# Patient Record
Sex: Male | Born: 1945 | Race: White | Hispanic: No | Marital: Married | Smoking: Former smoker
Health system: Southern US, Community
[De-identification: ages and names within clinical notes are randomized; demographics above are authoritative.]

## PROBLEM LIST (undated history)

## (undated) DIAGNOSIS — E781 Pure hyperglyceridemia: Secondary | ICD-10-CM

## (undated) DIAGNOSIS — K219 Gastro-esophageal reflux disease without esophagitis: Secondary | ICD-10-CM

## (undated) DIAGNOSIS — L03114 Cellulitis of left upper limb: Secondary | ICD-10-CM

## (undated) DIAGNOSIS — E669 Obesity, unspecified: Secondary | ICD-10-CM

## (undated) DIAGNOSIS — R011 Cardiac murmur, unspecified: Secondary | ICD-10-CM

## (undated) DIAGNOSIS — R0989 Other specified symptoms and signs involving the circulatory and respiratory systems: Secondary | ICD-10-CM

## (undated) DIAGNOSIS — Z952 Presence of prosthetic heart valve: Secondary | ICD-10-CM

## (undated) DIAGNOSIS — J8410 Pulmonary fibrosis, unspecified: Secondary | ICD-10-CM

## (undated) DIAGNOSIS — K648 Other hemorrhoids: Secondary | ICD-10-CM

## (undated) DIAGNOSIS — IMO0002 Reserved for concepts with insufficient information to code with codable children: Secondary | ICD-10-CM

## (undated) DIAGNOSIS — I447 Left bundle-branch block, unspecified: Secondary | ICD-10-CM

## (undated) DIAGNOSIS — M1710 Unilateral primary osteoarthritis, unspecified knee: Secondary | ICD-10-CM

## (undated) DIAGNOSIS — M1990 Unspecified osteoarthritis, unspecified site: Secondary | ICD-10-CM

## (undated) DIAGNOSIS — K649 Unspecified hemorrhoids: Secondary | ICD-10-CM

## (undated) DIAGNOSIS — R0609 Other forms of dyspnea: Secondary | ICD-10-CM

## (undated) HISTORY — DX: Other forms of dyspnea: R06.09

## (undated) HISTORY — DX: Left bundle-branch block, unspecified: I44.7

## (undated) HISTORY — DX: Other specified symptoms and signs involving the circulatory and respiratory systems: R09.89

## (undated) HISTORY — DX: Unilateral primary osteoarthritis, unspecified knee: M17.10

## (undated) HISTORY — DX: Pure hyperglyceridemia: E78.1

## (undated) HISTORY — DX: Reserved for concepts with insufficient information to code with codable children: IMO0002

## (undated) HISTORY — DX: Obesity, unspecified: E66.9

## (undated) HISTORY — DX: Presence of prosthetic heart valve: Z95.2

## (undated) HISTORY — PX: JOINT REPLACEMENT: SHX530

## (undated) HISTORY — PX: AORTIC VALVE REPLACEMENT: SHX41

## (undated) HISTORY — PX: OTHER SURGICAL HISTORY: SHX169

## (undated) HISTORY — DX: Other hemorrhoids: K64.8

---

## 2000-09-03 ENCOUNTER — Encounter: Payer: Self-pay | Admitting: Cardiothoracic Surgery

## 2000-09-03 ENCOUNTER — Inpatient Hospital Stay (HOSPITAL_COMMUNITY): Admission: RE | Admit: 2000-09-03 | Discharge: 2000-09-07 | Payer: Self-pay | Admitting: Cardiothoracic Surgery

## 2000-09-03 HISTORY — PX: PACEMAKER REMOVAL: SHX5066

## 2002-02-24 DIAGNOSIS — K648 Other hemorrhoids: Secondary | ICD-10-CM

## 2002-02-24 HISTORY — DX: Other hemorrhoids: K64.8

## 2004-01-13 ENCOUNTER — Emergency Department (HOSPITAL_COMMUNITY): Admission: EM | Admit: 2004-01-13 | Discharge: 2004-01-13 | Payer: Self-pay | Admitting: Emergency Medicine

## 2004-04-08 ENCOUNTER — Ambulatory Visit: Payer: Self-pay | Admitting: Internal Medicine

## 2004-04-29 ENCOUNTER — Ambulatory Visit: Payer: Self-pay | Admitting: Cardiology

## 2004-05-21 ENCOUNTER — Ambulatory Visit: Payer: Self-pay | Admitting: Cardiology

## 2004-06-10 ENCOUNTER — Ambulatory Visit: Payer: Self-pay | Admitting: Internal Medicine

## 2004-07-08 ENCOUNTER — Ambulatory Visit: Payer: Self-pay | Admitting: Internal Medicine

## 2004-08-05 ENCOUNTER — Ambulatory Visit: Payer: Self-pay | Admitting: Internal Medicine

## 2004-09-03 ENCOUNTER — Ambulatory Visit: Payer: Self-pay | Admitting: Cardiology

## 2004-10-01 ENCOUNTER — Ambulatory Visit: Payer: Self-pay | Admitting: Cardiovascular Disease

## 2004-10-01 ENCOUNTER — Ambulatory Visit: Payer: Self-pay | Admitting: Cardiology

## 2004-10-28 ENCOUNTER — Ambulatory Visit: Payer: Self-pay | Admitting: Cardiology

## 2004-11-13 ENCOUNTER — Ambulatory Visit: Payer: Self-pay | Admitting: Internal Medicine

## 2004-11-14 ENCOUNTER — Ambulatory Visit (HOSPITAL_COMMUNITY): Admission: RE | Admit: 2004-11-14 | Discharge: 2004-11-15 | Payer: Self-pay | Admitting: Orthopedic Surgery

## 2004-11-14 HISTORY — PX: SHOULDER ARTHROSCOPY: SHX128

## 2004-11-20 ENCOUNTER — Ambulatory Visit: Payer: Self-pay | Admitting: Cardiology

## 2004-11-26 ENCOUNTER — Ambulatory Visit: Payer: Self-pay | Admitting: Cardiology

## 2004-12-17 ENCOUNTER — Ambulatory Visit: Payer: Self-pay | Admitting: Cardiology

## 2004-12-31 ENCOUNTER — Ambulatory Visit: Payer: Self-pay | Admitting: Cardiology

## 2005-01-14 ENCOUNTER — Ambulatory Visit: Payer: Self-pay | Admitting: Cardiology

## 2005-02-10 ENCOUNTER — Ambulatory Visit: Payer: Self-pay | Admitting: Cardiology

## 2005-02-17 ENCOUNTER — Ambulatory Visit: Payer: Self-pay | Admitting: Internal Medicine

## 2005-03-03 ENCOUNTER — Ambulatory Visit: Payer: Self-pay | Admitting: Internal Medicine

## 2005-04-07 ENCOUNTER — Ambulatory Visit: Payer: Self-pay | Admitting: Cardiology

## 2005-04-21 ENCOUNTER — Ambulatory Visit: Payer: Self-pay | Admitting: Cardiology

## 2005-05-06 ENCOUNTER — Ambulatory Visit: Payer: Self-pay | Admitting: Internal Medicine

## 2005-06-03 ENCOUNTER — Ambulatory Visit: Payer: Self-pay | Admitting: *Deleted

## 2005-07-01 ENCOUNTER — Ambulatory Visit: Payer: Self-pay | Admitting: Cardiology

## 2005-07-22 ENCOUNTER — Ambulatory Visit: Payer: Self-pay

## 2005-08-28 ENCOUNTER — Ambulatory Visit: Payer: Self-pay | Admitting: Cardiology

## 2005-10-09 ENCOUNTER — Ambulatory Visit: Payer: Self-pay | Admitting: Cardiology

## 2005-10-20 ENCOUNTER — Ambulatory Visit: Payer: Self-pay | Admitting: Cardiology

## 2005-11-10 ENCOUNTER — Ambulatory Visit: Payer: Self-pay | Admitting: Cardiology

## 2005-11-21 ENCOUNTER — Ambulatory Visit: Payer: Self-pay | Admitting: Internal Medicine

## 2005-12-11 ENCOUNTER — Ambulatory Visit: Payer: Self-pay | Admitting: Cardiology

## 2005-12-22 ENCOUNTER — Ambulatory Visit: Payer: Self-pay | Admitting: Cardiovascular Disease

## 2005-12-24 ENCOUNTER — Ambulatory Visit (HOSPITAL_COMMUNITY): Admission: RE | Admit: 2005-12-24 | Discharge: 2005-12-24 | Payer: Self-pay | Admitting: Family Medicine

## 2006-01-19 ENCOUNTER — Ambulatory Visit: Payer: Self-pay | Admitting: Cardiology

## 2006-02-02 ENCOUNTER — Ambulatory Visit: Payer: Self-pay | Admitting: Cardiology

## 2006-02-23 ENCOUNTER — Ambulatory Visit: Payer: Self-pay | Admitting: Cardiology

## 2006-03-17 ENCOUNTER — Ambulatory Visit: Payer: Self-pay | Admitting: Cardiology

## 2006-04-06 ENCOUNTER — Ambulatory Visit: Payer: Self-pay | Admitting: Cardiovascular Disease

## 2006-04-15 ENCOUNTER — Ambulatory Visit: Payer: Self-pay

## 2006-05-01 ENCOUNTER — Ambulatory Visit (HOSPITAL_COMMUNITY): Admission: RE | Admit: 2006-05-01 | Discharge: 2006-05-01 | Payer: Self-pay | Admitting: Urology

## 2006-05-01 HISTORY — PX: CIRCUMCISION: SUR203

## 2007-01-27 ENCOUNTER — Ambulatory Visit: Payer: Self-pay | Admitting: Cardiovascular Disease

## 2007-02-01 ENCOUNTER — Ambulatory Visit: Payer: Self-pay | Admitting: Cardiology

## 2007-02-01 ENCOUNTER — Ambulatory Visit (HOSPITAL_COMMUNITY): Admission: RE | Admit: 2007-02-01 | Discharge: 2007-02-01 | Payer: Self-pay | Admitting: Cardiovascular Disease

## 2007-08-25 ENCOUNTER — Ambulatory Visit: Payer: Self-pay | Admitting: Cardiovascular Disease

## 2007-09-01 ENCOUNTER — Ambulatory Visit: Payer: Self-pay | Admitting: Cardiovascular Disease

## 2007-09-01 ENCOUNTER — Encounter (HOSPITAL_COMMUNITY): Admission: RE | Admit: 2007-09-01 | Discharge: 2007-10-01 | Payer: Self-pay | Admitting: Cardiovascular Disease

## 2007-09-30 ENCOUNTER — Ambulatory Visit: Payer: Self-pay | Admitting: Cardiovascular Disease

## 2007-09-30 ENCOUNTER — Ambulatory Visit (HOSPITAL_COMMUNITY): Admission: RE | Admit: 2007-09-30 | Discharge: 2007-09-30 | Payer: Self-pay | Admitting: Cardiovascular Disease

## 2007-10-14 ENCOUNTER — Ambulatory Visit (HOSPITAL_COMMUNITY): Admission: RE | Admit: 2007-10-14 | Discharge: 2007-10-14 | Payer: Self-pay | Admitting: Family Medicine

## 2007-11-04 ENCOUNTER — Inpatient Hospital Stay (HOSPITAL_BASED_OUTPATIENT_CLINIC_OR_DEPARTMENT_OTHER): Admission: RE | Admit: 2007-11-04 | Discharge: 2007-11-04 | Payer: Self-pay | Admitting: Cardiovascular Disease

## 2007-11-04 ENCOUNTER — Ambulatory Visit: Payer: Self-pay | Admitting: Cardiovascular Disease

## 2008-02-07 ENCOUNTER — Encounter: Payer: Self-pay | Admitting: Cardiovascular Disease

## 2008-02-07 ENCOUNTER — Ambulatory Visit: Payer: Self-pay | Admitting: Cardiovascular Disease

## 2008-02-07 ENCOUNTER — Ambulatory Visit: Payer: Self-pay

## 2008-02-18 ENCOUNTER — Ambulatory Visit: Payer: Self-pay | Admitting: Internal Medicine

## 2008-02-18 ENCOUNTER — Ambulatory Visit (HOSPITAL_COMMUNITY): Admission: RE | Admit: 2008-02-18 | Discharge: 2008-02-18 | Payer: Self-pay | Admitting: Cardiovascular Disease

## 2008-11-24 DIAGNOSIS — Z952 Presence of prosthetic heart valve: Secondary | ICD-10-CM | POA: Insufficient documentation

## 2008-11-24 DIAGNOSIS — M1990 Unspecified osteoarthritis, unspecified site: Secondary | ICD-10-CM | POA: Insufficient documentation

## 2008-11-24 DIAGNOSIS — I447 Left bundle-branch block, unspecified: Secondary | ICD-10-CM | POA: Insufficient documentation

## 2008-11-24 DIAGNOSIS — J849 Interstitial pulmonary disease, unspecified: Secondary | ICD-10-CM | POA: Insufficient documentation

## 2008-11-27 ENCOUNTER — Ambulatory Visit: Payer: Self-pay | Admitting: Cardiovascular Disease

## 2009-01-24 ENCOUNTER — Encounter (INDEPENDENT_AMBULATORY_CARE_PROVIDER_SITE_OTHER): Payer: Self-pay | Admitting: *Deleted

## 2009-03-07 ENCOUNTER — Telehealth (INDEPENDENT_AMBULATORY_CARE_PROVIDER_SITE_OTHER): Payer: Self-pay | Admitting: *Deleted

## 2009-03-23 ENCOUNTER — Encounter (INDEPENDENT_AMBULATORY_CARE_PROVIDER_SITE_OTHER): Payer: Self-pay | Admitting: *Deleted

## 2009-10-18 ENCOUNTER — Ambulatory Visit: Payer: Self-pay | Admitting: Cardiovascular Disease

## 2009-10-18 DIAGNOSIS — I10 Essential (primary) hypertension: Secondary | ICD-10-CM | POA: Insufficient documentation

## 2009-12-20 ENCOUNTER — Encounter: Payer: Self-pay | Admitting: Cardiovascular Disease

## 2010-02-12 ENCOUNTER — Telehealth (INDEPENDENT_AMBULATORY_CARE_PROVIDER_SITE_OTHER): Payer: Self-pay | Admitting: *Deleted

## 2010-02-18 ENCOUNTER — Telehealth (INDEPENDENT_AMBULATORY_CARE_PROVIDER_SITE_OTHER): Payer: Self-pay | Admitting: *Deleted

## 2010-04-12 ENCOUNTER — Ambulatory Visit: Payer: Self-pay | Admitting: Cardiovascular Disease

## 2010-06-11 NOTE — Progress Notes (Signed)
  Cath,Echo,Lov faxed to [facility] to [facility] @ [phone] [facility]  February 18, 2010 9:28 AM

## 2010-06-11 NOTE — Progress Notes (Signed)
  Phone Note Other Incoming   Caller: pt Summary of Call: pt having total knee at [facility] and need to hold his coumadin prior to the procedure and pt wants to make sure nothing else needs to be done off the coumadin. will foward for dr Eden Emms review Deliah Goody, RN  February 12, 2010 9:15 AM   Follow-up for Phone Call        Should be ok off coumadin with no lovenox for 4-5 days without lovenox as INR drifts down. Start coumadin back up immediatly post op with lovenox coverage post surgery Follow-up by: Colon Branch, MD, [facility],  February 12, 2010 9:04 PM     Appended Document:  pt aware

## 2010-06-11 NOTE — Assessment & Plan Note (Signed)
Summary: f1y/per spouse call/lg   Primary Provider:  Dr. Blanchard Kelch  CC:  sinus. Pt stopped taken Toprol.  History of Present Illness: Bandy is seen today for F.U of AVR in 1996 he had a cath in 2009 with normal cors.  His weight is up again 12 lbs and he contnues to have poor diet with lots of bread and carbs.  He denies SSCP, dyspnea, edema or syncope.  His INR has been Rx.  We talked about Pradaxa but I did not encourage it due to inability to reverse it.  He stopped taking his Toprol two months ago.  He thinks his palpitations and skipped beats were from Darvocet which was taken off the market.  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 Take 1 Capsule By Mouth Once A Day 2)  Lipitor 80 Mg Tabs (Atorvastatin Calcium) .... Take 1 Tablet By Mouth Once A Day 3)  Niaspan 500 Mg Cr-Tabs (Niacin (Antihyperlipidemic)) .Marland Kitchen.. 1 Tablet By Mouth At Bedtime 4)  Quinapril-Hydrochlorothiazide 20-12.5 Mg Tabs (Quinapril-Hydrochlorothiazide) .... Take 1 Tablet By Mouth Twice A Day 5)  Tricor 145 Mg Tabs (Fenofibrate) .... Take 1 Tablet By Mouth Once A Day 6)  Aspirin 81 Mg Tbec (Aspirin) .... Take One Tablet By Mouth Daily 7)  Multivitamins   Tabs (Multiple Vitamin) .Marland Kitchen.. 1 Tab By Mouth Once Daily 8)  Warfarin Sodium 10 Mg Tabs (Warfarin Sodium) .... As Directed 9)  Mirapex 0.25 Mg Tabs (Pramipexole Dihydrochloride) .... Once Daily  Allergies (verified): No Known Drug Allergies  Past History:  Past Medical History: Last updated: 11-29-08 HYPERTRIGLYCERIDEMIA/SECONDARY TO POOR DIET  (ICD-272.1) AORTIC VALVE REPLACEMENT, HX OF (ICD-V43.3) LEFT BUNDLE BRANCH BLOCK (ICD-426.3) DYSPNEA ON EXERTION (ICD-786.09) OBESITY/CENTRAL (ICD-278.00) ARTHRITIS, KNEE (ZOX-096.04)    Past Surgical History: Last updated: 11-29-08 Circumcision. SURGEON:  Danae Chen, M.D...05/01/2006 Arthroscopy.Left shoulder.. SURGEON:  Loreta Ave, M.D...11/14/2004 Explantation of a pacemaker generator, followed by removal of both atrial and ventricular pacing leads in a patient who had evidence of pacemaker lead crush as well as pocket infection. 09/03/00 Removal of permanent transvenous pacemaker system.Marland KitchenMarland KitchenSURGEON:  Doylene Canning. Ladona Ridgel, M.D.. 09/03/00  Family History: Last updated: 11/29/2008 Father: deceased at 57 of unknown causes Mother: deceased at 90 w/uterine ca  Social History: Last updated: 11-29-08 Tobacco Use - No.  Alcohol Use - yes.Burnard Bunting wine nightly Drug Use - no  Review of Systems       Denies fever, malais, weight loss, blurry vision, decreased visual acuity, cough, sputum, SOB, hemoptysis, pleuritic pain, palpitaitons, heartburn, abdominal pain, melena, lower extremity edema, claudication, or rash.   Vital Signs:  Patient profile:   65 year old male Height:      67 inches Weight:      217 pounds BMI:     34.11 Pulse rate:   69 / minute Resp:     12 per minute BP sitting:   130 / 80  (left arm)  Vitals Entered By: Kem Parkinson (October 18, 2009 9:40 AM)  Physical Exam  General:  Affect appropriate Healthy:  appears stated age HEENT: normal Neck supple  with no adenopathy JVP normal no bruits no thyromegaly Lungs clear with no wheezing and good diaphragmatic motion Heart:  S1/S2 click  no murmur,rub, gallop or click PMI normal Abdomen: benighn, BS positve, no tenderness, no AAA no bruit.  No HSM or HJR Distal pulses intact with no bruits No edema Neuro non-focal Skin warm and dry    Impression & Recommendations:  Problem # 1:  AORTIC VALVE  REPLACEMENT, HX OF (ICD-V43.3) Normal sounding valve and no symptoms.  SBE prophylaxis  Problem # 2:  HYPERTRIGLYCERIDEMIA/SECONDARY TO POOR DIET (ICD-272.1) Low carb diet and weidht loss plan discussed.  He eats way to much "junk" food after 8:00 pm The following medications were removed from the medication list:    Lipitor 80 Mg Tabs (Atorvastatin calcium) .Marland Kitchen... Take 1 tablet by mouth once a day    Niaspan 500 Mg Cr-tabs (Niacin (antihyperlipidemic)) .Marland Kitchen... 1 tablet by mouth at bedtime    Tricor 145 Mg Tabs (Fenofibrate) .Marland Kitchen... Take 1 tablet by mouth once a day His updated medication list for this problem includes:    Lipitor 80 Mg Tabs (Atorvastatin calcium) .Marland Kitchen... Take 1 tablet by mouth once a day    Niaspan 500 Mg Cr-tabs (Niacin (antihyperlipidemic)) .Marland Kitchen... 1 tablet by mouth at bedtime    Tricor 145 Mg Tabs (Fenofibrate) .Marland Kitchen... Take 1 tablet by mouth once a day  Problem # 3:  ESSENTIAL HYPERTENSION, BENIGN (ICD-401.1) Toprol stopped 2 months ago due to dizzyness.  HR and BP ok off BB.  Monitor on ACE  The following medications were removed from the medication list:    Toprol Xl 25 Mg Xr24h-tab (Metoprolol succinate) .Marland Kitchen... 1/2 tablet by mouth twice a day    Lopressor 50 Mg Tabs (Metoprolol tartrate) .Marland Kitchen... Take 1/2 tablet by mouth twice a day    Quinapril-hydrochlorothiazide 20-12.5 Mg Tabs (Quinapril-hydrochlorothiazide) .Marland Kitchen... Take 1 tablet by mouth twice a day His updated medication list for this problem includes:    Quinapril-hydrochlorothiazide 20-12.5 Mg Tabs (Quinapril-hydrochlorothiazide) .Marland Kitchen... Take 1 tablet by mouth twice a day    Aspirin 81 Mg Tbec (Aspirin) .Marland Kitchen... Take one tablet by mouth daily  Problem # 4:  COUMADIN THERAPY (ICD-V58.61) INR's have been Rx with no bleeding problems.  Discussed Pradaxa and we all agree coumadin is better at this time  Other Orders: EKG w/ Interpretation (93000)  Patient Instructions: 1)  Your physician recommends that you schedule a follow-up  appointment in: 6 MONTHS WITH DR Eden Emms 2)  Your physician recommends that you continue on your current medications as directed. Please refer to the Current Medication list given to you today.

## 2010-06-11 NOTE — Assessment & Plan Note (Signed)
Summary: 6  mo f/u ./cy   Primary Provider:  Dr. Blanchard Kelch   History of Present Illness: Dale [name] is seen today for F.U of AVR in 1996 he had a cath in 2009 with normal cors.  His weight is up again 12 lbs and he contnues to have poor diet with lots of bread and carbs.  He denies SSCP, dyspnea, edema or syncope.  His INR has been Rx.  We talked about Pradaxa but I did not encourage it due to inability to reverse it.  He stopped taking his Toprol two months ago.  He thinks his palpitations and skipped beats were from Darvocet which was taken off the market.  He continues to have problems with his knees and will likely need a replacement in the next 3 years  Current Medications (verified): 1)  Celebrex 200 Mg Caps (Celecoxib) .... Take 1 Capsule By Mouth Once A Day 2)  Lipitor 80 Mg Tabs (Atorvastatin Calcium) .... Take 1 Tablet By Mouth Once A Day 3)  Niaspan 500 Mg Cr-Tabs (Niacin (Antihyperlipidemic)) .Marland Kitchen.. 1 Tablet By Mouth At Bedtime 4)  Quinapril-Hydrochlorothiazide 20-12.5 Mg Tabs (Quinapril-Hydrochlorothiazide) .... Take 1 Tablet By Mouth Twice A Day 5)  Tricor 145 Mg Tabs (Fenofibrate) .... Take 1 Tablet By Mouth Once A Day 6)  Aspirin 81 Mg Tbec (Aspirin) .... Take One Tablet By Mouth Daily 7)  Multivitamins   Tabs (Multiple Vitamin) .Marland Kitchen.. 1 Tab By Mouth Once Daily 8)  Warfarin Sodium 10 Mg Tabs (Warfarin Sodium) .... As Directed 9)  Mirapex 0.25 Mg Tabs (Pramipexole Dihydrochloride) .... Once Daily  Allergies: No Known Drug Allergies  Vital Signs:  Patient profile:   65 year old male Height:      67 inches Weight:      205 pounds BMI:     32.22 Pulse rate:   80 / minute Resp:     14 per minute BP sitting:   124 / 76  (left arm)  Vitals Entered By: Kem Parkinson (April 12, 2010 3:24 PM)  Physical Exam  General:  Affect appropriate Healthy:  appears stated age HEENT: normal Neck supple with no adenopathy JVP normal no bruits no thyromegaly Lungs clear with no wheezing  and good diaphragmatic motion Heart:  S1/S2 click with sytolic   murmur,rub, gallop or click PMI normal Abdomen: benighn, BS positve, no tenderness, no AAA no bruit.  No HSM or HJR Distal pulses intact with no bruits No edema Neuro non-focal Skin warm and dry    Impression & Recommendations:  Problem # 1:  ESSENTIAL HYPERTENSION, BENIGN (ICD-401.1) Well contorlled His updated medication list for this problem includes:    Quinapril-hydrochlorothiazide 20-12.5 Mg Tabs (Quinapril-hydrochlorothiazide) .Marland Kitchen... Take 1 tablet by mouth twice a day    Aspirin 81 Mg Tbec (Aspirin) .Marland Kitchen... Take one tablet by mouth daily  Problem # 2:  COUMADIN THERAPY (ICD-V58.61) INR's Rx with no bleeding problems  Followed by primary not our clinic  Problem # 3:  HYPERTRIGLYCERIDEMIA/SECONDARY TO POOR DIET (ICD-272.1) Labs per primary.  NO side effects  Diet much improved with weight loss noted His updated medication list for this problem includes:    Lipitor 80 Mg Tabs (Atorvastatin calcium) .Marland Kitchen... Take 1 tablet by mouth once a day    Niaspan 500 Mg Cr-tabs (Niacin (antihyperlipidemic)) .Marland Kitchen... 1 tablet by mouth at bedtime    Tricor 145 Mg Tabs (Fenofibrate) .Marland Kitchen... Take 1 tablet by mouth once a day  Problem # 4:  AORTIC VALVE REPLACEMENT, HX OF (  ICD-V43.3) Normal sounding valve click  SBE prohyplaxis  Patient Instructions: 1)  Your physician recommends that you schedule a follow-up appointment in: 6 MONTHS WITH DR Eden Emms 2)  Your physician recommends that you continue on your current medications as directed. Please refer to the Current Medication list given to you today. Prescriptions: CELEBREX 200 MG CAPS (CELECOXIB) Take 1 capsule by mouth once a day  #90 x 4   Entered by:   Scherrie Bateman, LPN   Authorized by:   Colon Branch, MD, [facility]   Signed by:   Scherrie Bateman, LPN on 04/54/0981   Method used:   Faxed to ...       [facility] (mail-order)       [address] [place], [place]   [address]       Ph: [phone]       Fax: [phone]   RxID:   [id number] TRICOR 145 MG TABS (FENOFIBRATE) Take 1 tablet by mouth once a day  #90 x 3   Entered by:   Scherrie Bateman, LPN   Authorized by:   Colon Branch, MD, [facility]   Signed by:   Scherrie Bateman, LPN on 36/64/4034   Method used:   Faxed to ...       [facility] (mail-order)       [address] [place], [place]  [address]       Ph: [phone]       Fax: [phone]   RxID:   [id number] QUINAPRIL-HYDROCHLOROTHIAZIDE 20-12.5 MG TABS (QUINAPRIL-HYDROCHLOROTHIAZIDE) Take 1 tablet by mouth twice a day  #180 x 4   Entered by:   Scherrie Bateman, LPN   Authorized by:   Colon Branch, MD, [facility]   Signed by:   Scherrie Bateman, LPN on 32/20/2542   Method used:   Faxed to ...       [facility] (mail-order)       [address] [place], [place]  [address]       Ph: [phone]       Fax: [phone]   RxID:   [id number] NIASPAN 500 MG CR-TABS (NIACIN (ANTIHYPERLIPIDEMIC)) 1 tablet by mouth at bedtime  #90 x 4   Entered by:   Scherrie Bateman, LPN   Authorized by:   Colon Branch, MD, [facility]   Signed by:   Scherrie Bateman, LPN on 81/82/9937   Method used:   Faxed to ...       [facility] (mail-order)       [address] [place], [place]  [address]       Ph: [phone]       Fax: [phone]   RxID:   [id number] LIPITOR 80 MG TABS (ATORVASTATIN CALCIUM) Take 1 tablet by mouth once a day  #90 Tablet x 3   Entered by:   Scherrie Bateman, LPN   Authorized by:   Colon Branch, MD, [facility]   Signed by:   Scherrie Bateman, LPN on 67/61/9509   Method used:   Faxed to ...       [facility] (mail-order)       [address] [place], [place]  [address]       Ph: [phone]       Fax: [phone]   RxID:   [id number]

## 2010-07-31 ENCOUNTER — Other Ambulatory Visit: Payer: Self-pay | Admitting: *Deleted

## 2010-07-31 DIAGNOSIS — E781 Pure hyperglyceridemia: Secondary | ICD-10-CM

## 2010-07-31 MED ORDER — NIACIN ER (ANTIHYPERLIPIDEMIC) 500 MG PO TBCR: 500.0000 mg | EXTENDED_RELEASE_TABLET | Freq: Every day | ORAL | Status: DC

## 2010-09-24 NOTE — Assessment & Plan Note (Signed)
[facility]                       [place] [facility] OFFICE NOTE   NAME:Dale [name], Dale [name]                          MRN:          [id number]  DATE:08/25/2007                            DOB:          July 13, 1945    Dale [name] returns today for followup.  He is status post 13-year St. Jude  aortic valve replacement.   He had heart block and a pacemaker placed afterwards but this was  removed due to infection.  He has not had it replaced.  In talking to  Dale [name], he has a cold, he has some exertional dyspnea and a cough.  He  feels stuffed up.  There has been no significant chest pain.  He has  some exertional dyspnea.  No PND or orthopnea.  No lower extremity  edema.   I talked to him at length about his weight.  He continues to have way  too many calories a day.  He is active at work and fishing, and around  the house, but clearly his intake is too great.  Dale [name] has been  overweight for some time and does not seem to take it seriously.  However, on review of systems he has significant right shoulder pain.  He has had previous arthroscopic surgery there.  He also has bilateral  knee pain.  He has been taking Celebrex on as-needed basis.   It has been quite some time since we have checked him for coronary  disease.  I believe at the time of his valve, he had no coronary  disease, but he needs a followup stress test.   REVIEW OF SYSTEMS:  Otherwise negative.  He is on Celebrex 200 a day,  quinapril 20/12.5, Lipitor 80 a day,  Tricor 145 a day, Niaspan 500 a  day, an aspirin a day.   EXAM:  Is remarkable for an overweight and flushed middle-aged white  male in no distress.  Weight is 228, blood pressure is 118/80, pulse 70 and regular, afebrile.  Affect appropriate.  HEENT:  Unremarkable.  Carotids are without bruit.  No lymphadenopathy, thyromegaly, JVP  elevation.  LUNGS:  Clear with good diaphragmatic motion.  No wheezing.  S1-S2 click with a systolic  murmur.  PMI normal.  Sternotomy well-  healed.  Previous pocket site without device.  ABDOMEN:  Protuberant.  Bowel sounds positive.  No bruit.  No AAA.  No  hepatosplenomegaly.  Distal pulse intact.  No edema.  NEURO:  Nonfocal.  SKIN:  Warm and dry.  No muscular weakness.   IMPRESSION:  1. Previous St. Jude aortic valve replacement.  Followup echo      September 2009.  Last echo shows good valve function, mild LVH,      normal LV function.  2. Anticoagulation.  Followup [facility].  INRs have been      therapeutic.  No bleeding diathesis.  3. Exertional dyspnea likely due to weight.  Check stress Myoview rule      out coronary disease.  Also check for chronotropic incompetence      since he has had a previous pacemaker.  4. Hyperlipidemia.  Continue Lipitor 80 a day.  Lipid liver profile in      6 months.  5. Hypertriglyceridemia, again related to extremely poor diet.      Continue Niaspan and Tricor.  May need a nutrition consult but Dale [name]      knows what he is doing wrong with his diet.  6. Arthritis.  Follow up Dale [name].  May need x-rays of his right      shoulder even though his pacemaker was taken out, he still has a      wire in place and cannot have an MRI.   I will see Dale [name] when he comes back for a stress test and he will have a  followup echo in September.     Noralyn Pick. Eden Emms, MD, [facility]  Electronically Signed    PCN/MedQ  DD: 08/25/2007  DT: 08/25/2007  Job #: [id number]

## 2010-09-24 NOTE — Assessment & Plan Note (Signed)
[facility]                       [place] [facility] OFFICE NOTE   NAME:Dale [name], Dale [name]                        MRN:          [id number]  DATE:01/27/2007                            DOB:          01/18/1944    Dale [name] returns today for followup.  He has a 63 year old St. Jude aortic  valve in place.  He had a previous pacemaker postoperatively that was  removed in 2002, I believe due to infection.  It was never replaced.  It  was somewhat of a difficult procedure.   He has hypercholesterolemia, hypertriglyceridemia, and central obesity.   He has been doing well.  He has had decreased LV function with EF's in  the 43-49% range.  He has had no previously documented coronary disease  and a nonischemic Myoview in 2007.  He continues to have some mild  exertional dyspnea which is related to his weight.  His weight is  related to poor exercise activity and significant increase in appetite.   I have talked to Koah multiple times about this, but it is really  difficult for him not to eat too much.   He had lipids drawn; however, as far as I could tell they were not  fasting.  His LDL cholesterol was excellent at 58.  His triglycerides  were over 300 in a nonfasting specimen.  His LFTs were normal.  He has  not had any significant chest pain or syncope.  There have been no  problems with his Coumadin therapy.   His review of systems is remarkable for some mild pain behind both  knees.  It sounds arthritic, but I told him that he could take a Lipitor  holiday for 4-6 weeks to see if this helps.   His dentition is in good shape.  He sees a Education officer, community twice a year.  He  continues to do SBE prophylaxis.   CURRENT MEDICATIONS:  1. Coumadin as directed.  2. Niaspan.  3. Aspirin daily.  4. Accuretic.  5. Celebrex.  6. Tricor.  7. Lipitor 80 a day.   PHYSICAL EXAMINATION:  Exam is remarkable for an overweight middle-aged  white male in no distress.  He has a  red complexion from being outside  all of the time.  Weight is 225.  Affect is appropriate.  BP is 114/72, pulse 78 and  regular.  Respiratory rate is 16.  He is afebrile.  HEENT:  Normal.  Carotids are normal without bruits.  There is no lymphadenopathy, no  thyromegaly, no JVP elevation.  LUNGS:  Clear with good diaphragmatic motion, no wheezing.  There is an S1 and S2 click with a soft systolic murmur.  There is no  AI.  Sternum is well healed.  PMI is normal.  ABDOMEN:  Benign.  Bowel sounds are positive.  There is no  hepatosplenomegaly.  No hepatojugular reflux.  No tenderness.  No  bruits.  Femorals are +3 bilaterally, and DPTs are +3.  There is no lower extremity edema.  NEURO:  Nonfocal.  SKIN:  Warm and dry.  There is no muscular weakness.   His EKG  shows sinus rhythm with first-degree heart block.   IMPRESSION:  1. Aortic valve replacement, currently stable.  Follow up echo to      assess since it is 65 years old.  Continue SBE prophylaxis.  2. History of heart block postoperatively with previous pacemaker that      was removed, currently sinus rhythm with no high-grade heart block.      No indication for resumption of pacemaker.  3. Hypertriglyceridemia and hyperlipidemia.  Continue Niaspan, Tricor,      and Lipitor.  LFTs are normal.  He will hold his Lipitor for 4-6      weeks if he thinks it will help with his knee pain.  However, this      sounds more arthritic.  4. Central obesity.  Continue to try to cut down on caloric intake and      increase activity level.  He understands that this is a significant      problem for him.  He has been referred to a nutritionist in the      past.  5. Arthritis:  The patient has been put on Celebrex by his primary      care MD.  He does not have documented coronary disease.  I do not      think he is particularly at high risk.  If this helps his      arthritis, I am fine with him taking it.   He will have his 2D  echocardiogram in the next few weeks.  Otherwise, I  will see him back in six months.  I think we can wait a year to do a  stress test since he had one on 2007.     Noralyn Pick. Eden Emms, MD, [facility]  Electronically Signed    PCN/MedQ  DD: 01/27/2007  DT: 01/28/2007  Job #: [id number]

## 2010-09-24 NOTE — Procedures (Signed)
NAMEJESSICA, SEIDMAN                 ACCOUNT NO.:  [id number]   MEDICAL RECORD NO.:  [id number]          PATIENT TYPE:  OUT   LOCATION:  RAD                           FACILITY:  [facility]   PHYSICIAN:  Gerrit Friends. Dietrich Pates, MD, FACCDATE OF BIRTH:  01/18/1944   DATE OF PROCEDURE:  02/01/2007  DATE OF DISCHARGE:                                ECHOCARDIOGRAM   REFERRING:  Noralyn Pick. Eden Emms, MD.   CLINICAL DATA:  A 65 year old gentleman with a history of aortic  stenosis and subsequent aortic valve replacement surgery with a St. Jude  device.   M-mode:  Aorta 2.7, left atrium 4.8, septum 1.5, posterior wall 1.3, LV  diastole 5.1, LV systole 4.0.   1.  2. Mild left atrial enlargement; right atrial size at the upper limit      of normal.  3. Mild right ventricular dilatation with borderline hypertrophy and      normal systolic function.  4. Normal aortic diameter at the aortic annulus; the supravalvular      aorta may be mildly dilated.  5. A prosthetic valve is present in the aortic position but is not      ideally imaged; Doppler suggests no significant valve dysfunction.  6. Normal pulmonic valve and proximal pulmonary artery.  7. Normal tricuspid valve with physiologic regurgitation.  8. Normal mitral valve; moderate annular calcification; mild      regurgitation.  9. Normal aortic arch.  10.Normal left ventricular size; mild hypertrophy; some hypokinesis of      the inferoseptal segment with low normal to normal overall LV      systolic function.      Gerrit Friends. Dietrich Pates, MD, [facility]  Electronically Signed     RMR/MEDQ  D:  02/01/2007  T:  02/02/2007  Job:  [id number]

## 2010-09-24 NOTE — Cardiovascular Report (Signed)
NAMELONNEL, GJERDE                 ACCOUNT NO.:  [id number]   MEDICAL RECORD NO.:  [id number]          PATIENT TYPE:  OIB   LOCATION:  1963                         FACILITY:  [facility]   PHYSICIAN:  Peter C. Eden Emms, MD, FACCDATE OF BIRTH:  10/26/1945   DATE OF PROCEDURE:  DATE OF DISCHARGE:  11/04/2007                            CARDIAC CATHETERIZATION   A 65 year old patient with previous aortic valve replacement.   The patient is having increasing shortness of breath.  Myoview suggested  possible inferior wall ischemia.  Ejection fraction is decreased from  45% to 35%.   Today, catheterization was done with a 5-French catheter from right  femoral artery and a 7-French catheter in the right femoral vein.   The JL5 catheter was used to engage the left main.  Williams catheter  was used for the right coronary artery.   The left main coronary artery was normal.  The proximal and distal LAD  were normal.   First diagonal branch was normal.   The patient had a large septal perforator which was normal.   Circumflex coronary artery was a large artery.  There were 2 obtuse  marginal branches.  It was normal.   The right coronary artery was dominant.  Proximal mid and distal RCA  were normal.  There was a small eccentric 30% lesion proximally.   Fluoroscopy of the aortic valve showed it to be a bileaflet [employer] Jude,  both leaflets move normally.   LAO aortography was done due to the patient's large aortic root.  He had  moderate aortic root dilatation with mild periprosthetic leak with a  grade 1/4 aortic insufficiency.   Right heart catheterization was done due to the patient's dyspnea.   Mean pulmonary catheter wedge pressure was 17, PA pressure was 36/14, RV  pressure was 40/13, mean right atrial pressure was 13, aortic pressure  was 130/64.  Aortic valve was not crossed due to the mechanical nature  of it.   IMPRESSION:  1. The patient does not have significant coronary  disease.  I suspect      his Myoview represented diaphragmatic attenuation.  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  He      desperately needs to lose weight.  He was encouraged to follow the      [employer].  2. Moderate aortic root dilatation.  This may be more of an issue in      the future than anything else.  I will have to go back to my      records and see when the last time we incised it.  He will need a      followup CT or MRI in the next 6 months to further assess aortic      root size.   The patient was told to restart his Coumadin tonight so long as his  groin looks good.  He will follow up in our Coumadin Clinic in  [place] on Monday.  I will see him back in 3 months.      Noralyn Pick. Eden Emms, MD, [facility]  Electronically Signed     PCN/MEDQ  D:  11/04/2007  T:  11/05/2007  Job:  [id number]

## 2010-09-24 NOTE — Assessment & Plan Note (Signed)
[facility]                        CARDIOLOGY OFFICE NOTE   NAME:Racine, MANDELL                          MRN:          [id number]  DATE:09/30/2007                            DOB:          [birth year]    Dale [name] returns today for follow-up.   His Myoview study just showed a decrease in LV function to the 38%  range.   He had some thinning of the inferior wall that I thought was consistent  with diaphragmatic attenuation, but an inferior wall infarct cannot be  totally ruled out.   Unfortunately Dale [name] continues to eat poorly.  His weight is up to 230  pounds.  He has not been under 200 pounds for six years.  He is fairly  sedentary and eats quite a bit at night including high carbohydrate  snacks.   I told Dale [name] that I was a little bit concerned about his ejection  fraction changing by about 10%.   Prior to his aortic valve replacement he had a bit of cardiomyopathy,  and his EF had been in the 40-45% range.   He has significant fatigue and exertional dyspnea.   I would like to get him on an exercise program.  I think his Myoview is  abnormal enough with his symptoms of fatigue and shortness of breath  that we should do a heart cath.  Will have to stop his Coumadin five  days before.  We will do a right and left heart cath.  I would like to  clear him for an exercise program and to lose at least 30 pounds.   He is not having significant chest pain.   REVIEW OF SYSTEMS:  Otherwise, remarkable for some mild lower extremity  edema.  He has been compliant with his meds.   ALLERGIES:  He has no known allergies.   He previously had a pacemaker placed during the time of his valve  surgery.  However, it was infected and removed in April 2002, and it has  not had to be replaced.   His baseline EKG is essentially normal with sinus rhythm and first  degree AV block with PR interval at 242.   MEDICATIONS:  1. Celebrex 200 a day.  2.  Quinapril 20/12.5 two tablets a day.  3. Lipitor 80 a day.  4. Tricor 145 a day.  5. Niaspan 500 a day.  6. Aspirin a day.  7. Warfarin as directed.  8. Toprol 1/2 tablet b.i.d.   PHYSICAL EXAMINATION:  GENERAL:  Remarkable for an overweight white male  in no distress.  VITAL SIGNS:  Weight is 230, blood pressure 122/77, pulse 64 and  regular, afebrile, respiratory rate 14.  GENERAL:  Affect appropriate.  HEENT:  Unremarkable.  Carotids normal without bruit.  No  lymphadenopathy, thyromegaly or JVP elevation.  LUNGS:  Clear with good diaphragmatic motion.  No wheezing.  HEART:  S1, S2 click, with systolic ejection murmur.  No AI.  PMI  normal.  ABDOMEN:  Benign, bowel sounds positive.  No AAA, no tenderness.  No  hepatosplenomegaly, hepatic jugular reflexes.  EXTREMITIES:  Pulses are intact with no edema.  NEUROLOGICAL:  Nonfocal.  SKIN:  Warm and dry.  MUSCULOSKELETAL:  No muscular weakness.   IMPRESSION:  1. Aortic valve replacement on Coumadin somewhat of a high gradient by      echocardiogram, but functioning normally with no periprosthetic      leak.  2. Dyspnea and fatigue.  Question anginal equivalent.  Left heart      catheterization to be performed in the next week or two.  3. Dyspnea in the setting of decreased left ventricle function.  We      will rule out coronary disease particularly in light of the fact      that he may need the device.  I would like to make sure his filling      pressures are in a normal range, and that he is on adequate      medication particularly his quinapril HCTZ.  4. Previous need for pacemaker first degree AV block.  No high grade      heart block, chronotropic competence intact.  Would like to avoid      placement of another pacemaker or AICD.  5. Hypertriglyceridemia secondary to poor diet.  Continue Tricor and      Niaspan.  6. 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  Continue      Celebrex.  Will rule out coronary  disease with heart      catheterization.     Noralyn Pick. Eden Emms, MD, [facility]  Electronically Signed    PCN/MedQ  DD: 09/30/2007  DT: 09/30/2007  Job #: [id number]

## 2010-09-24 NOTE — Assessment & Plan Note (Signed)
[facility]                            CARDIOLOGY OFFICE NOTE   NAME:Dale [name], Dale [name]                        MRN:          [id number]  DATE:02/07/2008                            DOB:          April 30, 1946    Gregory returns today for followup.  Unfortunately, he has not lost any  weight.  He continues to be over 220 pounds.  He has a history of aortic  valve replacement.  He has been having exertional dyspnea, some  dizziness.  He has arthritis in his knees.  He and his wife were asking  about disability.  I reviewed his echo today.  His LV function is  actually near normal now.  It has previously been as low as 35%.  He  does have a bit of a gradient across his aortic valve, but I think it is  functioning normally.  I told him that given his current LV function and  the fact that his valve is working appropriately, I was not sure I could  support disability based on his heart.  I think a lot of Nickholas's  functional limitations stem from his weight.  It is likely that he also  has significant sleep apnea and needs a followup sleep study.   Brendt has a poor diet.  He does not appear to be willing to change this.  I explained to him that I do not think that he would feel better until  he lost significant amounts of weight.   He is not having any significant chest pain.  There has been no fevers.  No palpitations.  He is previously had a pacemaker placed.  It was taken  out for question of infection, and he has not required reinsertion.   He has been compliant with his medications.  He is on Celebrex,  quinapril 20/12.5, Lipitor 80 a day, TriCor 145, Niaspan, aspirin a day,  warfarin, and Toprol 12.5 b.i.d.   PHYSICAL EXAMINATION:  VITAL SIGNS:  His exam is remarkable for a weight  of 226, blood pressure 111/77, pulse 68 and regular, respiratory rate  14, afebrile.  HEENT:  Unremarkable.  Carotids have a transmitted murmur.  NECK:  No lymphadenopathy,  thyromegaly, or JVP elevation.  LUNGS:  Clear.  Good diaphragmatic motion.  No wheezing.  HEART:  S1 and S2 with a systolic murmur through the aortic valve.  ABDOMEN:  Benign.  Bowel sounds positive.  No AAA.  No tenderness.  No  bruit.  No hepatosplenomegaly or hepatojugular reflux.  No tenderness.  EXTREMITIES:  Distal pulses are intact with no edema.  NEUROLOGIC:  Nonfocal.  SKIN:  Warm and dry.  No muscular weakness.   He has had a recent heart catheterization given his symptoms and his  pulmonary pressures have not been elevated.  He does not have recurrent  coronary disease and has mild aortic root dilatation.   IMPRESSION:  1. Dyspnea related to weight.  No evidence of congestive heart      failure, previous decreased left ventricular function seems to be      improved by  echo, may need followup MRI, continue current dose of      medication.  2. Aortic valve replacement, functioning well without evidence of      perivalvular leak.  Follow up with echo in a year.  3. History of pacemaker with left bundle-branch block.  No evidence of      chronotropic incompetence or recurrent high-grade heart block.  4. Probable sleep apnea, followup primary care MD, consider sleep      study with continuous positive airway pressure.  5. Central obesity.  This is Alexsander's biggest problem.  Counseling      regarding caloric intake, decreased carbohydrate diet, and [facility] Diet type planned, reinforced with the patient.      Noralyn Pick. Eden Emms, MD, [facility]  Electronically Signed    PCN/MedQ  DD: 02/07/2008  DT: 02/08/2008  Job #: [id number]

## 2010-09-27 NOTE — Op Note (Signed)
NAMEKEIONTE, [name]                 ACCOUNT NO.:  [id number]   MEDICAL RECORD NO.:  [id number]          PATIENT TYPE:  AMB   LOCATION:  DAY                          FACILITY:  [facility]   PHYSICIAN:  Lindaann Slough, M.D.  DATE OF BIRTH:  01/18/1944   DATE OF PROCEDURE:  05/01/2006  DATE OF DISCHARGE:                               OPERATIVE REPORT   PREOPERATIVE DIAGNOSIS:  Phimosis.   POSTOPERATIVE DIAGNOSIS:  Phimosis.   PROCEDURE DONE:  Circumcision.   SURGEON:  Danae Chen, M.D.   ANESTHESIA:  General.   INDICATIONS:  Patient is a 65 year old male who has been complaining of  difficulty retracting his foreskin, and hygiene has been a problem.  He  was found on physical examination to have phimosis.  He is scheduled  today for circumcision.   Under general anesthesia, the patient was prepped and draped and placed  in the supine position.  A penile block was done with 0.25% Marcaine,  then two circumferential incisions were made on the foreskin, and the  foreskin in between those two incisions was excised.  Hemostasis was  secured with electrocautery.  Skin approximation was then done with #4 0  chromic.  The patient tolerated the procedure well and left the OR in  satisfactory condition to post anesthesia care unit.      Lindaann Slough, M.D.  Electronically Signed     MN/MEDQ  D:  05/01/2006  T:  05/01/2006  Job:  [id number]   cc:   Noralyn Pick. Eden Emms, MD, [facility]  [address]  [address]  [place]  [place] [address]

## 2010-09-27 NOTE — Op Note (Signed)
[place]  Patient:    Dale [name], Dale [name]                        MRN: [id number] Proc. Date: 09/03/00 Adm. Date:  [id number] Disc. Date: [id number] Attending:  Lewayne Bunting CC:         Noralyn Pick. Eden Emms, M.D. [facility]  Nathen May, M.D., [facility]  Kathrine Cords, [facility]   Operative Report  PROCEDURE PERFORMED:  Explantation of a pacemaker generator, followed by removal of both atrial and ventricular pacing leads in a patient who had evidence of pacemaker lead crush as well as pocket infection.  I:  INTRODUCTION:  The patient is a 65 year old man who has aortic valve disease and is status post aortic valve replacement.  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 The patient developed swelling and tenderness over his pacemaker pocket approximately one month ago and interrogation of his pacemaker demonstrated ventricular lead dysfunction which appeared to be isolated to the outer coil. Because of suspected infection and his pacemaker lead dysfunction, he is referred for pacemaker and generator lead removal.  II:  PROCEDURE:  After informed consent was obtained, the patient was taken to the operative suite in a fasting state.  After the usual preparation and draping, the anesthesia service was utilized to provide general endotracheal anesthesia.  Lidocaine, 20 cc was infiltrated into the left infraclavicular region over the pacemaker pocket and a 10 cm incision was carried out over the old pacemaker incision and electrocautery utilized to dissect down to the pocket.  With entry into the pocket, there was an approximately 30 to 40 cc of cloudy fluid.  This was not obvious  purulent but did appear to be due to a fairly indolent infection.  The fluid was serous but cloudy in color. Cultures were subsequently sent.  Additional dissection was carried out down to open up the pocket and the old pacemaker generator was removed.  The leads were removed from the generator in the usual fashion and electrocautery used to dissect out the leads.  At this point, it was noted that the patient was not in fact pacemaker dependent.  The initial stylet was placed down both atrial and ventricular leads to the apex.  Following this they were removed and the leads were cut.  The [facility] liberator locking stylet was placed in both atrial and ventricular leads.  The locking mechanism was subsequently activated.  The atrial lead was pulled on with gentle traction and this resulted in it being dislodged from the right atrial appendage and advanced to the subclavian vein/innominate vein junction.  At this location the leads could not be withdrawn further.  The [facility] bipolar powered sheath was then placed over the ventricular lead.  This was the 43 French sheath and attempts were made to access the intravascular space.  The patient had a very large first rib and clavicle and his pacemaker leads were actually wedged into the junction of the first rib and clavicle.  Attempts to gain access to the intravascular space using the active electrocautery sheath were unsuccessful.  Unfortunately the lead began to unravel.  At this point, stainless steel sheaths were inserted over the lead and  despite multiple attempts at multiple angulations and orientations of the sheath.  The sheaths continued to hang up against the patients clavicle first rib junction.  On fluoroscopy it could be seen very easily that rather than passage of the stainless steel sheath over the lead, the passage was limited by the patients first rib impairing progress.  At this point the ventricular lead again had begun to unravel  with break in the insulation.  The leads were at this point secured to the skin and attempt was made to remove the lead with the BYR work station from the right femoral vein.  After the right femoral vein was punctured, the 11 French introducer sheath was placed over the guide wire and advanced into the inferior vena cava.  The introducer sheath was removed and the BYR work station was then inserted and advanced into the right atrium.  This was done under fluoroscopic guidance. In addition, an extra J wire was placed into the inferior vena cava and secured with mosquito clamp.  At this point, the Dotter snare was utilized and the RV lead was secured with the snare and entwined and attempts to remove the lead resulted in the proximal end of the lead being advanced into the right atrium, broken off from the pocket site but the distal portion of the RV lead remained fix to the right ventricle.  Attempts were then made utilizing ablation catheter to grab the lead.  These were unsuccessful.  Finally after multiple attempts the [facility] eye snare was advanced over a 9 French sheath into the right atrium. It should be noted that at this time, the pacemaker lead had actually advanced into the RV outflow track.  There was frequent ventricular ectopy.  The Owls eye snare after multiple attempts was able to secure the patients pacemaker lead.  Careful advancement of the sheath was then carried out and the fibrous binding sites in the right ventricle were lysed and utilizing a combination of traction and counter traction, pressure and pressure the RV lead was removed in total.  At this point, fluoroscopy was utilized to carefully examine the patients cardiac silhouette and other mediastinal structures.  There was no evidence of any retained ventricular lead. In addition there was no evidence of any hemothorax, pericardial effusion or pneumothorax.  At this point, attention was turned to the  residual  right atrial lead which was lodged in the innominate vein, left subclavian vein junction.  Dr. Donata Clay helped me at this point, dissect out the residual atrial lead down to the location below the first rib where the lead dove into the left subclavian vein.  Gentle traction was placed at this site and the residual portion of the atrial lead remained adhered to the vascular space while the remainder of the lead was removed.  At this point, additional attempts were carried out with an Owls eye snare.  This demonstrated that the left subclavian vein was still intact.  It appeared that in fact the residual lead was probably not in the vascular space but bounded by dense fibrous tissue as it could not be snared despite multiple attempts.  At this point, the BYR work station and sheath were all removed and pressure was held over the right femoral region for approximately 20 minutes.  During the same period, Dr. Donata Clay closed up the old pacemaker pocket removing any fibrous capsule.  A small Jackson-Pratt drain was placed in the residual pocket and the skin was closed with interrupted 3-0  nylon suture.  The patient was subsequently extubated and returned to his room in satisfactory condition.  III:  COMPLICATIONS:  There were no immediate complications.  IV:  RESULTS:  This demonstrates successful explantation of the old [employer] generator as well as a successful explantation of the entire ventricular pacing lead and removal of approximately 90% of the old atrial lead leaving the distal portion of the lead lodged in the innominate vein subclavian vein fibrous tissue.  The procedure was made more difficulty by the patients very large first rib and clavicle and the very small potential space for the telescope and sheath to travel through the first rib clavicle junction.  The patient will be treated with intravenous heparin, restarted on his Coumadin and be monitored on telemetry.   If there is no evidence of any heart block and if the patients QRS remains narrow six years after his aortic valve surgery, then it will be deemed that he may well in fact not need pacemaker.  He will be accessed for chronotropic imcompetence in several days after he is more completely healed from his surgery. DD:  09/07/00 TD:  09/07/00 Job: [id number]

## 2010-09-27 NOTE — Assessment & Plan Note (Signed)
[facility]                            CARDIOLOGY OFFICE NOTE   NAME:[name], Dale WILLIAMSEN                        MRN:          [id number]  DATE:04/06/2006                            DOB:          01/18/1944    Dale [name] returns today in followup.  He is status post aortic valve  replacement about 10 years ago.  He has had his Coumadin monitored at  our clinic and is doing well.  He is to see Shelby Dubin today.  He had  some questions about some home monitoring, which I referred to her.   He has not had any bleeding diaphysis.   He, fortunately, has lost about 12 pounds and looks better.   [name] has an LDL cholesterol of 88 on medications.  He is due for a  stress test.  He has not had 1 in about 3 years.  I told him that, even  though he did not have known coronary artery disease I would like to  follow him every 3 years or so with a stress test.  The patient had a  pacemaker placed around the time of his aortic valve replacement.  It  had to be removed for an infection.  He has not required reimplantation  and has no evidence of chronotropic incompetence or high grade heart  block.   REVIEW OF SYSTEMS:  Remarkable for no syncope, no palpitations, no  bleeding diaphysis, and no TIAs.   MEDICATIONS:  Listed in the chart.   EXAM:  HEENT:  Normal.  Blood pressure is 130/80, pulse is 70 and regular.  There is no carotid bruit.  There is no lymphadenopathy.  LUNGS:  Clear.  There is an S1, S2 click with a systolic ejection murmur.  There is no  AI.  ABDOMEN:  Benign.  LOWER EXTREMITIES:  Intact pulses.  No edema.  NEURO:  Nonfocal.   EKG today shows sinus rhythm with first degree block.  He is otherwise  normal.   IMPRESSION:  Stable status post aortic valve replacement, functioning  normally by recent echo.  No previous coronary artery disease.  Followup  stress Myoview.  The patient has a history of pacemaker implantation,  which was removed.  There  are no indications for reimplantation.  He  will continue to followup with [facility] in the lipid and Coumadin clinic.   I will see him back up in [name] in 6 months.     Noralyn Pick. Eden Emms, MD, [facility]  Electronically Signed    PCN/MedQ  DD: 04/06/2006  DT: 04/06/2006  Job #: [id number]

## 2010-09-27 NOTE — Op Note (Signed)
Dale [name], Dale [name]                 ACCOUNT NO.:  [id number]   MEDICAL RECORD NO.:  [id number]          PATIENT TYPE:  OIB   LOCATION:  5006                         FACILITY:  [facility]   PHYSICIAN:  Loreta Ave, M.D. DATE OF BIRTH:  01/18/1944   DATE OF PROCEDURE:  11/14/2004  DATE OF DISCHARGE:  11/15/2004                                 OPERATIVE REPORT   PREOPERATIVE DIAGNOSIS:  Left shoulder subacromial impingement, distal  clavicle osteolysis, and rotator cuff tear.   POSTOPERATIVE DIAGNOSIS:  Left shoulder subacromial impingement, marked  labral tearing.  Chronic long head biceps tendon tear, partial noncomplete  rotator cuff tear, extensive distal clavicle osteolysis.   PROCEDURE:  1.  Left shoulder exam under anesthesia.  2.  Arthroscopy.  3.  Debridement of labrum and rotator cuff.  4.  Acromioplasty, bursectomy, CA ligament release.  5.  Excision of distal clavicle.   SURGEON:  Loreta Ave, M.D.   ASSISTANT:  Garnette Scheuermann, PA.   ANESTHESIA:  General.   BLOOD LOSS:  Minimal.   SPECIMENS:  None.   CULTURES:  None.   COMPLICATIONS:  None.   DRESSINGS:  Soft, compressive sling.   PROCEDURE:  The patient brought to the operating room and placed on the  operating table in supine position.  After adequate anesthesia obtained,  left shoulder examined.  Full motion.  Good stability.  Placed in beach-  chair position.  The shoulder positioned and prepped and draped in the usual  sterile fashion.  Three standard portals anterior, posterior, and lateral.  Shoulder entered with blunt obturator and then the arthroscope.  Distended  and inspected.  Extensive tearing.  Anterior and superior labrum well  debrided.  Completely absent long head biceps tendon which appeared to be  chronic as there was no stump of the tendon at the top of the glenoid and  further I drove down the biceps tendon sheath which was completely empty.  Extensive partial tearing undersurface of  the supraspinatus tendon crescent  region.  Debrided to stable surface.  No full-thickness tears.  Articular  cartilage intact.  Labrum was debrided with a shaver.  Other structures in  the shoulder are normal.  Cannula redirected subacromial.  Traumatic  impingement type 2 acromion abrasive tearing on the top of the cuff, but no  full-thickness tears.  Bursa resected.  Cuff debrided.  Acromioplasty to the  top of the acromion releasing CA ligament.  Cuff a little thin over the  injury area, but not enough to warrant open intervention.  Distal clavicle  grade 4 changes, osteolysis, and some spurring.  I think the spurs in the  lateral centimeter of clavicle resected.  Adequacy of decompression,  debridement of the cuff, and acromioplasty assessed from all portals.  As  his biceps tendon rupture is chronic, open intervention for that was not  indicated now.  Instruments and fluid removed.  Portals and shoulder  injected Marcaine.  Portals closed with 4-0 nylon.  Sterile compressive  dressing applied.  Sling applied.  Anesthesia reversed.  Brought to recovery  room.  Tolerated surgery  well, no complications.       DFM/MEDQ  D:  11/15/2004  T:  11/15/2004  Job:  [id number]

## 2010-09-27 NOTE — Op Note (Signed)
[place]  Patient:    Dale [name], Dale [name]                        MRN: [id number] Proc. Date: 09/03/00 Adm. Date:  [id number] Attending:  Lewayne Bunting CC:         CVTS Office   Operative Report  OPERATION:  Removal of permanent transvenous pacemaker system.  PREOPERATIVE DIAGNOSIS:  Infected transvenous permanent pacemaker system.  POSTOPERATIVE DIAGNOSIS:  Infected transvenous permanent pacemaker system.  SURGEON:  Doylene Canning. Ladona Ridgel, M.D.  ASSISTANT:  Mikey Bussing, M.D.  ANESTHESIA:  General.  INDICATIONS:  The patient is a 64 year old male, who had undergone aortic valve replacement with a St. Jude valve for aortic stenosis in 1996.  At that time, a transvenous pacemaker system was placed for intermittent episodes of postoperative heart-block.  He subsequently developed normal sinus rhythm and was not pacemaker dependent.  He presented to the [facility] pacemaker clinic with tenderness and swelling over the pacemaker pocket and it was suspected that he had a low-grade infection.  Dr. Ladona Ridgel admitted the patient for extraction of the system and I was asked to provide operating room and surgical backup.  PROCEDURE:  Dr. Ladona Ridgel will dictate the majority of the procedure.  My role was providing surgical backup for unexpected bleeding and I also closed the pacemaker pocket at the end of the procedure.  The pocket was irrigated with copious amounts of antibiotic irrigation.  The fibrous capsule was excised.  A Jackson-Pratt drain was placed in the pacemaker pocket and brought up through a separate incision.  The pacemaker pocket was then closed in layers using interrupted 2-0 Vicryl for the pectoralis fascia, interrupted 3-0 Vicryl for the subcutaneous layer and interrupted 3-0 nylon for the skin.  A sterile dressing was applied and he was returned to the recovery room in stable condition. DD:  09/03/00 TD:  09/04/00 Job: [id number]

## 2010-09-27 NOTE — Discharge Summary (Signed)
[place]  Patient:    Dale [name], Dale [name]                        MRN: [id number] Adm. Date:  [id number] Disc. Date: [id number] Attending:  Lewayne Bunting Dictator:   Chinita Pester, C.R.N.P. CC:         Mikey Bussing, M.D.  Nathen May, M.D. [facility]   Discharge Summary  PRIMARY DIAGNOSIS:  Infected pacemaker.  SECONDARY DIAGNOSES: 1. Status post complete heart block. 2. Status post aortic valve replacement.  HISTORY OF PRESENT ILLNESS:  This is a 65 year old gentleman with a history of bicuspid aortic valve, status post aortic valve replacement in 1996.  His procedure was complicated by complete heart block requiring a permanent pacemaker.  This was done by Dr. Jimmy Footman.  He did fairly well and eventually returned to work.  Over the last several years he has done very well by his report his heart condition has improved, so he is no longer pacemaker dependent.  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  Over this time, he noticed increasing pain and swelling of his pacemaker generator, although he denies any actual trauma to the region.  PAST MEDICAL HISTORY:  Positive for hypercholesterolemia.  HOSPITAL COURSE:  The patient was admitted and went the operating room for a extraction of permanent pacemaker system, generator and lead extraction.  The patient had a large amount of serous fluid, cloudy and milky.  Cultures were sent.  The patient tolerated the procedure well.  He was placed on IV antibiotics.  A drain was placed into the site which was removed on April 26. He continued on heparin and Coumadin therapy for his aortic valve.  The patient was eventually discharged to home with an INR of 1.1.  He was discharged on Lovenox therapy.  DISCHARGE MEDICATIONS: 1. He was discharged on clindamycin 300  q.8h. for 10 days. 2. Coumadin 10 mg daily except 5 mg on Monday, Wednesday, Friday. 3. Percocet 1-2 tabs q.4-8h. as needed. 4. Vioxx 25 mg daily. 5. Lopid 600 twice day. 6. Pravachol 40 daily. 7. Neurontin as needed. 8. Accuretic 10/6.25 daily. 9. Lovenox as before q.12h., his next dose would be at 7 p.m.  DIET:  Low fat, low cholesterol, low salt diet.  WOUND CARE:  He was to keep his incision clean and dry.  He was to shower as per Dr. Donata Clay.  ACTIVITY:  No heavy lifting for six weeks.  SPECIAL INSTRUCTIONS:  He was scheduled to have an PT INR checked on May 1 at 8:45 a.m. at [facility].  He was to have an exercise test done with Dian Queen on May 20 at 11 a.m.  He was instructed to bring all of his medications, to wear comfortable clothing.  He was to be there at 10:45 a.m. He was to bring his insurance card and was instructed not to eat or drink anything after midnight on April 19.  FOLLOWUP:  The patient was to follow up with Dr. Donata Clay on May 7 at 2:15 p.m. and Dr. Ladona Ridgel in six weeks.  The office will call and schedule that appointment. DD:  09/07/00 TD:  09/08/00 Job: [id number]

## 2010-10-29 ENCOUNTER — Other Ambulatory Visit: Payer: Self-pay | Admitting: *Deleted

## 2010-10-29 MED ORDER — ATORVASTATIN CALCIUM 80 MG PO TABS: 80.0000 mg | ORAL_TABLET | Freq: Every day | ORAL | Status: DC

## 2011-01-08 ENCOUNTER — Encounter: Payer: Self-pay | Admitting: Cardiovascular Disease

## 2011-01-08 ENCOUNTER — Other Ambulatory Visit: Payer: Self-pay | Admitting: *Deleted

## 2011-01-08 MED ORDER — FENOFIBRATE 145 MG PO TABS: 145.0000 mg | ORAL_TABLET | Freq: Every day | ORAL | Status: DC

## 2011-01-08 NOTE — Telephone Encounter (Signed)
rx sent in today for tricor. Danielle Rankin

## 2011-01-09 ENCOUNTER — Ambulatory Visit (INDEPENDENT_AMBULATORY_CARE_PROVIDER_SITE_OTHER): Payer: 59 | Admitting: Cardiovascular Disease

## 2011-01-09 ENCOUNTER — Encounter: Payer: Self-pay | Admitting: Cardiovascular Disease

## 2011-01-09 DIAGNOSIS — I440 Atrioventricular block, first degree: Secondary | ICD-10-CM

## 2011-01-09 DIAGNOSIS — E781 Pure hyperglyceridemia: Secondary | ICD-10-CM

## 2011-01-09 DIAGNOSIS — I359 Nonrheumatic aortic valve disorder, unspecified: Secondary | ICD-10-CM

## 2011-01-09 DIAGNOSIS — I10 Essential (primary) hypertension: Secondary | ICD-10-CM

## 2011-01-09 DIAGNOSIS — Z954 Presence of other heart-valve replacement: Secondary | ICD-10-CM

## 2011-01-09 DIAGNOSIS — E669 Obesity, unspecified: Secondary | ICD-10-CM

## 2011-01-09 DIAGNOSIS — M1710 Unilateral primary osteoarthritis, unspecified knee: Secondary | ICD-10-CM

## 2011-01-09 MED ORDER — NIACIN ER (ANTIHYPERLIPIDEMIC) 500 MG PO TBCR: 500.0000 mg | EXTENDED_RELEASE_TABLET | Freq: Every day | ORAL | Status: DC

## 2011-01-09 MED ORDER — ATORVASTATIN CALCIUM 80 MG PO TABS: 80.0000 mg | ORAL_TABLET | Freq: Every day | ORAL | Status: DC

## 2011-01-09 MED ORDER — FENOFIBRATE 145 MG PO TABS: 145.0000 mg | ORAL_TABLET | Freq: Every day | ORAL | Status: DC

## 2011-01-09 MED ORDER — QUINAPRIL-HYDROCHLOROTHIAZIDE 20-12.5 MG PO TABS: 1.0000 | ORAL_TABLET | Freq: Two times a day (BID) | ORAL | Status: DC

## 2011-01-09 MED ORDER — WARFARIN SODIUM 10 MG PO TABS: 10.0000 mg | ORAL_TABLET | Freq: Every day | ORAL | Status: DC

## 2011-01-09 NOTE — Assessment & Plan Note (Signed)
Nomal exam.  Continue anticoagulation with coumadin.  SBE prophylaxis

## 2011-01-09 NOTE — Progress Notes (Signed)
Dale [name] is seen today for F.U of AVR in 1996 he had a cath in 2009 with normal cors. His weight is up again 12 lbs and he contnues to have poor diet with lots of bread and carbs. He denies SSCP, dyspnea, edema or syncope. His INR has been Rx. We talked about Pradaxa but I did not encourage it due to inability to reverse it. He stopped taking his Toprol two months ago. He thinks his palpitations and skipped beats were from Darvocet which was taken off the market.  He had medial meniscus surgery to his right knee with less than perfect results.  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  All other systems reviewed and negative  General: Affect appropriate  Weight 1/12 205 today 216 lbs Healthy:  appears stated age HEENT: normal Neck supple with no adenopathy JVP normal no bruits no thyromegaly Lungs clear with no wheezing and good diaphragmatic motion Heart:  S1/S2 click SEM no AR   Murmur, no rub, gallop or click PMI normal Abdomen: benighn, BS positve, no tenderness, no AAA no bruit.  No HSM or HJR Distal pulses intact with no bruits No edema Neuro non-focal Skin warm and dry No muscular weakness   Current Outpatient Prescriptions  Medication Sig Dispense Refill  . aspirin 81 MG tablet Take 81 mg by mouth daily.        Marland Kitchen atorvastatin (LIPITOR) 80 MG tablet Take 1 tablet (80 mg total) by mouth daily.  30 tablet  6  . celecoxib (CELEBREX) 200 MG capsule Take 200 mg by mouth daily.        . fenofibrate (TRICOR) 145 MG tablet Take 1 tablet (145 mg total) by mouth daily.  30 tablet  4  . HYDROcodone-acetaminophen (NORCO) 5-325 MG per tablet Take 1 tablet by mouth every 6 (six) hours  as needed.        . multivitamin (THERAGRAN) per tablet Take 1 tablet by mouth daily.        . naproxen (NAPROSYN) 500 MG tablet Take 500 mg by mouth 2 (two) times daily with a meal.        . niacin (NIASPAN) 500 MG CR tablet Take 1 tablet (500 mg total) by mouth at bedtime.  30 tablet  3  . pramipexole (MIRAPEX) 0.25 MG tablet Take 0.25 mg by mouth daily.        . quinapril-hydrochlorothiazide (ACCURETIC) 20-12.5 MG per tablet Take 1 tablet by mouth 2 (two) times daily.        Marland Kitchen warfarin (COUMADIN) 10 MG tablet Take 10 mg by mouth daily.          Allergies  Review of patient's allergies indicates no known allergies.  Electrocardiogram:  NSR 74  PR 270  Otherwise normal  Assessment and Plan

## 2011-01-09 NOTE — Patient Instructions (Signed)
Your physician wants you to follow-up in: 6 MONTHS You will receive a reminder letter in the mail two months in advance. If you don't receive a letter, please call our office to schedule the follow-up appointment. 

## 2011-01-09 NOTE — Assessment & Plan Note (Signed)
Well controlled.  Continue current medications and low sodium Dash type diet.    

## 2011-01-09 NOTE — Assessment & Plan Note (Signed)
S/P medial meniscus surgery with residual lateral tendon issues.  Continue antiinflamatory and electrostimulation.  F/U Dr Enis Gash

## 2011-01-09 NOTE — Assessment & Plan Note (Signed)
No high grade block. Likely related to AVR.  Avoid excess AV nodal blocking drugs.  Yearly ECG

## 2011-01-09 NOTE — Assessment & Plan Note (Signed)
Recent limitation to activity from knee.  Discussed rehab and low carb diet.  Target weight for 6 months is 200 lbs.  Variable motivation in past

## 2011-02-06 LAB — POCT I-STAT 3, VENOUS BLOOD GAS (G3P V)
O2 Saturation: 65
Operator id: 221371
TCO2: 26
pCO2, Ven: 47.4
pH, Ven: 7.321 — ABNORMAL HIGH

## 2011-02-06 LAB — PROTIME-INR
INR: 0.9
Prothrombin Time: 12.6

## 2011-09-11 ENCOUNTER — Ambulatory Visit: Payer: 59 | Admitting: Cardiovascular Disease

## 2011-10-08 ENCOUNTER — Ambulatory Visit: Payer: 59 | Admitting: Cardiovascular Disease

## 2011-11-10 ENCOUNTER — Ambulatory Visit: Payer: 59 | Admitting: Cardiovascular Disease

## 2011-12-11 ENCOUNTER — Encounter: Payer: Self-pay | Admitting: Cardiovascular Disease

## 2011-12-11 ENCOUNTER — Ambulatory Visit (INDEPENDENT_AMBULATORY_CARE_PROVIDER_SITE_OTHER): Payer: 59 | Admitting: Cardiovascular Disease

## 2011-12-11 VITALS — BP 116/77 | HR 83 | Wt 211.0 lb

## 2011-12-11 DIAGNOSIS — I10 Essential (primary) hypertension: Secondary | ICD-10-CM

## 2011-12-11 DIAGNOSIS — E781 Pure hyperglyceridemia: Secondary | ICD-10-CM

## 2011-12-11 MED ORDER — WARFARIN SODIUM 10 MG PO TABS: 10.0000 mg | ORAL_TABLET | Freq: Every day | ORAL | Status: DC

## 2011-12-11 MED ORDER — NIACIN ER (ANTIHYPERLIPIDEMIC) 500 MG PO TBCR: 500.0000 mg | EXTENDED_RELEASE_TABLET | Freq: Every day | ORAL | Status: DC

## 2011-12-11 MED ORDER — QUINAPRIL-HYDROCHLOROTHIAZIDE 20-12.5 MG PO TABS: 1.0000 | ORAL_TABLET | Freq: Two times a day (BID) | ORAL | Status: DC

## 2011-12-11 MED ORDER — FENOFIBRATE 145 MG PO TABS: 145.0000 mg | ORAL_TABLET | Freq: Every day | ORAL | Status: DC

## 2011-12-11 NOTE — Assessment & Plan Note (Signed)
Stable no high grade AV block yearly ECG   

## 2011-12-11 NOTE — Progress Notes (Signed)
Patient ID: Dale [name], male   DOB: 11-Apr-1946, 66 y.o.   MRN: [id number] Dale [name] is seen today for F.U of AVR in 1996 he had a cath in 2009 with normal cors. His weight is up again 12 lbs and he contnues to have poor diet with lots of bread and carbs. He denies SSCP, dyspnea, edema or syncope. His INR has been Rx. We talked about Pradaxa but I did not encourage it due to inability to reverse it. He stopped taking his Toprol two months ago. 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  Stopped and restless legs better    ROS: Denies fever, malais, weight loss, blurry vision, decreased visual acuity, cough, sputum, SOB, hemoptysis, pleuritic pain, palpitaitons, heartburn, abdominal pain, melena, lower extremity edema, claudication, or rash.  All other systems reviewed and negative  General: Affect appropriate Healthy:  appears stated age HEENT: normal Neck supple with no adenopathy JVP normal no bruits no thyromegaly Lungs clear with no wheezing and good diaphragmatic motion Heart:  S1/S2click SEM , no rub, gallop or click PMI normal Abdomen: benighn, BS positve, no tenderness, no AAA no bruit.  No HSM or HJR Distal pulses intact with no bruits No edema Neuro non-focal Skin warm and dry No muscular weakness   Current Outpatient Prescriptions  Medication Sig Dispense Refill  . aspirin 81 MG tablet Take 81 mg by mouth daily.        Marland Kitchen atorvastatin (LIPITOR) 80 MG tablet Take 1 tablet (80 mg total) by mouth daily.  90 tablet  4  . celecoxib (CELEBREX) 200 MG capsule Take 200 mg by mouth daily.        . fenofibrate (TRICOR) 145 MG tablet Take 1 tablet (145 mg total) by mouth daily.  90 tablet  4  . HYDROcodone-acetaminophen (NORCO) 5-325 MG per tablet Take 1 tablet by mouth every 6 (six) hours as needed.        . multivitamin  (THERAGRAN) per tablet Take 1 tablet by mouth daily.        . niacin (NIASPAN) 500 MG CR tablet Take 1 tablet (500 mg total) by mouth at bedtime.  90 tablet  4  . quinapril-hydrochlorothiazide (ACCURETIC) 20-12.5 MG per tablet Take 1 tablet by mouth 2 (two) times daily.  180 tablet  4  . warfarin (COUMADIN) 10 MG tablet Take 1 tablet (10 mg total) by mouth daily.  90 tablet  4    Allergies  Review of patient's allergies indicates no known allergies.  Electrocardiogram:  01/09/11  SR LBBB chronic  Assessment and Plan

## 2011-12-11 NOTE — Assessment & Plan Note (Signed)
Well controlled.  Continue current medications and low sodium Dash type diet.    

## 2011-12-11 NOTE — Patient Instructions (Signed)
Your physician wants you to follow-up in:  6 MONTHS WITH DR NISHAN  You will receive a reminder letter in the mail two months in advance. If you don't receive a letter, please call our office to schedule the follow-up appointment. Your physician recommends that you continue on your current medications as directed. Please refer to the Current Medication list given to you today. 

## 2011-12-11 NOTE — Assessment & Plan Note (Signed)
Normal exam SBE prophylaxis normal function  Continue coumadin

## 2011-12-15 ENCOUNTER — Other Ambulatory Visit: Payer: Self-pay | Admitting: Cardiovascular Disease

## 2011-12-18 ENCOUNTER — Other Ambulatory Visit: Payer: Self-pay | Admitting: Cardiovascular Disease

## 2011-12-29 ENCOUNTER — Telehealth: Payer: Self-pay | Admitting: Cardiovascular Disease

## 2011-12-29 MED ORDER — CELECOXIB 200 MG PO CAPS: 200.0000 mg | ORAL_CAPSULE | Freq: Every day | ORAL | Status: DC

## 2011-12-29 NOTE — Telephone Encounter (Signed)
PER PT NEED REFILLS FOR CELEBREX  200 MG  SENT VIA EPIC  FOR  NUMBER 90 AND   3 REFILLS .Zack Seal

## 2011-12-29 NOTE — Telephone Encounter (Signed)
New msg Pt's wife wants to talk to you about prescriptions

## 2011-12-30 ENCOUNTER — Encounter: Payer: Self-pay | Admitting: Gastroenterology

## 2012-01-26 ENCOUNTER — Encounter: Payer: Self-pay | Admitting: Gastroenterology

## 2012-02-23 ENCOUNTER — Encounter: Payer: Self-pay | Admitting: Gastroenterology

## 2012-02-26 ENCOUNTER — Encounter: Payer: Self-pay | Admitting: *Deleted

## 2012-03-01 ENCOUNTER — Encounter: Payer: Self-pay | Admitting: Gastroenterology

## 2012-03-01 ENCOUNTER — Telehealth: Payer: Self-pay | Admitting: *Deleted

## 2012-03-01 ENCOUNTER — Ambulatory Visit (INDEPENDENT_AMBULATORY_CARE_PROVIDER_SITE_OTHER): Payer: 59 | Admitting: Physician Assistant

## 2012-03-01 ENCOUNTER — Encounter: Payer: Self-pay | Admitting: Physician Assistant

## 2012-03-01 VITALS — BP 120/78 | HR 68 | Ht 66.0 in | Wt 213.4 lb

## 2012-03-01 DIAGNOSIS — Z1211 Encounter for screening for malignant neoplasm of colon: Secondary | ICD-10-CM

## 2012-03-01 MED ORDER — MOVIPREP 100 G PO SOLR: 1.0000 | Freq: Once | ORAL | Status: DC

## 2012-03-01 NOTE — Progress Notes (Signed)
Subjective:    Patient ID: Dale [name], male    DOB: 12-06-1945, 66 y.o.   MRN: [id number]  HPI Dale [name] is a pleasant 66 year old white male known to Dr. Russella Dar from prior colonoscopy done in 2003. This was a normal exam with the exception of internal hemorrhoids. Patient is status post aortic valve replacement with a St. Jude valve in 1996 and is maintained on chronic Coumadin. He also has history of and osteoarthritis. He comes in today to discuss followup colonoscopy and anticoagulation. He has no current GI complaints has been feeling well and has had no recent cardiac issues.    Review of Systems  Constitutional: Negative.   HENT: Negative.   Eyes: Negative.   Respiratory: Negative.   Cardiovascular: Negative.   Gastrointestinal: Negative.   Genitourinary: Negative.   Musculoskeletal: Negative.   Neurological: Negative.   Hematological: Negative.   Psychiatric/Behavioral: Negative.    Outpatient Prescriptions Prior to Visit  Medication Sig Dispense Refill  . aspirin 81 MG tablet Take 81 mg by mouth daily.        . celecoxib (CELEBREX) 200 MG capsule Take 1 capsule (200 mg total) by mouth daily.  90 capsule  3  . fenofibrate (TRICOR) 145 MG tablet Take 1 tablet (145 mg total) by mouth daily.  90 tablet  4  . HYDROcodone-acetaminophen (NORCO) 5-325 MG per tablet Take 1 tablet by mouth every 6 (six) hours as needed.        . multivitamin (THERAGRAN) per tablet Take 1 tablet by mouth daily.        . niacin (NIASPAN) 500 MG CR tablet Take 1 tablet (500 mg total) by mouth at bedtime.  90 tablet  4  . quinapril-hydrochlorothiazide (ACCURETIC) 20-12.5 MG per tablet Take 1 tablet by mouth 2 (two) times daily.  180 tablet  4  . warfarin (COUMADIN) 10 MG tablet Take 1 tablet (10 mg total) by mouth daily.  90 tablet  4  . atorvastatin (LIPITOR) 80 MG tablet Take 1 tablet (80 mg total) by mouth daily.  90 tablet  4       Allergies  Allergen Reactions  . Penicillins    Patient Active  Problem List  Diagnosis  . ESSENTIAL HYPERTENSION, BENIGN  . LEFT BUNDLE BRANCH BLOCK  . ARTHRITIS, KNEE  . DYSPNEA ON EXERTION  . AORTIC VALVE REPLACEMENT, HX OF  . Obese  . First degree atrioventricular block   History  Substance Use Topics  . Smoking status: Former Games developer  . Smokeless tobacco: Never Used  . Alcohol Use: 0.0 oz/week     2 oz nightly    Objective:   Physical Exam well-developed white male in no acute distress blood pressure 120/78 pulse 68 height 5 foot 6 weight 213. HEENT; nontraumatic normocephalic EOMI PERRLA sclera anicteric, Neck; supple no JVD, Cardiovascular; regular rate and rhythm with S1-S2 soft valve click, Pulmonary; clear bilaterally, Abdomen; soft nontender nondistended bowel sounds are active there is no palpable mass or hepatosplenomegaly, Rectal; exam not done, Extremities; no clubbing cyanosis or edema skin warm and dry, Psych; mood and affect normal and appropriate        Assessment & Plan:  #97 66 year old male due for colon neoplasia screening, last colonoscopy 10 years ago-normal. #2 chronic  anticoagulation with Coumadin #3 status post St. Jude aortic valve replacement #4 hypertension #5 left bundle-branch block  Plan; schedule for colonoscopy with Dr. Russella Dar on December 3-procedure discussed in detail with the patient and his wife and they  are agreeable to proceed Will obtain consent from Dr. Eden Emms to come off of Coumadin for 5 days prior to procedure.

## 2012-03-01 NOTE — Progress Notes (Signed)
Reviewed and agree with management plans.  Iam Lipson T. Lanai Conlee MD FACG  

## 2012-03-01 NOTE — Telephone Encounter (Signed)
03/01/2012    RE: Dale [name] DOB: [birth year] MRN: [id number]   Dear Dr. Eden Emms,    We have scheduled the above patient for an endoscopic procedure. Our records show that he is on anticoagulation therapy.   Please advise as to how long the patient may come off his therapy of Coumadin prior to the procedure, which is scheduled for 04-13-2012.  Does this patient need SBE Prophylaxis?   Please fax back/ or route the completed form to [facility] at [phone].   Sincerely,  Ok Anis

## 2012-03-01 NOTE — Patient Instructions (Addendum)
You have been scheduled for a colonoscopy with propofol. Please follow written instructions given to you at your visit today.  Please pick up your prep kit at the pharmacy within the next 1-3 days. If you use inhalers (even only as needed), please bring them with you on the day of your procedure.  We have sent the following medication to your pharmacy for you to pick up at your convenience: Moviprep      

## 2012-03-01 NOTE — Telephone Encounter (Signed)
Can come off coumadin for 5 days before procedure and no lovenox bridge

## 2012-03-02 NOTE — Telephone Encounter (Signed)
Called patient advised him per Dr. Eden Emms that he can come off of his coumadin 5 days prior to procedure.  Patient verbalized understanding

## 2012-04-13 ENCOUNTER — Encounter: Payer: Self-pay | Admitting: Gastroenterology

## 2012-04-13 ENCOUNTER — Ambulatory Visit (AMBULATORY_SURGERY_CENTER): Payer: 59 | Admitting: Gastroenterology

## 2012-04-13 VITALS — BP 120/77 | HR 62 | Temp 98.8°F | Resp 14 | Ht 66.0 in | Wt 213.0 lb

## 2012-04-13 DIAGNOSIS — D126 Benign neoplasm of colon, unspecified: Secondary | ICD-10-CM

## 2012-04-13 DIAGNOSIS — Z1211 Encounter for screening for malignant neoplasm of colon: Secondary | ICD-10-CM

## 2012-04-13 MED ORDER — SODIUM CHLORIDE 0.9 % IV SOLN: 500.0000 mL | INTRAVENOUS | Status: DC

## 2012-04-13 NOTE — Progress Notes (Signed)
Patient did not experience any of the following events: a burn prior to discharge; a fall within the facility; wrong site/side/patient/procedure/implant event; or a hospital transfer or hospital admission upon discharge from the facility. (G8907) Patient did not have preoperative order for IV antibiotic SSI prophylaxis. (G8918)  

## 2012-04-13 NOTE — Op Note (Signed)
[facility] [address] [place], [address]   COLONOSCOPY PROCEDURE REPORT  PATIENT: Dale [name], Dale [name]  MR#: [id number] BIRTHDATE: 1945/10/31 , 66  yrs. old GENDER: Male ENDOSCOPIST: Meryl Dare, MD, [facility] PROCEDURE DATE:  04/13/2012 PROCEDURE:   Colonoscopy with snare polypectomy ASA CLASS:   Class III INDICATIONS:average risk screening. MEDICATIONS: MAC sedation, administered by CRNA and propofol (Diprivan) 300mg  IV DESCRIPTION OF PROCEDURE:   After the risks benefits and alternatives of the procedure were thoroughly explained, informed consent was obtained.  A digital rectal exam revealed no abnormalities of the rectum.   The LB [id number]  endoscope was introduced through the anus and advanced to the cecum, which was identified by both the appendix and ileocecal valve. No adverse events experienced.   The quality of the prep was excellent, using MoviPrep  The instrument was then slowly withdrawn as the colon was fully examined.  COLON FINDINGS: A sessile polyp measuring 6 mm in size was found in the descending colon.  A polypectomy was performed with a cold snare.  The resection was complete and the polyp tissue was completely retrieved.   The colon was otherwise normal.  There was no diverticulosis, inflammation, polyps or cancers unless previously stated.  Retroflexed views revealed small internal hemorrhoids. The time to cecum=1 minutes 27 seconds.  Withdrawal time=9 minutes 07 seconds.  The scope was withdrawn and the procedure completed. COMPLICATIONS: There were no complications.  ENDOSCOPIC IMPRESSION: 1.   Sessile polyp measuring 6 mm in the descending colon; polypectomy performed with a cold snare 2.   Small internal hemorrhoids  RECOMMENDATIONS: 1.  Await pathology results 2.  Repeat colonoscopy in 5 years if polyp adenomatous; otherwise 10 years 3.  Resume Coumadin (warfarin) today and have your PT/INR checked within 1  week.   eSigned:  Meryl Dare, MD, [facility] 04/13/2012 11:09 AM   cc: Oval Linsey, MD

## 2012-04-13 NOTE — Patient Instructions (Addendum)
YOU HAD AN ENDOSCOPIC PROCEDURE TODAY AT THE [facility]: Refer to the procedure report that was given to you for any specific questions about what was found during the examination.  If the procedure report does not answer your questions, please call your gastroenterologist to clarify.  If you requested that your care partner not be given the details of your procedure findings, then the procedure report has been included in a sealed envelope for you to review at your convenience later.  YOU SHOULD EXPECT: Some feelings of bloating in the abdomen. Passage of more gas than usual.  Walking can help get rid of the air that was put into your GI tract during the procedure and reduce the bloating. If you had a lower endoscopy (such as a colonoscopy or flexible sigmoidoscopy) you may notice spotting of blood in your stool or on the toilet paper. If you underwent a bowel prep for your procedure, then you may not have a normal bowel movement for a few days.  DIET: Your first meal following the procedure should be a light meal and then it is ok to progress to your normal diet.  A half-sandwich or bowl of soup is an example of a good first meal.  Heavy or fried foods are harder to digest and may make you feel nauseous or bloated.  Likewise meals heavy in dairy and vegetables can cause extra gas to form and this can also increase the bloating.  Drink plenty of fluids but you should avoid alcoholic beverages for 24 hours.  ACTIVITY: Your care partner should take you home directly after the procedure.  You should plan to take it easy, moving slowly for the rest of the day.  You can resume normal activity the day after the procedure however you should NOT DRIVE or use heavy machinery for 24 hours (because of the sedation medicines used during the test).    SYMPTOMS TO REPORT IMMEDIATELY: A gastroenterologist can be reached at any hour.  During normal business hours, 8:30 AM to 5:00 PM Monday through Friday,  call [phone].  After hours and on weekends, please call the GI answering service at [phone] who will take a message and have the physician on call contact you.   Following lower endoscopy (colonoscopy or flexible sigmoidoscopy):  Excessive amounts of blood in the stool  Significant tenderness or worsening of abdominal pains  Swelling of the abdomen that is new, acute  Fever of 100F or higher    FOLLOW UP: If any biopsies were taken you will be contacted by phone or by letter within the next 1-3 weeks.  Call your gastroenterologist if you have not heard about the biopsies in 3 weeks.  Our staff will call the home number listed on your records the next business day following your procedure to check on you and address any questions or concerns that you may have at that time regarding the information given to you following your procedure. This is a courtesy call and so if there is no answer at the home number and we have not heard from you through the emergency physician on call, we will assume that you have returned to your regular daily activities without incident.  SIGNATURES/CONFIDENTIALITY: You and/or your care partner have signed paperwork which will be entered into your electronic medical record.  These signatures attest to the fact that that the information above on your After Visit Summary has been reviewed and is understood.  Full responsibility of the confidentiality   of this discharge information lies with you and/or your care-partner.   Polyp and hemorrhoid information given.  Dr. Russella Dar will let you know about next colonoscopy based on pathology report.  Resume coumadin today, and have PT/INR checked within one wek.

## 2012-04-14 ENCOUNTER — Telehealth: Payer: Self-pay | Admitting: *Deleted

## 2012-04-14 NOTE — Telephone Encounter (Signed)
  Follow up Call-  Call back number 04/13/2012  Post procedure Call Back phone  # [phone]  Permission to leave phone message Yes     Left message on number given in admitting yesterday as per pt.ewm

## 2012-04-19 ENCOUNTER — Encounter: Payer: Self-pay | Admitting: Gastroenterology

## 2013-02-07 ENCOUNTER — Other Ambulatory Visit: Payer: Self-pay | Admitting: Cardiovascular Disease

## 2013-02-18 ENCOUNTER — Ambulatory Visit (HOSPITAL_COMMUNITY)
Admission: RE | Admit: 2013-02-18 | Discharge: 2013-02-18 | Disposition: A | Discharge status: Home / Self Care | Payer: Medicare Other | Source: Ambulatory Visit | Attending: Family Medicine | Admitting: Family Medicine

## 2013-02-18 ENCOUNTER — Encounter: Payer: Self-pay | Admitting: Cardiovascular Disease

## 2013-02-18 ENCOUNTER — Other Ambulatory Visit (HOSPITAL_COMMUNITY): Payer: Self-pay | Admitting: Family Medicine

## 2013-02-18 DIAGNOSIS — J45909 Unspecified asthma, uncomplicated: Secondary | ICD-10-CM

## 2013-02-18 DIAGNOSIS — R05 Cough: Secondary | ICD-10-CM | POA: Insufficient documentation

## 2013-02-18 DIAGNOSIS — R0602 Shortness of breath: Secondary | ICD-10-CM | POA: Insufficient documentation

## 2013-02-18 DIAGNOSIS — R059 Cough, unspecified: Secondary | ICD-10-CM | POA: Insufficient documentation

## 2013-02-18 DIAGNOSIS — R918 Other nonspecific abnormal finding of lung field: Secondary | ICD-10-CM | POA: Insufficient documentation

## 2013-02-24 ENCOUNTER — Ambulatory Visit: Payer: 59 | Admitting: Cardiovascular Disease

## 2013-02-28 ENCOUNTER — Encounter: Payer: Self-pay | Admitting: Cardiovascular Disease

## 2013-02-28 ENCOUNTER — Ambulatory Visit (INDEPENDENT_AMBULATORY_CARE_PROVIDER_SITE_OTHER): Payer: Medicare Other | Admitting: Cardiovascular Disease

## 2013-02-28 ENCOUNTER — Ambulatory Visit (INDEPENDENT_AMBULATORY_CARE_PROVIDER_SITE_OTHER)
Admission: RE | Admit: 2013-02-28 | Discharge: 2013-02-28 | Disposition: A | Discharge status: Home / Self Care | Payer: Medicare Other | Source: Ambulatory Visit | Attending: Cardiovascular Disease | Admitting: Cardiovascular Disease

## 2013-02-28 VITALS — BP 151/91 | HR 66 | Ht 67.0 in | Wt 213.8 lb

## 2013-02-28 DIAGNOSIS — J849 Interstitial pulmonary disease, unspecified: Secondary | ICD-10-CM

## 2013-02-28 DIAGNOSIS — J8410 Pulmonary fibrosis, unspecified: Secondary | ICD-10-CM

## 2013-02-28 DIAGNOSIS — R05 Cough: Secondary | ICD-10-CM

## 2013-02-28 DIAGNOSIS — Z954 Presence of other heart-valve replacement: Secondary | ICD-10-CM

## 2013-02-28 DIAGNOSIS — J189 Pneumonia, unspecified organism: Secondary | ICD-10-CM

## 2013-02-28 DIAGNOSIS — R0602 Shortness of breath: Secondary | ICD-10-CM

## 2013-02-28 DIAGNOSIS — R059 Cough, unspecified: Secondary | ICD-10-CM

## 2013-02-28 LAB — SEDIMENTATION RATE: Sed Rate: 4 mm/h (ref 0–22)

## 2013-02-28 LAB — BRAIN NATRIURETIC PEPTIDE: Pro B Natriuretic peptide (BNP): 168 pg/mL — ABNORMAL HIGH (ref 0.0–100.0)

## 2013-02-28 MED ORDER — QUINAPRIL-HYDROCHLOROTHIAZIDE 20-12.5 MG PO TABS: 1.0000 | ORAL_TABLET | Freq: Two times a day (BID) | ORAL | Status: DC

## 2013-02-28 MED ORDER — WARFARIN SODIUM 10 MG PO TABS: 10.0000 mg | ORAL_TABLET | Freq: Every day | ORAL | Status: DC

## 2013-02-28 MED ORDER — CELECOXIB 200 MG PO CAPS: 200.0000 mg | ORAL_CAPSULE | Freq: Every day | ORAL | Status: DC

## 2013-02-28 MED ORDER — FENOFIBRATE 145 MG PO TABS: 145.0000 mg | ORAL_TABLET | Freq: Every day | ORAL | Status: DC

## 2013-02-28 NOTE — Assessment & Plan Note (Signed)
Stable no high grade AV block 

## 2013-02-28 NOTE — Progress Notes (Signed)
Patient ID: Dale [name], male   DOB: 1946-04-16, 67 y.o.   MRN: [id number] Dale [name] is seen today for F.U of AVR in 1996 he had a cath in 2009 with normal cors. His weight is up again 12 lbs and he contnues to have poor diet with lots of bread and carbs. He denies SSCP, dyspnea, edema or syncope. His INR has been Rx. We talked about Pradaxa but I did not encourage it due to inability to reverse it. He stopped taking his Toprol two months ago. He thinks his palpitations and skipped beats were from Darvocet which was taken off the market. He had medial meniscus surgery to his right knee with less than perfect results. 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 Pneumonia Has not responded to antibiotics, prednisone and holding ACE.  No fever Non productive cough .   CXR 10/10 with pneumonitis  Central vascular congestion. Mild interstitial prominence bilaterally suspicious for mild edema or pneumonitis. There is superimposed hazy airspace is in right upper lobe. Infiltrate/pneumonia is suspected. Follow-up to resolution is recommended.   ROS: Denies fever, malais, weight loss, blurry vision, decreased visual acuity, cough, sputum, SOB, hemoptysis, pleuritic pain, palpitaitons, heartburn, abdominal pain, melena, lower extremity edema, claudication, or rash.  All other systems reviewed and negative  General: Affect appropriate Healthy:  appears stated age HEENT: normal Neck supple with no adenopathy JVP normal no bruits no thyromegaly Lungs poor air movement with crackles right greater than left  good diaphragmatic motion Heart:  S1/S2 normal AVR no AR  murmur, no rub, gallop or click PMI normal Abdomen: benighn, BS positve, no tenderness, no AAA no bruit.  No HSM or HJR Distal pulses intact with no bruits No edema Neuro non-focal Skin  warm and dry No muscular weakness   Current Outpatient Prescriptions  Medication Sig Dispense Refill  . aspirin 81 MG tablet Take 81 mg by mouth daily.        Marland Kitchen HYDROcodone-acetaminophen (NORCO) 5-325 MG per tablet Take 1 tablet by mouth every 6 (six) hours as needed.        . multivitamin (THERAGRAN) per tablet Take 1 tablet by mouth daily.        . TRICOR 145 MG tablet TAKE 1 TABLET ONCE DAILY  90 tablet  0  . warfarin (COUMADIN) 10 MG tablet Take 1 tablet (10 mg total) by mouth daily.  90 tablet  4  . atorvastatin (LIPITOR) 80 MG tablet Take 1 tablet (80 mg total) by mouth daily.  90 tablet  4  . celecoxib (CELEBREX) 200 MG capsule Take 1 capsule (200 mg total) by mouth daily.  90 capsule  3  . niacin (NIASPAN) 500 MG CR tablet Take 1 tablet (500 mg total) by mouth at bedtime.  90 tablet  4  . quinapril-hydrochlorothiazide (ACCURETIC) 20-12.5 MG per tablet Take 1 tablet by mouth 2 (two) times daily.  180 tablet  4   No current facility-administered medications for this visit.    Allergies  Penicillins  Electrocardiogram:  SR rate 64  PR 256 LAE    Assessment and Plan

## 2013-02-28 NOTE — Assessment & Plan Note (Signed)
Concerned at persistant symptoms  Check BNP and echo to r/o CHF.  High res CT to further elucidate abnormality in RUL.  ? ILD Finishing steroid taper in 2 days.  F/U pulmonary

## 2013-02-28 NOTE — Patient Instructions (Signed)
Your physician recommends that you schedule a follow-up appointment in: NEXT  AVAILABLE  Your physician recommends that you continue on your current medications as directed. Please refer to the Current Medication list given to you today.  Your physician has requested that you have an echocardiogram. Echocardiography is a painless test that uses sound waves to create images of your heart. It provides your doctor with information about the size and shape of your heart and how well your heart's chambers and valves are working. This procedure takes approximately one hour. There are no restrictions for this procedure.   CHEST  CT   TODAY  Your physician recommends that you return for lab work in: TODAY  BNP  SED RATE  You have been referred to PULMONARY

## 2013-02-28 NOTE — Assessment & Plan Note (Signed)
Normla on exam Given recent "infection" check echo for gradients and r/o vegetations

## 2013-03-01 ENCOUNTER — Telehealth: Payer: Self-pay | Admitting: *Deleted

## 2013-03-01 DIAGNOSIS — J849 Interstitial pulmonary disease, unspecified: Secondary | ICD-10-CM

## 2013-03-01 NOTE — Telephone Encounter (Signed)
Message copied by Tommie Sams on Tue Mar 01, 2013  4:39 PM ------      Message from: Oretha Milch      Created: Tue Mar 01, 2013  4:35 PM       Carmin Muskrat,      Can you see him ASAP - seems like ILD?      If not, pl get him seen by other provider            See below            Hey guys  This guy is very symptomatic sub acutely over the last 2 months.  He is not getting better with antibiotics and steroids.  We put a pulmonary consult in today.  Can someone please see him this week Let me know  ESR normal Exam markedly abnormal as is CT-Christine York ------

## 2013-03-01 NOTE — Telephone Encounter (Signed)
ATC pt and his VM is not set up yet [facility]

## 2013-03-02 NOTE — Telephone Encounter (Signed)
Follow up    Pt's wife returned called and asked if you would call her back on her cell with the results Please.    # [phone]    ASAP      Thanks!

## 2013-03-02 NOTE — Telephone Encounter (Signed)
Triage (with ccJ Yancey Flemings)  Please work with Carron Curie and get him added on for office Monday 03/07/13 or Tuesday 03/08/13,  3pm onwards . Those 2 days I am in [facility] but at 3pm I think I can come over esp Monday. I will need between now and possible to facilitate the work up  A) full set of PFT - any location, ordered  B) on day he comes, needs walk test on RA  Thanks  MR

## 2013-03-02 NOTE — Telephone Encounter (Signed)
I spoke with pt. He reports I needed to speak with spouse. I spoke with her. He is scheduled to have PFT done at [facility] at 1:30. Pt will then come over and see MR once done. He is scheduled for Monday 03/07/13 at 3:00 with MR. Will forward to MR as an [place]

## 2013-03-02 NOTE — Progress Notes (Signed)
Quick Note:  Please see phone msg from 03/01/13. Pt is scheduled to see MR on Monday, Oct 27 with PFTs. ______

## 2013-03-02 NOTE — Telephone Encounter (Signed)
PT'S WIFE AWARE OF  CHEST  CT AND  LABS .Zack Seal

## 2013-03-02 NOTE — Progress Notes (Signed)
Quick Note:  lmomtcb on pt's home # ATC pt's cell # NA and VM has not yet been set up. Can pt come in tomorrow, Oct 23 to see Dr. Sherene Sires as a new consult? ______

## 2013-03-02 NOTE — Telephone Encounter (Signed)
lmomtcb x1 for pt 

## 2013-03-03 ENCOUNTER — Other Ambulatory Visit: Payer: Medicare Other

## 2013-03-03 DIAGNOSIS — J849 Interstitial pulmonary disease, unspecified: Secondary | ICD-10-CM

## 2013-03-03 LAB — SEDIMENTATION RATE: Sed Rate: 1 mm/hr (ref 0–16)

## 2013-03-03 NOTE — Telephone Encounter (Signed)
I spoke with spouse. She is going to bring pt today for labs. She is also aware of MR's recs. Nothing further needed

## 2013-03-03 NOTE — Addendum Note (Signed)
Addended byKalman Shan on: 03/03/2013 06:53 AM   Modules accepted: Orders

## 2013-03-03 NOTE — Telephone Encounter (Signed)
Can you please let the patient or spouse  know in response to question that   A) Ct shows a type of disease called interstitial lung disease. There are many specific disease types in this and that is what we wil try to go over during visit.  B) in addition to PFT, would be helpful if they did ONO between now and visit; if they are ok with it order it.  C) Also, order Serum: ESR, ANA, DS-DNA, RF, anti-CCP, ssA, ssB, scl-70, ANCA, Total CK,  Aldolase, ACE, RNP, Smith antibody.    I have ordered both for your convenience  Please explain that these are part ofl ILD workup and these results will facilitate the visit and save time and make discussion more meaningful. However, if they want to wait till they see me is fine but the order is in for their convenience   Dr. Kalman Shan, M.D., [facility].C.P Pulmonary and Critical Care Medicine Staff Physician [facility] [place] [facility] Pager: [phone], If no answer or between  15:00h - 7:00h: call [phone]  03/03/2013 6:51 AM

## 2013-03-04 LAB — MPO/PR-3 (ANCA) ANTIBODIES: Serine Protease 3: 1 AU/mL (ref <20)

## 2013-03-04 LAB — ANCA SCREEN W REFLEX TITER
Atypical p-ANCA Screen: NEGATIVE
c-ANCA Screen: NEGATIVE
p-ANCA Screen: NEGATIVE

## 2013-03-04 LAB — CK: Total CK: 44 U/L (ref 7–232)

## 2013-03-04 LAB — ANGIOTENSIN CONVERTING ENZYME: Angiotensin-Converting Enzyme: 1 U/L — ABNORMAL LOW (ref 8–52)

## 2013-03-04 LAB — ANTI-SCLERODERMA ANTIBODY: Scleroderma (Scl-70) (ENA) Antibody, IgG: <1 AU/mL (ref <30)

## 2013-03-05 LAB — ALDOLASE: Aldolase: 7.3 U/L (ref <=8.1)

## 2013-03-07 ENCOUNTER — Telehealth: Payer: Self-pay | Admitting: Internal Medicine

## 2013-03-07 ENCOUNTER — Ambulatory Visit (INDEPENDENT_AMBULATORY_CARE_PROVIDER_SITE_OTHER): Payer: Medicare Other | Admitting: Internal Medicine

## 2013-03-07 ENCOUNTER — Institutional Professional Consult (permissible substitution): Payer: Medicare Other | Admitting: Pulmonary Disease

## 2013-03-07 ENCOUNTER — Encounter: Payer: Self-pay | Admitting: Internal Medicine

## 2013-03-07 ENCOUNTER — Ambulatory Visit (HOSPITAL_COMMUNITY)
Admission: RE | Admit: 2013-03-07 | Discharge: 2013-03-07 | Disposition: A | Discharge status: Home / Self Care | Payer: Medicare Other | Source: Ambulatory Visit | Attending: Internal Medicine | Admitting: Internal Medicine

## 2013-03-07 VITALS — BP 102/82 | HR 94 | Temp 97.4°F | Ht 67.0 in | Wt 212.0 lb

## 2013-03-07 DIAGNOSIS — J8410 Pulmonary fibrosis, unspecified: Secondary | ICD-10-CM

## 2013-03-07 DIAGNOSIS — E669 Obesity, unspecified: Secondary | ICD-10-CM | POA: Insufficient documentation

## 2013-03-07 DIAGNOSIS — J849 Interstitial pulmonary disease, unspecified: Secondary | ICD-10-CM

## 2013-03-07 LAB — PULMONARY FUNCTION TEST

## 2013-03-07 MED ORDER — ALBUTEROL SULFATE (5 MG/ML) 0.5% IN NEBU
2.5000 mg | INHALATION_SOLUTION | Freq: Once | RESPIRATORY_TRACT | Status: AC
  Administered 2013-03-07: 2.5 mg via RESPIRATORY_TRACT

## 2013-03-07 NOTE — Telephone Encounter (Signed)
Per MR they are really not going to help him. If he feels the albuterol helps then he can use this PRN not to exceed 1-2 times daily.   LMTCB x1 for [employer]

## 2013-03-07 NOTE — Assessment & Plan Note (Signed)
Discussed disease 1. 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 Explained this is really an umbrella term the does not mean anything specific in terms of etiology, prognosis, treatment options without establishing a specific diagnosis  2. Explained varied etiology and varied differential diagnosis such as autoimmune lung disease [ruled out by blood test] and other differential diagnoses that includes diseases that most patients have never heard of. These are NSIP, chronic eosinophilic pneumonitis, hypersensitivity pneumonitis, sarcoidosis, idiopathic pulmonary fibrosis (IPF) which has a genetic component, BOOP/COP, and that smokers specific diagnosis such as RB-ILD, DIP. These are only a few of the many. Of these, explained IPF is the most severe disease which is progressive and fatal with a median survival of 3-7 years.  3. Explained for him NSIP and IPF are the top leading diagnosis; with age, male sex, gerd favoring IPF  4. Explained  I will talk to radilogist (chest specific) and if not confidient is UIP on CT chest, then refer to VATS lung biopsy [Gen. process of video-assisted thoracoscopic lung biopsy with the yield  of greater than 90% and risk of general anesthesia, pain, hospitalization for a few days, bronchopleural fistula, local site infection, post biopsy neuralgia and rarely death and risk for pulmonary embolism explained]   5. Explained symptom control possible; for cough with antitussives. For dyspnea with rehabilitation and hypoxemia with oxygen; at this currrent time wait till diagnosis established before we start on this  6.Explained specific treatment depends on specific diagnosis. Extended IPF has a new treatment called Pirfenidone and another drug called OFEV as of 03/07/2013 there delay is a progression of fibrosis with potential mortality benefit  Based on all this they will have a surgical evaluation for lung biopsy with Dr Kathlee Nations Tright if  discussion with radiologist goes towards that

## 2013-03-07 NOTE — Progress Notes (Signed)
Subjective:    Patient ID: Dale [name], male    DOB: 1945/08/17, 67 y.o.   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 Reports that as of a year ago was able to go hunting and haul deeer and do extreme physical activity without any problems. Then approximately 3 months ago started developing insidious onset of cough that initially was only at nighttime and early morning but then progressed involving the whole day. Cough is associated with white sputum only. At no point he had colored sputum or fever. Of 3-4 weeks ago cough was severe. Is also associated with insidious onset of dyspnea 2 months ago. This also is been progressive. Dyspnea is mild to moderate in intensity and present with class II level of exertion and relieved with rest without any associated chest pain. Both symptoms are not associated with syncope, dizziness, orthostasis, edema, chest pain.  Approximately 3 weeks ago primary care physician diagnosed him to have pneumonia on a chest x-ray [to me this looks like diffuse interstitial lung disease]. Then on 02/28/2013 when he saw his cardiologist for routine followup interstitial lung disease was suspected and was confirmed on a CT scan of the chest. Therefore he is been referred here  Exposure history and ILD risk factor history  -Remote smoker - Active GERD taking TUMS 2-3 times a month - Exposure to refrigerant: For 40 years although this was not specifically been associated with ILD - Denies asbestos, metal dust, mildew, mold, sugarcane, titanium, beryllium exposure. - Denies being on amiodarone, nitrofurantoin, cancer chemotherapy or radiation therapy a BCG vaccination therapy  Etiological workup - CT scan of the chest 02/28/2013 as read by chest radiologist  Dr. 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  - Excess autoimmune panel March 03, 2013: Is negative   Performance measurement Walking desaturation test 185 feet x3 laps at brisk pace: Lowest pulse ox was 89% on room air  Pulmonary function test 03/07/2013 - Shows moderate restriction -FVC 2.6 L/63%. FEV1 2.2 L/71%. Ratio of 84. Total lung capacity 3.8 L/58%. Patient was unable to perform DLCO.   Overnight oxygen study done 03/04/2013 Lungs continues time with saturation less than 89% was 1 minute and 4 seconds. Longest time total pulse ox less than 89% was 18 minutes and 32 seconds but with only 12 seconds less than 80%.  Past Medical History  Diagnosis Date  . Pure hyperglyceridemia   . S/P aortic valve replacement   . Other left bundle branch block   . Other dyspnea and respiratory abnormality   . Obesity, unspecified   . Unspecified arthropathy, lower leg   . Internal hemorrhoids without mention of complication 02/24/2002    Colonoscopy      Family History  Problem Relation Age of Onset  . Uterine cancer Mother   . Diabetes Sister      History   Social History  . Marital Status: Married    Spouse Name: N/A    Number of Children: N/A  . Years of Education: N/A   Occupational History  . Refrigeration    Social History Main Topics  . Smoking status: Former Smoker -- 1.50 packs/day for 10 years    Types: Cigarettes    Quit date: 05/12/1973  . Smokeless tobacco: Never Used  .  Alcohol Use: 0.0 oz/week     Comment: 2-3 beers occassionally  . Drug Use: No  . Sexual Activity: Not on file   Other Topics Concern  . Not on file   Social History Narrative  . No narrative on file     Allergies  Allergen Reactions  . Penicillins Rash     Outpatient Prescriptions Prior to Visit  Medication Sig Dispense Refill  . aspirin 81 MG tablet Take 81 mg by mouth daily.        Marland Kitchen atorvastatin (LIPITOR) 80 MG tablet Take 1 tablet (80 mg total) by mouth  daily.  90 tablet  4  . celecoxib (CELEBREX) 200 MG capsule Take 1 capsule (200 mg total) by mouth daily.  90 capsule  3  . fenofibrate (TRICOR) 145 MG tablet Take 1 tablet (145 mg total) by mouth daily.  90 tablet  4  . HYDROcodone-acetaminophen (NORCO) 5-325 MG per tablet Take 1 tablet by mouth every 6 (six) hours as needed.        . multivitamin (THERAGRAN) per tablet Take 1 tablet by mouth daily.        . niacin (NIASPAN) 500 MG CR tablet Take 1 tablet (500 mg total) by mouth at bedtime.  90 tablet  4  . quinapril-hydrochlorothiazide (ACCURETIC) 20-12.5 MG per tablet Take 1 tablet by mouth 2 (two) times daily.  180 tablet  4  . warfarin (COUMADIN) 10 MG tablet Take 1 tablet (10 mg total) by mouth daily.  90 tablet  4   No facility-administered medications prior to visit.      Review of Systems  Constitutional: Positive for activity change. Negative for fever, chills, diaphoresis, appetite change, fatigue and unexpected weight change.  HENT: Negative.   Eyes: Negative.   Respiratory: Positive for cough and shortness of breath. Negative for choking, chest tightness and wheezing.   Cardiovascular: Negative for chest pain, palpitations and leg swelling.  Gastrointestinal: Negative.   Endocrine: Negative.   Genitourinary: Negative.   Musculoskeletal: Negative.   Skin: Negative.   Allergic/Immunologic: Negative.   Neurological: Negative.   Hematological: Negative.   Psychiatric/Behavioral: Negative.        Objective:   Physical Exam  Nursing note and vitals reviewed. Constitutional: He is oriented to person, place, and time. He appears well-developed and well-nourished. No distress.  HENT:  Head: Normocephalic and atraumatic.  Right Ear: External ear normal.  Left Ear: External ear normal.  Mouth/Throat: Oropharynx is clear and moist. No oropharyngeal exudate.  Eyes: Conjunctivae and EOM are normal. Pupils are equal, round, and reactive to light. Right eye exhibits no  discharge. Left eye exhibits no discharge. No scleral icterus.  Neck: Normal range of motion. Neck supple. No JVD present. No tracheal deviation present. No thyromegaly present.  Cardiovascular: Normal rate, regular rhythm and intact distal pulses.  Exam reveals no gallop and no friction rub.   No murmur heard. Pulmonary/Chest: Effort normal. No respiratory distress. He has no wheezes. He has rales. He exhibits no tenderness.  R> L crackles 1/4 from base  Abdominal: Soft. Bowel sounds are normal. He exhibits no distension and no mass. There is no tenderness. There is no rebound and no guarding.  Musculoskeletal: Normal range of motion. He exhibits no edema and no tenderness.  Lymphadenopathy:    He has no cervical adenopathy.  Neurological: He is alert and oriented to person, place, and time. He has normal reflexes. No cranial nerve deficit. Coordination normal.  Skin: Skin is warm and  dry. No rash noted. He is not diaphoretic. No erythema. No pallor.  Psychiatric: He has a normal mood and affect. His behavior is normal. Judgment and thought content normal.          Assessment & Plan:

## 2013-03-07 NOTE — Patient Instructions (Signed)
You have a type of lung disease called INterstitial Lung Disease OF these, I do not specficially which type you have, such as NSIP or IPF  Or BOOP YOU do not have autoimmune lung disease I will discuss with radiologist and if they are not confident this is IPF on CT chest, I will get back to you and recommend lung biopsy If you do not hear from [place] by end of week 03/11/13, please call office [phone]

## 2013-03-08 MED ORDER — ALBUTEROL SULFATE HFA 108 (90 BASE) MCG/ACT IN AERS: 2.0000 | INHALATION_SPRAY | Freq: Four times a day (QID) | RESPIRATORY_TRACT | Status: DC | PRN

## 2013-03-08 NOTE — Telephone Encounter (Signed)
Pt's wife cathy returning call can be reached at [phone].Raylene Everts

## 2013-03-08 NOTE — Telephone Encounter (Signed)
rx sent and pt wife is aware to contact Dr. Eden Emms. Carron Curie, CMA

## 2013-03-08 NOTE — Telephone Encounter (Signed)
Albuterol HFA 2 puff prn   Also let them know I realized after he left he is on lisinopril that can make cough worse; they can tallk abotu this with Dr Eden Emms and see if there is an alternative thought primary reason for cough is ILD   Dr. Kalman Shan, M.D., [facility].C.P Pulmonary and Critical Care Medicine Staff Physician [place] [facility] [place] Pulmonary and Critical Care Pager: [phone], If no answer or between  15:00h - 7:00h: call [phone]  03/08/2013 9:51 AM

## 2013-03-08 NOTE — Addendum Note (Signed)
Addended by: Maisie Fus on: 03/08/2013 03:20 PM   Modules accepted: Orders

## 2013-03-08 NOTE — Telephone Encounter (Signed)
MR- please specify albuterol HFA or Albuterol neb? Pt has neither on his med list--states he has never taken albuterol. Only Advair.  Please specify which needs to be called into pharmacy [facility] [address]   Please advise MR thanks.

## 2013-03-09 LAB — RNP ANTIBODY: Ribonucleic Protein(ENA) Antibody, IgG: <1 AI

## 2013-03-11 ENCOUNTER — Telehealth: Payer: Self-pay | Admitting: Internal Medicine

## 2013-03-11 DIAGNOSIS — J849 Interstitial pulmonary disease, unspecified: Secondary | ICD-10-CM

## 2013-03-11 NOTE — Telephone Encounter (Signed)
Please tell wife that I hve discussed with radioologist. THey are not sure this is definite IPF So best is to go ahead with surgical lung bioppsy evaluation. Their preeferred is Dr Morton Peters. Let me know once appt is booked with Dr Zenaida Niece Tright so I cna talk to Dr Morton Peters. Patient will need fu 2weeks after biopsy; so some 3 weeks after the [facility] Tright appt   Dr. Kalman Shan, M.D., [facility].C.P Pulmonary and Critical Care Medicine Staff Physician [place] [place] Pulmonary and Critical Care Pager: [phone], If no answer or between  15:00h - 7:00h: call [phone]  03/11/2013 2:02 PM

## 2013-03-11 NOTE — Telephone Encounter (Signed)
No bronch  Surgical lung bioposy is "Amb ref thoracic surgery"  . Dx: ILD. Instructions; t os ee Dr Morton Peters - his former patient   Dr. Kalman Shan, M.D., [facility].C.P Pulmonary and Critical Care Medicine Staff Physician [place] [facility] [place] Pulmonary and Critical Care Pager: [phone], If no answer or between  15:00h - 7:00h: call [phone]  03/11/2013 2:37 PM

## 2013-03-11 NOTE — Telephone Encounter (Signed)
MR ---  pts wife is aware of recs from you.   Please clarify --would you like a bronch scheduled? Or a different procedure?  thanks

## 2013-03-11 NOTE — Telephone Encounter (Signed)
Referral placed to PCC's.  

## 2013-03-15 ENCOUNTER — Ambulatory Visit (HOSPITAL_COMMUNITY): Payer: Medicare Other | Attending: Cardiovascular Disease | Admitting: Radiology

## 2013-03-15 ENCOUNTER — Encounter: Payer: Medicare Other | Admitting: Cardiothoracic Surgery

## 2013-03-15 DIAGNOSIS — Z954 Presence of other heart-valve replacement: Secondary | ICD-10-CM | POA: Insufficient documentation

## 2013-03-15 DIAGNOSIS — I10 Essential (primary) hypertension: Secondary | ICD-10-CM | POA: Insufficient documentation

## 2013-03-15 DIAGNOSIS — J849 Interstitial pulmonary disease, unspecified: Secondary | ICD-10-CM

## 2013-03-15 DIAGNOSIS — I447 Left bundle-branch block, unspecified: Secondary | ICD-10-CM | POA: Insufficient documentation

## 2013-03-15 DIAGNOSIS — I359 Nonrheumatic aortic valve disorder, unspecified: Secondary | ICD-10-CM | POA: Insufficient documentation

## 2013-03-15 DIAGNOSIS — I079 Rheumatic tricuspid valve disease, unspecified: Secondary | ICD-10-CM | POA: Insufficient documentation

## 2013-03-15 DIAGNOSIS — Z87891 Personal history of nicotine dependence: Secondary | ICD-10-CM | POA: Insufficient documentation

## 2013-03-15 DIAGNOSIS — R0609 Other forms of dyspnea: Secondary | ICD-10-CM | POA: Insufficient documentation

## 2013-03-15 DIAGNOSIS — R0989 Other specified symptoms and signs involving the circulatory and respiratory systems: Secondary | ICD-10-CM | POA: Insufficient documentation

## 2013-03-15 NOTE — Progress Notes (Signed)
Echocardiogram performed.  

## 2013-03-16 ENCOUNTER — Ambulatory Visit (INDEPENDENT_AMBULATORY_CARE_PROVIDER_SITE_OTHER): Payer: Medicare Other | Admitting: Cardiovascular Disease

## 2013-03-16 ENCOUNTER — Encounter: Payer: Self-pay | Admitting: Cardiovascular Disease

## 2013-03-16 VITALS — BP 123/82 | HR 84 | Ht 67.0 in | Wt 217.4 lb

## 2013-03-16 DIAGNOSIS — I447 Left bundle-branch block, unspecified: Secondary | ICD-10-CM

## 2013-03-16 DIAGNOSIS — Z954 Presence of other heart-valve replacement: Secondary | ICD-10-CM

## 2013-03-16 DIAGNOSIS — J8410 Pulmonary fibrosis, unspecified: Secondary | ICD-10-CM

## 2013-03-16 DIAGNOSIS — Z23 Encounter for immunization: Secondary | ICD-10-CM

## 2013-03-16 DIAGNOSIS — I10 Essential (primary) hypertension: Secondary | ICD-10-CM

## 2013-03-16 DIAGNOSIS — J849 Interstitial pulmonary disease, unspecified: Secondary | ICD-10-CM

## 2013-03-16 NOTE — Assessment & Plan Note (Signed)
Gradients have been a bit high with mild AR Continue SBE  F/U echo in a year

## 2013-03-16 NOTE — Assessment & Plan Note (Signed)
ILD:  Biopsy per PVT then decide on steroids and or perfenidine

## 2013-03-16 NOTE — Progress Notes (Signed)
Patient ID: Dale [name], male   DOB: Aug 31, 1945, 67 y.o.   MRN: [id number] Dale [name] is seen today for F.U of AVR in 1996 he had a cath in 2009 with normal cors. His weight is up again 12 lbs and he contnues to have poor diet with lots of bread and carbs. He denies SSCP, dyspnea, edema or syncope. His INR has been Rx. We talked about Pradaxa but I did not encourage it due to inability to reverse it. He stopped taking his Toprol two months ago. He thinks his palpitations and skipped beats were from Darvocet which was taken off the market. He had medial meniscus surgery to his right knee with less than perfect results. 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 Pneumonia Has not responded to antibiotics, prednisone and holding ACE. No fever Non productive cough .   CXR 10/10 with pneumonitis   Central vascular congestion. Mild interstitial prominence bilaterally suspicious for mild edema or pneumonitis. There is superimposed hazy airspace is in right upper lobe. Infiltrate/pneumonia is suspected. Follow-up to resolution is Recommended.  02/28/13 BNP 168   Echo 03/15/13 with EF 40% and valve ok Study Conclusions  - Left ventricle: The cavity size was mildly dilated. Wall thickness was increased in a pattern of mild LVH. Systolic function was normal. The estimated ejection fraction was 40%. Diffuse hypokinesis. - Aortic valve: Tissue AVR stable with mild AR and gradient not much different than 2008 - Left atrium: The atrium was mildly dilated. - Right atrium: The atrium was mildly dilated. - Atrial septum: No defect or patent foramen ovale was identified.  Improved but seeing PVT for possible lung biopsy.  Back on beta blocker ACE and diuretic  ROS: Denies fever, malais, weight loss, blurry vision, decreased visual acuity, cough, sputum, SOB,  hemoptysis, pleuritic pain, palpitaitons, heartburn, abdominal pain, melena, lower extremity edema, claudication, or rash.  All other systems reviewed and negative  General: Affect appropriate Obese white male  HEENT: normal Neck supple with no adenopathy JVP normal no bruits no thyromegaly Lungs clear with no wheezing and good diaphragmatic motion Heart:  S1/S2  AVR no AR murmur, no rub, gallop or click PMI normal Abdomen: benighn, BS positve, no tenderness, no AAA no bruit.  No HSM or HJR Distal pulses intact with no bruits No edema Neuro non-focal Skin warm and dry No muscular weakness   Current Outpatient Prescriptions  Medication Sig Dispense Refill  . albuterol (VENTOLIN HFA) 108 (90 BASE) MCG/ACT inhaler Inhale 2 puffs into the lungs every 6 (six) hours as needed for wheezing.  1 Inhaler  6  . aspirin 81 MG tablet Take 81 mg by mouth daily.        Marland Kitchen atorvastatin (LIPITOR) 80 MG tablet Take 1 tablet (80 mg total) by mouth daily.  90 tablet  4  . celecoxib (CELEBREX) 200 MG capsule Take 1 capsule (200 mg total) by mouth daily.  90 capsule  3  . fenofibrate (TRICOR) 145 MG tablet Take 1 tablet (145 mg total) by mouth daily.  90 tablet  4  . HYDROcodone-acetaminophen (NORCO) 5-325 MG per tablet Take 1 tablet by mouth every 6 (six) hours as needed.        . multivitamin (THERAGRAN) per tablet Take 1 tablet by mouth daily.        . quinapril-hydrochlorothiazide (  ACCURETIC) 20-12.5 MG per tablet Take 1 tablet by mouth 2 (two) times daily.  180 tablet  4  . warfarin (COUMADIN) 10 MG tablet Take 1 tablet (10 mg total) by mouth daily.  90 tablet  4  . niacin (NIASPAN) 500 MG CR tablet Take 1 tablet (500 mg total) by mouth at bedtime.  90 tablet  4   No current facility-administered medications for this visit.    Allergies  Penicillins  Electrocardiogram:  02/28/13 SR rate 64 first degree AV block  Assessment and Plan

## 2013-03-16 NOTE — Assessment & Plan Note (Signed)
Well controlled.  Continue current medications and low sodium Dash type diet.    

## 2013-03-16 NOTE — Assessment & Plan Note (Signed)
Last ECG with more ICLBBB IVCD Some conduction disease as indicated by long PR Stable f/u ECG 6 months

## 2013-03-16 NOTE — Patient Instructions (Signed)
Your physician recommends that you schedule a follow-up appointment in:  NEXT AVAILABLE WITH DR [facility] Your physician recommends that you continue on your current medications as directed. Please refer to the Current Medication list given to you today. Your physician recommends that you return for lab work in: BMET  BNP    6-8 WEEKS  HAVE DONE   BETWEEN   DEC  17-31

## 2013-03-18 ENCOUNTER — Other Ambulatory Visit: Payer: Self-pay | Admitting: *Deleted

## 2013-03-21 ENCOUNTER — Other Ambulatory Visit: Payer: Self-pay | Admitting: *Deleted

## 2013-03-21 ENCOUNTER — Encounter: Payer: Self-pay | Admitting: Cardiothoracic Surgery

## 2013-03-21 ENCOUNTER — Institutional Professional Consult (permissible substitution) (INDEPENDENT_AMBULATORY_CARE_PROVIDER_SITE_OTHER): Payer: Medicare Other | Admitting: Cardiothoracic Surgery

## 2013-03-21 ENCOUNTER — Encounter: Payer: Medicare Other | Admitting: Cardiothoracic Surgery

## 2013-03-21 ENCOUNTER — Telehealth: Payer: Self-pay | Admitting: Internal Medicine

## 2013-03-21 VITALS — BP 115/80 | HR 85 | Resp 20 | Ht 67.0 in | Wt 212.0 lb

## 2013-03-21 DIAGNOSIS — J8410 Pulmonary fibrosis, unspecified: Secondary | ICD-10-CM

## 2013-03-21 DIAGNOSIS — J849 Interstitial pulmonary disease, unspecified: Secondary | ICD-10-CM

## 2013-03-21 NOTE — Telephone Encounter (Signed)
Spoke with pt's wife. States that she was contacted to have ONO done but was confused about whether or not pt really needed this. Per MR he did want to have this done. Pt's wife will contact DME to have this delivered. Nothing further was needed.

## 2013-03-21 NOTE — Progress Notes (Signed)
PCP is Isabella Stalling, MD Referring Provider is Isabella Stalling, MD  Chief Complaint  Patient presents with  . Interstitial Lung Disease    Surgical eval for ILD, Chest CT 02/28/2013    HPI: The patient is a 67 year old obese Caucasian male presents for evaluation of lung biopsy for his tissue lung disease. The patient developed symptoms of cough and shortness of breath approximately 3 months ago. He was initially treated by his primary care physician with a course of antibiotics and a prednisone taper. This significantly improve his cough he has persistent and progressive shortness of breath with exertion. He underwent careful pulmonary evaluation by Dr. Marchelle Gearing. CT scan showed significant abnormalities consistent with UIP or nonspecific interstitial pneumonia. To define the best therapy a lung biopsy has been requested. The patient was recently evaluated by his cardiologist Dr. Elita Boone in who by echo his EF is 45%. The patient had a St. Jude aortic valve replacement in 1996 and the prosthesis is functioning well. He is in a sinus rhythm. He takes Coumadin 5 mg daily with 10 mg 2 days a week. His INR usually runs 2.5. And autoimmune panel performed by Dr. Marchelle Gearing was negative-nonspecific. There is no family history of interstitial lung disease. The patient has not smoked in over 30 years.  Past Medical History  Diagnosis Date  . Pure hyperglyceridemia   . S/P aortic valve replacement   . Other left bundle branch block   . Other dyspnea and respiratory abnormality   . Obesity, unspecified   . Unspecified arthropathy, lower leg   . Internal hemorrhoids without mention of complication 02/24/2002    Colonoscopy     Past Surgical History  Procedure Laterality Date  . Circumcision  05/01/2006  . Shoulder arthroscopy  11/14/2004    Left shoulder  . Pacemaker removal  09/03/00    followed by removal of both atreal and ventricular pacing leads in a  patient who had evidence of  pacemaker lead crush as well as pocket infection  . Aortic valve replacement    . Right knee      Family History  Problem Relation Age of Onset  . Uterine cancer Mother   . Diabetes Sister     Social History History  Substance Use Topics  . Smoking status: Former Smoker -- 1.50 packs/day for 10 years    Types: Cigarettes    Quit date: 05/12/1973  . Smokeless tobacco: Never Used  . Alcohol Use: 0.0 oz/week     Comment: 2-3 beers occassionally    Current Outpatient Prescriptions  Medication Sig Dispense Refill  . albuterol (VENTOLIN HFA) 108 (90 BASE) MCG/ACT inhaler Inhale 2 puffs into the lungs every 6 (six) hours as needed for wheezing.  1 Inhaler  6  . aspirin 81 MG tablet Take 81 mg by mouth daily.        Marland Kitchen atorvastatin (LIPITOR) 80 MG tablet Take 1 tablet (80 mg total) by mouth daily.  90 tablet  4  . celecoxib (CELEBREX) 200 MG capsule Take 1 capsule (200 mg total) by mouth daily.  90 capsule  3  . fenofibrate (TRICOR) 145 MG tablet Take 1 tablet (145 mg total) by mouth daily.  90 tablet  4  . HYDROcodone-acetaminophen (NORCO) 5-325 MG per tablet Take 1 tablet by mouth every 6 (six) hours as needed.        . multivitamin (THERAGRAN) per tablet Take 1 tablet by mouth daily.        . niacin (NIASPAN)  500 MG CR tablet Take 1 tablet (500 mg total) by mouth at bedtime.  90 tablet  4  . quinapril-hydrochlorothiazide (ACCURETIC) 20-12.5 MG per tablet Take 1 tablet by mouth 2 (two) times daily.  180 tablet  4  . warfarin (COUMADIN) 10 MG tablet Take 1 tablet (10 mg total) by mouth daily.  90 tablet  4   No current facility-administered medications for this visit.    Allergies  Allergen Reactions  . Penicillins Rash    Review of Systems  FVC 2.6 FEV1 2.2 Patient has multiple episodes of chest and lower remedy muscle cramping His weight has been stable he denies fever or hemoptysis He denies any recent bleeding complications from his chronic Coumadin therapy for his  mechanical aVR He is right-hand dominant denies history TIA or stroke BP 115/80  Pulse 85  Resp 20  Ht 5\' 7"  (1.702 m)  Wt 212 lb (96.163 kg)  BMI 33.20 kg/m2  SpO2 96% Physical Exam  General appearance vigorous 82-year-old gentleman no acute distress HEENT normocephalic dentition good pupils equal Neck without JVD mass or bruit Thorax well-healed sternal incision, breath sounds with scattered dry rales, right greater than left Cardiac regular rhythm normal valve closure without AI murmur Abdomen obese soft nontender Extremities without edema cyanosis clubbing or tenderness Neurologic alert and appropriate no focal motor deficit   Diagnostic Tests: Scan of the chest reviewed showing interstitial parenchymal changes bilaterally somewhat right greater than left. No suspicious solitary mass or adenopathy  Impression: Interstitial lung disease of unknown etiology Agree with surgical lung biopsy to help direct therapy We'll schedule surgery later the month the patient's request so that he can complete a previously arranged trip with his family  Plan: Right vath, open lung biopsy November 25 at [facility] hospital

## 2013-03-23 ENCOUNTER — Encounter (HOSPITAL_COMMUNITY): Payer: Self-pay | Admitting: Pharmacy Technician

## 2013-03-28 ENCOUNTER — Telehealth: Payer: Self-pay | Admitting: Internal Medicine

## 2013-03-28 DIAGNOSIS — J849 Interstitial pulmonary disease, unspecified: Secondary | ICD-10-CM

## 2013-03-28 NOTE — Telephone Encounter (Signed)
Overnight oxygen study done 03/23/2013 shows pulse ox less than 88% for 6% of the sleep time at 24 minutes and 48 seconds. Therefore he qualifies for 2 L home oxygen at night. Please set this up   Dr. Kalman Shan, M.D., [facility].C.P Pulmonary and Critical Care Medicine Staff Physician [facility] Pager: [phone], If no answer or between  15:00h - 7:00h: call [phone]  03/28/2013 5:07 PM

## 2013-03-29 NOTE — Telephone Encounter (Signed)
[email or web address].Dale [name], CMA  

## 2013-03-30 ENCOUNTER — Telehealth: Payer: Self-pay | Admitting: Internal Medicine

## 2013-03-30 NOTE — Telephone Encounter (Signed)
I spoke with pt spouse and advised of results. They want order sent to [facility]. Order placed. Carron Curie, CMA

## 2013-03-30 NOTE — Telephone Encounter (Signed)
lmtcb x1 

## 2013-03-30 NOTE — Telephone Encounter (Signed)
Spouse returned call stating that she received a call from [place] stating that they are not contracted with Medicare and therefore unable to provide O2 for pt.  Olegario Messier stated that she was told [facility] may be able to provide service.  Advised Olegario Messier will forward to our [facility] for assistance with this.  Olegario Messier asked that we call her on her cell phone.  Per last phone note dated 11.19.14, [facility] had already called our office with this information and provided spouse with 3 other companies that should be able to provide service.  Also per pt's chart, Almyra Free has already spoken with spouse and she was to call back with a DME preference.  Spoke with Almyra Free and told her about Olegario Messier mentioning [facility].  Per Almyra Free she will work on this in the morning as [facility] is now closed.  Called spoke with Olegario Messier again and informed her that Almyra Free will work on the referral in the morning.  Olegario Messier okay with this and verbalized her understanding.  Nothing further needed at this time; will sign off.

## 2013-03-30 NOTE — Telephone Encounter (Signed)
Spoke with [name] at [facility]. States that unfortuntely, they do not provide O2 for pt's who's primary insuracne carrier is Medicare. She has called and spoke with the pt's wife and advised her of 3 other companies that will help the pt with his O2.  Advised [name] that I will let our PCC's know that pt's with Medicare as their primary insurance will not be able to get O2 at [facility].

## 2013-04-01 ENCOUNTER — Encounter (HOSPITAL_COMMUNITY)
Admission: RE | Admit: 2013-04-01 | Discharge: 2013-04-01 | Disposition: A | Discharge status: Home / Self Care | Payer: Medicare Other | Source: Ambulatory Visit | Attending: Cardiothoracic Surgery | Admitting: Cardiothoracic Surgery

## 2013-04-01 ENCOUNTER — Encounter (HOSPITAL_COMMUNITY): Payer: Self-pay

## 2013-04-01 VITALS — BP 142/85 | HR 67 | Temp 98.1°F | Resp 20 | Ht 67.0 in | Wt 214.4 lb

## 2013-04-01 DIAGNOSIS — J8410 Pulmonary fibrosis, unspecified: Secondary | ICD-10-CM

## 2013-04-01 DIAGNOSIS — Z01812 Encounter for preprocedural laboratory examination: Secondary | ICD-10-CM | POA: Insufficient documentation

## 2013-04-01 DIAGNOSIS — Z01818 Encounter for other preprocedural examination: Secondary | ICD-10-CM | POA: Insufficient documentation

## 2013-04-01 HISTORY — DX: Gastro-esophageal reflux disease without esophagitis: K21.9

## 2013-04-01 HISTORY — DX: Pulmonary fibrosis, unspecified: J84.10

## 2013-04-01 HISTORY — DX: Cardiac murmur, unspecified: R01.1

## 2013-04-01 HISTORY — DX: Unspecified osteoarthritis, unspecified site: M19.90

## 2013-04-01 HISTORY — DX: Unspecified hemorrhoids: K64.9

## 2013-04-01 LAB — BLOOD GAS, ARTERIAL
Acid-Base Excess: 1.2 mmol/L (ref 0.0–2.0)
Bicarbonate: 24.8 mEq/L — ABNORMAL HIGH (ref 20.0–24.0)
Drawn by: 344381
O2 Saturation: 95.2 %
Patient temperature: 98.6
TCO2: 25.9 mmol/L (ref 0–100)
pCO2 arterial: 35.8 mmHg (ref 35.0–45.0)
pH, Arterial: 7.455 — ABNORMAL HIGH (ref 7.350–7.450)
pO2, Arterial: 72.2 mmHg — ABNORMAL LOW (ref 80.0–100.0)

## 2013-04-01 LAB — COMPREHENSIVE METABOLIC PANEL
ALT: 30 U/L (ref 0–53)
AST: 46 U/L — ABNORMAL HIGH (ref 0–37)
Albumin: 4.2 g/dL (ref 3.5–5.2)
Alkaline Phosphatase: 58 U/L (ref 39–117)
BUN: 16 mg/dL (ref 6–23)
CO2: 22 mEq/L (ref 19–32)
Calcium: 9.1 mg/dL (ref 8.4–10.5)
Chloride: 105 mEq/L (ref 96–112)
Creatinine, Ser: 0.75 mg/dL (ref 0.50–1.35)
GFR calc Af Amer: >90 mL/min (ref >90)
GFR calc non Af Amer: >90 mL/min (ref >90)
Glucose, Bld: 96 mg/dL (ref 70–99)
Potassium: 3.8 mEq/L (ref 3.5–5.1)
Sodium: 141 mEq/L (ref 135–145)
Total Bilirubin: 0.6 mg/dL (ref 0.3–1.2)
Total Protein: 7.1 g/dL (ref 6.0–8.3)

## 2013-04-01 LAB — URINALYSIS, ROUTINE W REFLEX MICROSCOPIC
Bilirubin Urine: NEGATIVE
Glucose, UA: NEGATIVE mg/dL
Hgb urine dipstick: NEGATIVE
Ketones, ur: NEGATIVE mg/dL
Leukocytes, UA: NEGATIVE
Nitrite: NEGATIVE
Protein, ur: NEGATIVE mg/dL
Specific Gravity, Urine: 1.017 (ref 1.005–1.030)
Urobilinogen, UA: 1 mg/dL (ref 0.0–1.0)
pH: 5.5 (ref 5.0–8.0)

## 2013-04-01 LAB — CBC
HCT: 41.1 % (ref 39.0–52.0)
Hemoglobin: 14.7 g/dL (ref 13.0–17.0)
MCH: 31.3 pg (ref 26.0–34.0)
MCHC: 35.8 g/dL (ref 30.0–36.0)
MCV: 87.4 fL (ref 78.0–100.0)
Platelets: 275 10*3/uL (ref 150–400)
RBC: 4.7 MIL/uL (ref 4.22–5.81)
RDW: 13.4 % (ref 11.5–15.5)
WBC: 12.3 10*3/uL — ABNORMAL HIGH (ref 4.0–10.5)

## 2013-04-01 LAB — SURGICAL PCR SCREEN
MRSA, PCR: NEGATIVE
Staphylococcus aureus: NEGATIVE

## 2013-04-01 LAB — PROTIME-INR
INR: 1.3 (ref 0.00–1.49)
Prothrombin Time: 15.9 seconds — ABNORMAL HIGH (ref 11.6–15.2)

## 2013-04-01 LAB — TYPE AND SCREEN
ABO/RH(D): O POS
Antibody Screen: NEGATIVE

## 2013-04-01 LAB — APTT: aPTT: 30 seconds (ref 24–37)

## 2013-04-01 LAB — ABO/RH: ABO/RH(D): O POS

## 2013-04-01 NOTE — Pre-Procedure Instructions (Signed)
Dale [name]  04/01/2013   Your procedure is scheduled on: Tuesday, November 25th.  Report to [facility], Main Entrance [facility] "[facility]" at 5:30 AM.  Call this number if you have problems the morning of surgery: [phone]   Remember:   Do not eat food or drink liquids after midnight.   Take these medicines the morning of surgery with A SIP OF WATER: Take if needed: Oxycodone- Acetaminophen.  Stop taking Aspirin, Coumadin, Plavix, Effient and Herbal medications.  Do not take any NSAIDs ie: Ibuprofen,  Advil,Naproxen or any medication containing Aspirin.   Do not wear jewelry, make-up or nail polish.  Do not wear lotions, powders, or perfumes. You may wear deodorant.  Do not shave 48 hours prior to surgery. Men may shave face and neck.  Do not bring valuables to the hospital.  [facility] is not responsible  for any belongings or valuables.               Contacts, dentures or bridgework may not be worn into surgery.  Leave suitcase in the car. After surgery it may be brought to your room.  For patients admitted to the hospital, discharge time is determined by your treatment team.                Special Instructions: Shower using CHG 2 nights before surgery and the night before surgery.  If you shower the day of surgery use CHG.  Use special wash - you have one bottle of CHG for all showers.  You should use approximately 1/3 of the bottle for each shower.   Please read over the following fact sheets that you were given: Pain Booklet, Coughing and Deep Breathing, Blood Transfusion Information and Surgical Site Infection Prevention

## 2013-04-01 NOTE — Progress Notes (Signed)
Patient said that he was instructed to that Quinapril- HCTZ the am of surgery, by Dr Zenaida Niece -Trigt.

## 2013-04-04 ENCOUNTER — Encounter (HOSPITAL_COMMUNITY): Payer: Self-pay | Admitting: Certified Registered Nurse Anesthetist

## 2013-04-04 MED ORDER — VANCOMYCIN HCL IN DEXTROSE 1-5 GM/200ML-% IV SOLN
1000.0000 mg | INTRAVENOUS | Status: AC
  Administered 2013-04-05: 1000 mg via INTRAVENOUS
  Filled 2013-04-04: qty 200

## 2013-04-05 ENCOUNTER — Encounter (HOSPITAL_COMMUNITY): Payer: Self-pay | Admitting: *Deleted

## 2013-04-05 ENCOUNTER — Inpatient Hospital Stay (HOSPITAL_COMMUNITY): Payer: Medicare Other

## 2013-04-05 ENCOUNTER — Encounter (HOSPITAL_COMMUNITY): Payer: Medicare Other | Admitting: Certified Registered Nurse Anesthetist

## 2013-04-05 ENCOUNTER — Inpatient Hospital Stay (HOSPITAL_COMMUNITY): Payer: Medicare Other | Admitting: Certified Registered Nurse Anesthetist

## 2013-04-05 ENCOUNTER — Inpatient Hospital Stay (HOSPITAL_COMMUNITY)
Admission: RE | Admit: 2013-04-05 | Discharge: 2013-04-09 | DRG: 164 | Disposition: A | Discharge status: Home / Self Care | Payer: Medicare Other | Source: Ambulatory Visit | Attending: Cardiothoracic Surgery | Admitting: Cardiothoracic Surgery

## 2013-04-05 ENCOUNTER — Encounter (HOSPITAL_COMMUNITY)
Admission: RE | Disposition: A | Discharge status: Home / Self Care | Payer: Self-pay | Source: Ambulatory Visit | Attending: Cardiothoracic Surgery

## 2013-04-05 DIAGNOSIS — J8410 Pulmonary fibrosis, unspecified: Secondary | ICD-10-CM

## 2013-04-05 DIAGNOSIS — Z6833 Body mass index (BMI) 33.0-33.9, adult: Secondary | ICD-10-CM

## 2013-04-05 DIAGNOSIS — Z7982 Long term (current) use of aspirin: Secondary | ICD-10-CM

## 2013-04-05 DIAGNOSIS — M1710 Unilateral primary osteoarthritis, unspecified knee: Secondary | ICD-10-CM | POA: Diagnosis present

## 2013-04-05 DIAGNOSIS — Z7901 Long term (current) use of anticoagulants: Secondary | ICD-10-CM

## 2013-04-05 DIAGNOSIS — Z87891 Personal history of nicotine dependence: Secondary | ICD-10-CM

## 2013-04-05 DIAGNOSIS — E669 Obesity, unspecified: Secondary | ICD-10-CM | POA: Diagnosis present

## 2013-04-05 DIAGNOSIS — L02519 Cutaneous abscess of unspecified hand: Secondary | ICD-10-CM | POA: Diagnosis present

## 2013-04-05 DIAGNOSIS — I509 Heart failure, unspecified: Secondary | ICD-10-CM | POA: Diagnosis present

## 2013-04-05 DIAGNOSIS — Z954 Presence of other heart-valve replacement: Secondary | ICD-10-CM

## 2013-04-05 DIAGNOSIS — K219 Gastro-esophageal reflux disease without esophagitis: Secondary | ICD-10-CM | POA: Diagnosis present

## 2013-04-05 DIAGNOSIS — J84112 Idiopathic pulmonary fibrosis: Secondary | ICD-10-CM | POA: Insufficient documentation

## 2013-04-05 HISTORY — DX: Cellulitis of left upper limb: L03.114

## 2013-04-05 HISTORY — PX: VIDEO ASSISTED THORACOSCOPY: SHX5073

## 2013-04-05 LAB — POCT I-STAT 3, ART BLOOD GAS (G3+)
Bicarbonate: 26.4 mEq/L — ABNORMAL HIGH (ref 20.0–24.0)
O2 Saturation: 97 %
Patient temperature: 97.5
TCO2: 28 mmol/L (ref 0–100)
pCO2 arterial: 46.4 mmHg — ABNORMAL HIGH (ref 35.0–45.0)
pH, Arterial: 7.361 (ref 7.350–7.450)
pO2, Arterial: 88 mmHg (ref 80.0–100.0)

## 2013-04-05 LAB — POCT I-STAT 7, (LYTES, BLD GAS, ICA,H+H)
Acid-base deficit: 3 mmol/L — ABNORMAL HIGH (ref 0.0–2.0)
Bicarbonate: 23.7 mEq/L (ref 20.0–24.0)
Calcium, Ion: 1.19 mmol/L (ref 1.13–1.30)
HCT: 38 % — ABNORMAL LOW (ref 39.0–52.0)
Hemoglobin: 12.9 g/dL — ABNORMAL LOW (ref 13.0–17.0)
O2 Saturation: 99 %
Patient temperature: 96.3
Potassium: 3.6 mEq/L (ref 3.5–5.1)
Sodium: 142 mEq/L (ref 135–145)
TCO2: 25 mmol/L (ref 0–100)
pCO2 arterial: 44.3 mmHg (ref 35.0–45.0)
pH, Arterial: 7.33 — ABNORMAL LOW (ref 7.350–7.450)
pO2, Arterial: 131 mmHg — ABNORMAL HIGH (ref 80.0–100.0)

## 2013-04-05 LAB — POCT I-STAT 4, (NA,K, GLUC, HGB,HCT)
Glucose, Bld: 150 mg/dL — ABNORMAL HIGH (ref 70–99)
HCT: 39 % (ref 39.0–52.0)
Hemoglobin: 13.3 g/dL (ref 13.0–17.0)
Potassium: 3.8 mEq/L (ref 3.5–5.1)
Sodium: 141 mEq/L (ref 135–145)

## 2013-04-05 LAB — HEPARIN LEVEL (UNFRACTIONATED): Heparin Unfractionated: <0.1 IU/mL — ABNORMAL LOW (ref 0.30–0.70)

## 2013-04-05 SURGERY — VIDEO ASSISTED THORACOSCOPY
Anesthesia: General | Site: Chest | Laterality: Right | Wound class: Clean Contaminated

## 2013-04-05 MED ORDER — ALBUMIN HUMAN 5 % IV SOLN
INTRAVENOUS | Status: DC | PRN
  Administered 2013-04-05: 09:00:00 via INTRAVENOUS

## 2013-04-05 MED ORDER — OXYCODONE-ACETAMINOPHEN 5-325 MG PO TABS
1.0000 | ORAL_TABLET | ORAL | Status: DC | PRN
  Filled 2013-04-05: qty 1
  Filled 2013-04-05 (×6): qty 2
  Filled 2013-04-05: qty 1

## 2013-04-05 MED ORDER — POTASSIUM CHLORIDE 10 MEQ/50ML IV SOLN
10.0000 meq | Freq: Every day | INTRAVENOUS | Status: DC | PRN
  Administered 2013-04-07: 10 meq via INTRAVENOUS
  Filled 2013-04-05 (×3): qty 50

## 2013-04-05 MED ORDER — ONDANSETRON HCL 4 MG/2ML IJ SOLN
4.0000 mg | Freq: Four times a day (QID) | INTRAMUSCULAR | Status: DC | PRN
  Filled 2013-04-05: qty 2

## 2013-04-05 MED ORDER — ACETAMINOPHEN 500 MG PO TABS
1000.0000 mg | ORAL_TABLET | Freq: Four times a day (QID) | ORAL | Status: AC
  Administered 2013-04-05 – 2013-04-06 (×3): 1000 mg via ORAL
  Filled 2013-04-05 (×3): qty 2

## 2013-04-05 MED ORDER — WARFARIN SODIUM 7.5 MG PO TABS
7.5000 mg | ORAL_TABLET | Freq: Once | ORAL | Status: AC
  Administered 2013-04-05: 7.5 mg via ORAL
  Filled 2013-04-05: qty 1

## 2013-04-05 MED ORDER — FENTANYL 10 MCG/ML IV SOLN
INTRAVENOUS | Status: DC
  Administered 2013-04-05: 60 ug via INTRAVENOUS
  Administered 2013-04-05: 15 ug via INTRAVENOUS
  Administered 2013-04-05: 80 ug via INTRAVENOUS
  Administered 2013-04-05: 10:00:00 via INTRAVENOUS
  Administered 2013-04-06: 30 ug via INTRAVENOUS
  Administered 2013-04-06: 10 ug via INTRAVENOUS
  Administered 2013-04-06: 130 ug via INTRAVENOUS
  Administered 2013-04-06: 40 ug via INTRAVENOUS
  Administered 2013-04-06: 50 ug via INTRAVENOUS
  Administered 2013-04-06: 13:00:00 via INTRAVENOUS
  Administered 2013-04-06: 30 ug via INTRAVENOUS
  Administered 2013-04-06: 90 ug via INTRAVENOUS
  Administered 2013-04-07 (×2): 45 ug via INTRAVENOUS
  Administered 2013-04-07: 70 ug via INTRAVENOUS
  Filled 2013-04-05 (×2): qty 50

## 2013-04-05 MED ORDER — TRAMADOL HCL 50 MG PO TABS: 50.0000 mg | ORAL_TABLET | Freq: Four times a day (QID) | ORAL | Status: DC | PRN

## 2013-04-05 MED ORDER — LISINOPRIL 20 MG PO TABS
20.0000 mg | ORAL_TABLET | Freq: Every day | ORAL | Status: DC
  Administered 2013-04-06 – 2013-04-09 (×4): 20 mg via ORAL
  Filled 2013-04-05 (×4): qty 1

## 2013-04-05 MED ORDER — DEXTROSE-NACL 5-0.9 % IV SOLN
INTRAVENOUS | Status: DC
  Administered 2013-04-05 – 2013-04-06 (×2): via INTRAVENOUS

## 2013-04-05 MED ORDER — ASPIRIN EC 81 MG PO TBEC
81.0000 mg | DELAYED_RELEASE_TABLET | Freq: Every day | ORAL | Status: DC
  Administered 2013-04-06 – 2013-04-09 (×4): 81 mg via ORAL
  Filled 2013-04-05 (×4): qty 1

## 2013-04-05 MED ORDER — CALCIUM CARBONATE ANTACID 500 MG PO CHEW
1.0000 | CHEWABLE_TABLET | ORAL | Status: DC | PRN
  Administered 2013-04-06 – 2013-04-08 (×3): 200 mg via ORAL
  Filled 2013-04-05 (×3): qty 1

## 2013-04-05 MED ORDER — FENTANYL CITRATE 0.05 MG/ML IJ SOLN
INTRAMUSCULAR | Status: DC | PRN
  Administered 2013-04-05 (×4): 50 ug via INTRAVENOUS
  Administered 2013-04-05: 25 ug via INTRAVENOUS
  Administered 2013-04-05: 50 ug via INTRAVENOUS

## 2013-04-05 MED ORDER — ASPIRIN 81 MG PO TABS: 81.0000 mg | ORAL_TABLET | Freq: Every day | ORAL | Status: DC

## 2013-04-05 MED ORDER — HEPARIN (PORCINE) IN NACL 100-0.45 UNIT/ML-% IJ SOLN
1800.0000 [IU]/h | INTRAMUSCULAR | Status: DC
  Administered 2013-04-05: 1250 [IU]/h via INTRAVENOUS
  Administered 2013-04-06: 1500 [IU]/h via INTRAVENOUS
  Administered 2013-04-06: 1300 [IU]/h via INTRAVENOUS
  Administered 2013-04-07 – 2013-04-09 (×4): 1800 [IU]/h via INTRAVENOUS
  Filled 2013-04-05 (×9): qty 250

## 2013-04-05 MED ORDER — DIPHENHYDRAMINE HCL 50 MG/ML IJ SOLN
12.5000 mg | Freq: Four times a day (QID) | INTRAMUSCULAR | Status: DC | PRN
  Filled 2013-04-05: qty 0.25

## 2013-04-05 MED ORDER — METOCLOPRAMIDE HCL 5 MG/ML IJ SOLN
INTRAMUSCULAR | Status: AC
  Filled 2013-04-05: qty 2

## 2013-04-05 MED ORDER — LACTATED RINGERS IV SOLN
INTRAVENOUS | Status: DC | PRN
  Administered 2013-04-05: 07:00:00 via INTRAVENOUS

## 2013-04-05 MED ORDER — ATORVASTATIN CALCIUM 80 MG PO TABS
80.0000 mg | ORAL_TABLET | Freq: Every day | ORAL | Status: DC
  Administered 2013-04-06 – 2013-04-09 (×4): 80 mg via ORAL
  Filled 2013-04-05 (×4): qty 1

## 2013-04-05 MED ORDER — NEOSTIGMINE METHYLSULFATE 1 MG/ML IJ SOLN
INTRAMUSCULAR | Status: DC | PRN
  Administered 2013-04-05: 5 mg via INTRAVENOUS

## 2013-04-05 MED ORDER — GLYCOPYRROLATE 0.2 MG/ML IJ SOLN
INTRAMUSCULAR | Status: DC | PRN
  Administered 2013-04-05: 1 mg via INTRAVENOUS

## 2013-04-05 MED ORDER — WARFARIN SODIUM 10 MG PO TABS: 10.0000 mg | ORAL_TABLET | Freq: Once | ORAL | Status: DC

## 2013-04-05 MED ORDER — METOCLOPRAMIDE HCL 5 MG/ML IJ SOLN
10.0000 mg | Freq: Once | INTRAMUSCULAR | Status: AC | PRN
  Administered 2013-04-05: 10 mg via INTRAVENOUS

## 2013-04-05 MED ORDER — DIPHENHYDRAMINE HCL 12.5 MG/5ML PO ELIX
12.5000 mg | ORAL_SOLUTION | Freq: Four times a day (QID) | ORAL | Status: DC | PRN
  Administered 2013-04-06: 12.5 mg via ORAL
  Filled 2013-04-05 (×2): qty 5

## 2013-04-05 MED ORDER — SODIUM CHLORIDE 0.9 % IJ SOLN: 9.0000 mL | INTRAMUSCULAR | Status: DC | PRN

## 2013-04-05 MED ORDER — NIACIN ER (ANTIHYPERLIPIDEMIC) 500 MG PO TBCR
500.0000 mg | EXTENDED_RELEASE_TABLET | Freq: Every day | ORAL | Status: DC
  Administered 2013-04-05 – 2013-04-08 (×4): 500 mg via ORAL
  Filled 2013-04-05 (×5): qty 1

## 2013-04-05 MED ORDER — SUCCINYLCHOLINE CHLORIDE 20 MG/ML IJ SOLN
INTRAMUSCULAR | Status: DC | PRN
  Administered 2013-04-05: 120 mg via INTRAVENOUS

## 2013-04-05 MED ORDER — BISACODYL 5 MG PO TBEC
10.0000 mg | DELAYED_RELEASE_TABLET | Freq: Every day | ORAL | Status: DC
  Administered 2013-04-06 – 2013-04-09 (×4): 10 mg via ORAL
  Filled 2013-04-05 (×4): qty 2

## 2013-04-05 MED ORDER — HYDROMORPHONE HCL PF 1 MG/ML IJ SOLN
0.2500 mg | INTRAMUSCULAR | Status: DC | PRN
  Administered 2013-04-05 (×4): 0.25 mg via INTRAVENOUS

## 2013-04-05 MED ORDER — ALBUTEROL SULFATE HFA 108 (90 BASE) MCG/ACT IN AERS
2.0000 | INHALATION_SPRAY | Freq: Four times a day (QID) | RESPIRATORY_TRACT | Status: DC | PRN
  Filled 2013-04-05: qty 6.7

## 2013-04-05 MED ORDER — SENNOSIDES-DOCUSATE SODIUM 8.6-50 MG PO TABS
1.0000 | ORAL_TABLET | Freq: Every evening | ORAL | Status: DC | PRN
  Filled 2013-04-05: qty 1

## 2013-04-05 MED ORDER — ACETAMINOPHEN 160 MG/5ML PO SOLN: 1000.0000 mg | Freq: Four times a day (QID) | ORAL | Status: AC

## 2013-04-05 MED ORDER — NALOXONE HCL 0.4 MG/ML IJ SOLN
0.4000 mg | INTRAMUSCULAR | Status: DC | PRN
  Filled 2013-04-05: qty 1

## 2013-04-05 MED ORDER — HYDROMORPHONE HCL PF 1 MG/ML IJ SOLN
INTRAMUSCULAR | Status: AC
  Filled 2013-04-05: qty 1

## 2013-04-05 MED ORDER — OXYCODONE HCL 5 MG PO TABS: 5.0000 mg | ORAL_TABLET | ORAL | Status: AC | PRN

## 2013-04-05 MED ORDER — HYDROCHLOROTHIAZIDE 12.5 MG PO CAPS
12.5000 mg | ORAL_CAPSULE | Freq: Every day | ORAL | Status: DC
  Filled 2013-04-05: qty 1

## 2013-04-05 MED ORDER — ADULT MULTIVITAMIN W/MINERALS CH
1.0000 | ORAL_TABLET | Freq: Every day | ORAL | Status: DC
  Administered 2013-04-06 – 2013-04-09 (×4): 1 via ORAL
  Filled 2013-04-05 (×4): qty 1

## 2013-04-05 MED ORDER — WARFARIN - PHARMACIST DOSING INPATIENT
Freq: Every day | Status: DC
  Administered 2013-04-07: 18:00:00

## 2013-04-05 MED ORDER — ONDANSETRON HCL 4 MG/2ML IJ SOLN
4.0000 mg | Freq: Four times a day (QID) | INTRAMUSCULAR | Status: DC | PRN
  Administered 2013-04-06: 4 mg via INTRAVENOUS
  Filled 2013-04-05: qty 2

## 2013-04-05 MED ORDER — MIDAZOLAM HCL 5 MG/5ML IJ SOLN
INTRAMUSCULAR | Status: DC | PRN
  Administered 2013-04-05 (×2): 1 mg via INTRAVENOUS

## 2013-04-05 MED ORDER — VANCOMYCIN HCL IN DEXTROSE 1-5 GM/200ML-% IV SOLN
1000.0000 mg | Freq: Two times a day (BID) | INTRAVENOUS | Status: AC
  Administered 2013-04-05: 1000 mg via INTRAVENOUS
  Filled 2013-04-05: qty 200

## 2013-04-05 MED ORDER — FENOFIBRATE 54 MG PO TABS
54.0000 mg | ORAL_TABLET | Freq: Every day | ORAL | Status: DC
  Administered 2013-04-06 – 2013-04-09 (×4): 54 mg via ORAL
  Filled 2013-04-05 (×5): qty 1

## 2013-04-05 MED ORDER — ONDANSETRON HCL 4 MG/2ML IJ SOLN
INTRAMUSCULAR | Status: DC | PRN
  Administered 2013-04-05: 4 mg via INTRAVENOUS

## 2013-04-05 MED ORDER — MULTIVITAMINS PO TABS: 1.0000 | ORAL_TABLET | Freq: Every day | ORAL | Status: DC

## 2013-04-05 MED ORDER — OXYCODONE-ACETAMINOPHEN 5-325 MG PO TABS
1.0000 | ORAL_TABLET | ORAL | Status: DC | PRN
  Administered 2013-04-06 (×2): 1 via ORAL
  Administered 2013-04-07 (×2): 2 via ORAL
  Administered 2013-04-07: 1 via ORAL
  Administered 2013-04-07 – 2013-04-08 (×5): 2 via ORAL
  Filled 2013-04-05: qty 2
  Filled 2013-04-05: qty 1

## 2013-04-05 MED ORDER — PROPOFOL 10 MG/ML IV BOLUS
INTRAVENOUS | Status: DC | PRN
  Administered 2013-04-05: 110 mg via INTRAVENOUS

## 2013-04-05 MED ORDER — 0.9 % SODIUM CHLORIDE (POUR BTL) OPTIME
TOPICAL | Status: DC | PRN
  Administered 2013-04-05: 2000 mL

## 2013-04-05 MED ORDER — PHENYLEPHRINE HCL 10 MG/ML IJ SOLN
10.0000 mg | INTRAVENOUS | Status: DC | PRN
  Administered 2013-04-05: 10 ug/min via INTRAVENOUS

## 2013-04-05 MED ORDER — ROCURONIUM BROMIDE 100 MG/10ML IV SOLN
INTRAVENOUS | Status: DC | PRN
  Administered 2013-04-05: 10 mg via INTRAVENOUS
  Administered 2013-04-05: 50 mg via INTRAVENOUS

## 2013-04-05 MED ORDER — QUINAPRIL-HYDROCHLOROTHIAZIDE 20-12.5 MG PO TABS: 1.0000 | ORAL_TABLET | Freq: Two times a day (BID) | ORAL | Status: DC

## 2013-04-05 SURGICAL SUPPLY — 69 items
BAG DECANTER FOR FLEXI CONT (MISCELLANEOUS) IMPLANT
BENZOIN TINCTURE PRP APPL 2/3 (GAUZE/BANDAGES/DRESSINGS) ×4 IMPLANT
BLADE SURG 11 STRL SS (BLADE) ×4 IMPLANT
CANISTER SUCTION 2500CC (MISCELLANEOUS) ×4 IMPLANT
CATH KIT ON Q 5IN SLV (PAIN MANAGEMENT) IMPLANT
CATH ROBINSON RED A/P 22FR (CATHETERS) IMPLANT
CATH THORACIC 28FR (CATHETERS) IMPLANT
CATH THORACIC 36FR (CATHETERS) IMPLANT
CATH THORACIC 36FR RT ANG (CATHETERS) IMPLANT
CLIP TI MEDIUM 6 (CLIP) ×4 IMPLANT
CLOSURE STERI-STRIP 1/2X4 (GAUZE/BANDAGES/DRESSINGS) ×1
CLSR STERI-STRIP ANTIMIC 1/2X4 (GAUZE/BANDAGES/DRESSINGS) ×3 IMPLANT
CONT SPEC 4OZ CLIKSEAL STRL BL (MISCELLANEOUS) ×8 IMPLANT
COVER SURGICAL LIGHT HANDLE (MISCELLANEOUS) ×8 IMPLANT
DERMABOND ADVANCED (GAUZE/BANDAGES/DRESSINGS) ×2
DERMABOND ADVANCED .7 DNX12 (GAUZE/BANDAGES/DRESSINGS) ×2 IMPLANT
DRAPE LAPAROSCOPIC ABDOMINAL (DRAPES) ×4 IMPLANT
DRAPE WARM FLUID 44X44 (DRAPE) ×4 IMPLANT
DRSG AQUACEL AG ADV 3.5X14 (GAUZE/BANDAGES/DRESSINGS) IMPLANT
ELECT REM PT RETURN 9FT ADLT (ELECTROSURGICAL) ×4
ELECTRODE REM PT RTRN 9FT ADLT (ELECTROSURGICAL) ×2 IMPLANT
GLOVE BIOGEL M 7.0 STRL (GLOVE) ×4 IMPLANT
GLOVE BIOGEL PI IND STRL 6.5 (GLOVE) ×6 IMPLANT
GLOVE BIOGEL PI IND STRL 7.0 (GLOVE) ×2 IMPLANT
GLOVE BIOGEL PI INDICATOR 6.5 (GLOVE) ×6
GLOVE BIOGEL PI INDICATOR 7.0 (GLOVE) ×2
GLOVE ORTHO TXT STRL SZ7.5 (GLOVE) ×8 IMPLANT
GOWN STRL NON-REIN LRG LVL3 (GOWN DISPOSABLE) ×12 IMPLANT
HANDLE STAPLE ENDO GIA SHORT (STAPLE) ×2
KIT BASIN OR (CUSTOM PROCEDURE TRAY) ×4 IMPLANT
KIT ROOM TURNOVER OR (KITS) ×4 IMPLANT
KIT SUCTION CATH 14FR (SUCTIONS) ×4 IMPLANT
NS IRRIG 1000ML POUR BTL (IV SOLUTION) ×8 IMPLANT
PACK CHEST (CUSTOM PROCEDURE TRAY) ×4 IMPLANT
PAD ARMBOARD 7.5X6 YLW CONV (MISCELLANEOUS) ×8 IMPLANT
RELOAD EGIA 45 MED/THCK PURPLE (STAPLE) ×8 IMPLANT
RELOAD EGIA 60 MED/THCK PURPLE (STAPLE) ×8 IMPLANT
SEALANT SURG COSEAL 4ML (VASCULAR PRODUCTS) ×8 IMPLANT
SOLUTION ANTI FOG 6CC (MISCELLANEOUS) ×4 IMPLANT
SPONGE GAUZE 4X4 12PLY (GAUZE/BANDAGES/DRESSINGS) ×4 IMPLANT
SPONGE TONSIL 1.25 RF SGL STRG (GAUZE/BANDAGES/DRESSINGS) ×4 IMPLANT
STAPLER ENDO GIA 12MM SHORT (STAPLE) ×2 IMPLANT
SUT CHROMIC 3 0 SH 27 (SUTURE) IMPLANT
SUT ETHILON 3 0 PS 1 (SUTURE) IMPLANT
SUT PROLENE 3 0 SH DA (SUTURE) IMPLANT
SUT PROLENE 4 0 RB 1 (SUTURE)
SUT PROLENE 4-0 RB1 .5 CRCL 36 (SUTURE) IMPLANT
SUT SILK  1 MH (SUTURE) ×4
SUT SILK 1 MH (SUTURE) ×4 IMPLANT
SUT SILK 2 0SH CR/8 30 (SUTURE) IMPLANT
SUT SILK 3 0SH CR/8 30 (SUTURE) IMPLANT
SUT VIC AB 1 CTX 18 (SUTURE) IMPLANT
SUT VIC AB 2 TP1 27 (SUTURE) IMPLANT
SUT VIC AB 2-0 CT2 18 VCP726D (SUTURE) ×4 IMPLANT
SUT VIC AB 2-0 CTX 36 (SUTURE) IMPLANT
SUT VIC AB 3-0 SH 8-18 (SUTURE) ×4 IMPLANT
SUT VIC AB 3-0 X1 27 (SUTURE) ×8 IMPLANT
SUT VICRYL 0 UR6 27IN ABS (SUTURE) IMPLANT
SUT VICRYL 2 TP 1 (SUTURE) IMPLANT
SWAB COLLECTION DEVICE MRSA (MISCELLANEOUS) IMPLANT
SYSTEM SAHARA CHEST DRAIN ATS (WOUND CARE) ×4 IMPLANT
TAPE CLOTH SURG 4X10 WHT LF (GAUZE/BANDAGES/DRESSINGS) ×4 IMPLANT
TIP APPLICATOR SPRAY EXTEND 16 (VASCULAR PRODUCTS) IMPLANT
TOWEL OR 17X24 6PK STRL BLUE (TOWEL DISPOSABLE) ×4 IMPLANT
TOWEL OR 17X26 10 PK STRL BLUE (TOWEL DISPOSABLE) ×8 IMPLANT
TRAP SPECIMEN MUCOUS 40CC (MISCELLANEOUS) IMPLANT
TRAY FOLEY CATH 16FRSI W/METER (SET/KITS/TRAYS/PACK) ×4 IMPLANT
TUBE ANAEROBIC SPECIMEN COL (MISCELLANEOUS) IMPLANT
WATER STERILE IRR 1000ML POUR (IV SOLUTION) ×8 IMPLANT

## 2013-04-05 NOTE — Anesthesia Procedure Notes (Signed)
Procedure Name: Intubation Date/Time: 04/05/2013 7:33 AM Performed by: Reine Just Pre-anesthesia Checklist: Patient identified, Emergency Drugs available, Suction available, Patient being monitored and Timeout performed Patient Re-evaluated:Patient Re-evaluated prior to inductionOxygen Delivery Method: Circle system utilized and Simple face mask Preoxygenation: Pre-oxygenation with 100% oxygen Intubation Type: IV induction Ventilation: Mask ventilation with difficulty, Two handed mask ventilation required and Oral airway inserted - appropriate to patient size Laryngoscope Size: Mac and 4 Grade View: Grade II Tube type: Oral Endobronchial tube: Left, Double lumen EBT, EBT position confirmed by auscultation and EBT position confirmed by fiberoptic bronchoscope and 39 Fr Number of attempts: 1 Airway Equipment and Method: Patient positioned with wedge pillow and Stylet Placement Confirmation: ETT inserted through vocal cords under direct vision,  positive ETCO2 and breath sounds checked- equal and bilateral Secured at: 30 cm Tube secured with: Tape Dental Injury: Teeth and Oropharynx as per pre-operative assessment

## 2013-04-05 NOTE — Anesthesia Postprocedure Evaluation (Signed)
  Anesthesia Post-op Note  Patient: Dale [name]  Procedure(s) Performed: Procedure(s): RIGHT VIDEO ASSISTED THORACOSCOPY,RIGHT MIDDLE LOBE & RIGHT UPPER LOBE LUNG BIOPSY (Right)  Patient Location: PACU  Anesthesia Type:General  Level of Consciousness: awake  Airway and Oxygen Therapy: Patient Spontanous Breathing  Post-op Pain: mild  Post-op Assessment: Post-op Vital signs reviewed  Post-op Vital Signs: Reviewed  Complications: No apparent anesthesia complications

## 2013-04-05 NOTE — Progress Notes (Signed)
RT at bedside for NIFF, per Dr Randa Evens, DR. Edwards given lab results

## 2013-04-05 NOTE — Brief Op Note (Signed)
      [address]       [place] [address]             [phone]     04/05/2013  9:22 AM  PATIENT:  Dale [name]  67 y.o. male  PRE-OPERATIVE DIAGNOSIS:  pulmonary fibrosis  POST-OPERATIVE DIAGNOSIS:  pulmonary fibrosis  PROCEDURE:  Procedure(s): RIGHT VIDEO ASSISTED THORACOSCOPY,RML/RUL WEDGE RESECTIONS SURGEON:  Surgeon(s): Kerin Perna, MD  PHYSICIAN ASSISTANT: WAYNE GOLD PA-C  ANESTHESIA:   general  SPECIMEN:  Source of Specimen:  AS ABOVE  DISPOSITION OF SPECIMEN:  Pathology  DRAINS: 1 28 FRENCH Chest Tube(s) in the RIGHT HEMITHORAX   97PATIENT CONDITION:  PACU - hemodynamically stable.  PRE-OPERATIVE WEIGHT: 97kg  COMPLICATIONS: NO KNOWN

## 2013-04-05 NOTE — Progress Notes (Signed)
RT performed a NIF patient pulled a-50 on the NIF.

## 2013-04-05 NOTE — Progress Notes (Signed)
S/p wedge resections  Up in chair  BP 114/73  Pulse 74  Temp(Src) 98.3 F (36.8 C) (Oral)  Resp 21  Ht 5\' 7"  (1.702 m)  Wt 214 lb 6 oz (97.24 kg)  BMI 33.57 kg/m2  SpO2 95%   Intake/Output Summary (Last 24 hours) at 04/05/13 1802 Last data filed at 04/05/13 1700  Gross per 24 hour  Intake 2397.25 ml  Output   1065 ml  Net 1332.25 ml    Stable early postop

## 2013-04-05 NOTE — Progress Notes (Signed)
The patient was examined and preop studies reviewed. There has been no change from the prior exam and the patient is ready for surgery.  Plan Right VATS, lung bx on Dale [name] today

## 2013-04-05 NOTE — Progress Notes (Signed)
Utilization Review Completed.  

## 2013-04-05 NOTE — Transfer of Care (Signed)
Immediate Anesthesia Transfer of Care Note  Patient: Dale [name]  Procedure(s) Performed: Procedure(s): RIGHT VIDEO ASSISTED THORACOSCOPY,RIGHT MIDDLE LOBE & RIGHT UPPER LOBE LUNG BIOPSY (Right)  Patient Location: PACU  Anesthesia Type:General  Level of Consciousness: sedated  Airway & Oxygen Therapy: Patient Spontanous Breathing, Patient connected to face mask oxygen and Patient re-intubated  Post-op Assessment: Report given to PACU RN and Post -op Vital signs reviewed and unstable, Anesthesiologist notified  Post vital signs: Reviewed and unstable  Complications: Patient re-intubated

## 2013-04-05 NOTE — Progress Notes (Addendum)
ANTICOAGULATION CONSULT NOTE - Initial Consult  Pharmacy Consult for Heparin and Coumadin Indication: St Jude AVR - 1996  Allergies  Allergen Reactions  . Penicillins Swelling and Rash    Patient Measurements: Height: 5\' 7"  (170.2 cm) Weight: 214 lb 6 oz (97.24 kg) IBW/kg (Calculated) : 66.1 Heparin Dosing Weight: 87 kg  Vital Signs: Temp: 96.9 F (36.1 C) (11/25 0946) Temp src: Oral (11/25 0548) BP: 120/66 mmHg (11/25 1215) Pulse Rate: 83 (11/25 1215)  Labs:  Recent Labs  04/05/13 1039 04/05/13 1043  HGB 13.3 12.9*  HCT 39.0 38.0*    Estimated Creatinine Clearance: 102.2 ml/min (by C-G formula based on Cr of 0.75).   Medical History: Past Medical History  Diagnosis Date  . Pure hyperglyceridemia   . S/P aortic valve replacement   . Other left bundle branch block   . Other dyspnea and respiratory abnormality   . Obesity, unspecified   . Unspecified arthropathy, lower leg   . Internal hemorrhoids without mention of complication 02/24/2002    Colonoscopy   . Heart murmur   . Pulmonary fibrosis   . GERD (gastroesophageal reflux disease)   . Hemorrhoids   . Arthritis     Medications:  Prescriptions prior to admission  Medication Sig Dispense Refill  . albuterol (VENTOLIN HFA) 108 (90 BASE) MCG/ACT inhaler Inhale 2 puffs into the lungs every 6 (six) hours as needed for wheezing.  1 Inhaler  6  . aspirin 81 MG tablet Take 81 mg by mouth daily.       Marland Kitchen atorvastatin (LIPITOR) 80 MG tablet Take 80 mg by mouth daily.      . calcium carbonate (TUMS - DOSED IN MG ELEMENTAL CALCIUM) 500 MG chewable tablet Chew 1 tablet by mouth as needed for indigestion or heartburn.      . celecoxib (CELEBREX) 200 MG capsule Take 1 capsule (200 mg total) by mouth daily.  90 capsule  3  . docusate sodium (COLACE) 100 MG capsule Take 200 mg by mouth daily.      . fenofibrate (TRICOR) 145 MG tablet Take 145 mg by mouth daily.      Marland Kitchen HYDROcodone-acetaminophen (NORCO) 5-325 MG per  tablet Take 1 tablet by mouth every 6 (six) hours as needed.       . multivitamin (THERAGRAN) per tablet Take 1 tablet by mouth daily.       . niacin (NIASPAN) 500 MG CR tablet Take 500 mg by mouth at bedtime.      . quinapril-hydrochlorothiazide (ACCURETIC) 20-12.5 MG per tablet Take 1 tablet by mouth 2 (two) times daily.      Marland Kitchen warfarin (COUMADIN) 10 MG tablet Take 1 tablet (10 mg total) by mouth daily.  90 tablet  4  . warfarin (COUMADIN) 10 MG tablet Take 5-10 mg by mouth daily. Take 10 mg on Mon., and Thurs.  Take 5 mg on Tues., Wed., Fri., Sat., and Sunday.      Marland Kitchen atorvastatin (LIPITOR) 80 MG tablet Take 1 tablet (80 mg total) by mouth daily.  90 tablet  4  . niacin (NIASPAN) 500 MG CR tablet Take 1 tablet (500 mg total) by mouth at bedtime.  90 tablet  4    Assessment: 67 y.o. male s/p R VATS, lung bx this a.m. in OR to eval interstitial lung disease of unknown etiology. Pt on coumadin PTA for St. Jude AVR (5 mg daily except for 10mg  on Mon and Thur). Pt has been stable on home dose with INR  usually ~2.5. Coumadin has been on hold for procedure for ~1 week. INR 1.3 on 11/21. To begin heparin bridge with no bolus at 1400 (verified time with Dr. Donata Clay). To restart coumadin tonight.  Goal of Therapy:  Heparin level 0.3-0.7 units/ml; INR ~2 per Dr. Donata Clay Monitor platelets by anticoagulation protocol: Yes   Plan:  1. Heparin gtt at 1250 units/hr - to begin at 2pm. No bolus. 2. Coumadin 7.5 mg po tonight. 3. Daily INR, heparin level and CBC 4. Heparin level 6 hours post gtt start.  Christoper Fabian, PharmD, BCPS Clinical pharmacist, pager [phone] 04/05/2013,1:33 PM

## 2013-04-05 NOTE — Progress Notes (Signed)
Pt. Extubated per Dr Randa Evens. 8 liters facemask

## 2013-04-05 NOTE — Preoperative (Signed)
Beta Blockers   Reason not to administer Beta Blockers:Not Applicable 

## 2013-04-05 NOTE — Progress Notes (Signed)
Pt. Chest xray done, loose restrainrs per Dr Randa Evens orders

## 2013-04-05 NOTE — Anesthesia Preprocedure Evaluation (Addendum)
Anesthesia Evaluation  Patient identified by MRN, date of birth, ID band Patient awake    Reviewed: Allergy & Precautions, H&P   Airway Mallampati: II      Dental   Pulmonary shortness of breath and with exertion, pneumonia -, former smoker,  breath sounds clear to auscultation        Cardiovascular hypertension, Rhythm:Regular Rate:Normal     Neuro/Psych    GI/Hepatic Neg liver ROS, GERD-  ,  Endo/Other  negative endocrine ROS  Renal/GU negative Renal ROS     Musculoskeletal   Abdominal   Peds  Hematology   Anesthesia Other Findings   Reproductive/Obstetrics                        Anesthesia Physical Anesthesia Plan  ASA: III  Anesthesia Plan: General   Post-op Pain Management:    Induction: Intravenous  Airway Management Planned: Oral ETT  Additional Equipment:   Intra-op Plan:   Post-operative Plan: Possible Post-op intubation/ventilation  Informed Consent: I have reviewed the patients History and Physical, chart, labs and discussed the procedure including the risks, benefits and alternatives for the proposed anesthesia with the patient or authorized representative who has indicated his/her understanding and acceptance.   Dental advisory given  Plan Discussed with: CRNA, Anesthesiologist and Surgeon  Anesthesia Plan Comments:        Anesthesia Quick Evaluation

## 2013-04-05 NOTE — Progress Notes (Signed)
Heparin level results as <0.1 after rate decrease.  Per Dr Donata Clay heparin should not be adjusted until am.  Will continue gtt at current rate and continue to monitor.  Vernard Gambles, PharmD, BCPS 04/05/2013 11:22 PM

## 2013-04-05 NOTE — Progress Notes (Signed)
Dr Alla German made aware of extubation

## 2013-04-05 NOTE — Progress Notes (Signed)
Pt. Into pacu, sats 30, bagging pt., Dr. Randa Evens at bedside to reintubate patient,pt. Being bagged per CRNA, Pt. Intubated at 0955, per CRNa/ DR. Vantright notifieied.40 sux given., chest tube to suction, pts sats up to 99 after intubation

## 2013-04-06 ENCOUNTER — Encounter (HOSPITAL_COMMUNITY): Payer: Self-pay | Admitting: Cardiothoracic Surgery

## 2013-04-06 ENCOUNTER — Inpatient Hospital Stay (HOSPITAL_COMMUNITY): Payer: Medicare Other

## 2013-04-06 DIAGNOSIS — Z954 Presence of other heart-valve replacement: Secondary | ICD-10-CM

## 2013-04-06 DIAGNOSIS — I509 Heart failure, unspecified: Secondary | ICD-10-CM

## 2013-04-06 DIAGNOSIS — J8410 Pulmonary fibrosis, unspecified: Principal | ICD-10-CM

## 2013-04-06 LAB — PROTIME-INR
INR: 1.02 (ref 0.00–1.49)
Prothrombin Time: 13.2 seconds (ref 11.6–15.2)

## 2013-04-06 LAB — CBC
HCT: 35.6 % — ABNORMAL LOW (ref 39.0–52.0)
Hemoglobin: 12.2 g/dL — ABNORMAL LOW (ref 13.0–17.0)
MCH: 30.7 pg (ref 26.0–34.0)
MCHC: 34.3 g/dL (ref 30.0–36.0)
MCV: 89.7 fL (ref 78.0–100.0)
Platelets: 224 10*3/uL (ref 150–400)
RBC: 3.97 MIL/uL — ABNORMAL LOW (ref 4.22–5.81)
RDW: 13.8 % (ref 11.5–15.5)
WBC: 12.1 10*3/uL — ABNORMAL HIGH (ref 4.0–10.5)

## 2013-04-06 LAB — BASIC METABOLIC PANEL
BUN: 14 mg/dL (ref 6–23)
Chloride: 100 mEq/L (ref 96–112)
Creatinine, Ser: 0.64 mg/dL (ref 0.50–1.35)
GFR calc Af Amer: >90 mL/min (ref >90)
Potassium: 3.1 mEq/L — ABNORMAL LOW (ref 3.5–5.1)

## 2013-04-06 LAB — POCT I-STAT 3, ART BLOOD GAS (G3+)
Acid-Base Excess: 3 mmol/L — ABNORMAL HIGH (ref 0.0–2.0)
Bicarbonate: 28.3 mEq/L — ABNORMAL HIGH (ref 20.0–24.0)
O2 Saturation: 95 %
Patient temperature: 36.4
TCO2: 30 mmol/L (ref 0–100)
pCO2 arterial: 46 mmHg — ABNORMAL HIGH (ref 35.0–45.0)
pH, Arterial: 7.394 (ref 7.350–7.450)
pO2, Arterial: 77 mmHg — ABNORMAL LOW (ref 80.0–100.0)

## 2013-04-06 LAB — HEPARIN LEVEL (UNFRACTIONATED)
Heparin Unfractionated: <0.1 IU/mL — ABNORMAL LOW (ref 0.30–0.70)
Heparin Unfractionated: <0.1 IU/mL — ABNORMAL LOW (ref 0.30–0.70)

## 2013-04-06 MED ORDER — PNEUMOCOCCAL VAC POLYVALENT 25 MCG/0.5ML IJ INJ
0.5000 mL | INJECTION | INTRAMUSCULAR | Status: DC | PRN
  Filled 2013-04-06: qty 0.5

## 2013-04-06 MED ORDER — DEXTROSE-NACL 5-0.9 % IV SOLN
INTRAVENOUS | Status: DC
  Administered 2013-04-07: 20:00:00 via INTRAVENOUS

## 2013-04-06 MED ORDER — WARFARIN SODIUM 7.5 MG PO TABS
7.5000 mg | ORAL_TABLET | Freq: Once | ORAL | Status: AC
  Administered 2013-04-06: 7.5 mg via ORAL
  Filled 2013-04-06: qty 1

## 2013-04-06 MED ORDER — POTASSIUM CHLORIDE 10 MEQ/50ML IV SOLN
10.0000 meq | INTRAVENOUS | Status: AC
  Administered 2013-04-06 (×3): 10 meq via INTRAVENOUS
  Filled 2013-04-06 (×2): qty 50

## 2013-04-06 MED ORDER — TRAMADOL HCL 50 MG PO TABS
50.0000 mg | ORAL_TABLET | Freq: Four times a day (QID) | ORAL | Status: DC | PRN
  Administered 2013-04-08: 50 mg via ORAL
  Filled 2013-04-06: qty 1

## 2013-04-06 MED ORDER — FUROSEMIDE 10 MG/ML IJ SOLN
40.0000 mg | Freq: Every day | INTRAMUSCULAR | Status: DC
  Administered 2013-04-06 – 2013-04-07 (×2): 40 mg via INTRAVENOUS
  Filled 2013-04-06 (×3): qty 4

## 2013-04-06 NOTE — Progress Notes (Signed)
Pt arrived from 2S, VSS up to chair. BRP, became SOBOE, back on O2. Will continue to monitor.

## 2013-04-06 NOTE — Progress Notes (Signed)
[facility] DAILY ICU PROGRESS NOTE                   [address]            [place] [address]          [phone]   1 Day Post-Op Procedure(s) (LRB): RIGHT VIDEO ASSISTED THORACOSCOPY,RIGHT MIDDLE LOBE & RIGHT UPPER LOBE LUNG BIOPSY (Right)  Total Length of Stay:  LOS: 1 day   Subjective: Sore, but otherwise stable.  Objective: Vital signs in last 24 hours: Temp:  [97.5 F (36.4 C)-98.3 F (36.8 C)] 98 F (36.7 C) (11/26 0726) Pulse Rate:  [58-85] 68 (11/26 0800) Cardiac Rhythm:  [-] Normal sinus rhythm (11/26 0600) Resp:  [15-23] 20 (11/26 0800) BP: (102-135)/(51-83) 126/64 mmHg (11/26 0800) SpO2:  [93 %-100 %] 94 % (11/26 0800) Arterial Line BP: (105-138)/(48-75) 126/62 mmHg (11/26 0800) Weight:  [214 lb 6 oz (97.24 kg)] 214 lb 6 oz (97.24 kg) (11/25 1320)  Filed Weights   04/05/13 1320  Weight: 214 lb 6 oz (97.24 kg)    Weight change:    Hemodynamic parameters for last 24 hours:    Intake/Output from previous day: 11/25 0701 - 11/26 0700 In: 3985.5 [P.O.:640; I.V.:2845.5; IV Piggyback:500] Out: 2810 [Urine:2575; Blood:25; Chest Tube:210]  Intake/Output this shift: Total I/O In: -  Out: 835 [Urine:775; Chest Tube:60]  Current Meds: Scheduled Meds: . acetaminophen  1,000 mg Oral Q6H   Or  . acetaminophen (TYLENOL) oral liquid 160 mg/5 mL  1,000 mg Oral Q6H  . aspirin EC  81 mg Oral Daily  . atorvastatin  80 mg Oral Daily  . bisacodyl  10 mg Oral Daily  . fenofibrate  54 mg Oral Daily  . fentaNYL   Intravenous Q4H  . furosemide  40 mg Intravenous Daily  . lisinopril  20 mg Oral Daily  . multivitamin with minerals  1 tablet Oral Daily  . niacin  500 mg Oral QHS  . Warfarin - Pharmacist Dosing Inpatient   Does not apply q1800   Continuous Infusions: . dextrose 5 % and 0.9% NaCl 20 mL/hr at 04/06/13 1000  . heparin 1,000 Units/hr (04/06/13 0500)   PRN Meds:.albuterol, calcium carbonate, diphenhydrAMINE, diphenhydrAMINE, naloxone,  ondansetron (ZOFRAN) IV, oxyCODONE-acetaminophen, oxyCODONE-acetaminophen, potassium chloride, senna-docusate, sodium chloride, traMADol   Physical Exam: General appearance: alert, cooperative and no distress Heart: regular rate and rhythm Lungs: Coarse bilateral BS Wound: Dressed and dry    Lab Results: CBC: Recent Labs  04/05/13 1043 04/06/13 0415  WBC  --  12.1*  HGB 12.9* 12.2*  HCT 38.0* 35.6*  PLT  --  224   BMET:  Recent Labs  04/05/13 1039 04/05/13 1043 04/06/13 0415  NA 141 142 136  K 3.8 3.6 3.1*  CL  --   --  100  CO2  --   --  29  GLUCOSE 150*  --  122*  BUN  --   --  14  CREATININE  --   --  0.64  CALCIUM  --   --  8.5    PT/INR:  Recent Labs  04/06/13 0415  LABPROT 13.2  INR 1.02   Radiology: Dg Chest Port 1 View  04/06/2013   CLINICAL DATA:  Status post VATS with right middle and right upper lobe lung biopsy.  EXAM: PORTABLE CHEST - 1 VIEW  COMPARISON:  Chest x-ray 04/05/2013.  FINDINGS: Patient has been extubated. Right-sided chest tube remains in position tip in  the apex of the right hemithorax There is a right-sided internal jugular central venous catheter with tip terminating in the mid superior vena. Lung volumes are slightly low. Small right-sided pneumothorax (less than 5% the volume right hemithorax) noted. No significant pleural fluid collections. Diffuse interstitial prominence again noted throughout the lungs bilaterally, similar to yesterday's examination, likely to reflect component mild interstitial pulmonary edema superimposed upon a background of underlying chronic interstitial lung disease No definite acute consolidative airspace disease heart size is moderately enlarged. Mediastinal contours remain grossly distorted by patient positioning. Small amount of subcutaneous emphysema the right chest wall  IMPRESSION: 1. Support apparatus, as above. 2. Interval development of small right-sided pleural effusion (less than 5% right hemithorax). 3.  Improved aeration at the left base, likely to reflect resolved atelectasis. The radiographic appearance the chest is otherwise essentially unchanged, as above. These results were called by telephone at the time of interpretation on 04/06/2013 at 8:17 AM to nurse Lelon Mast (for Dr. Kerin Perna), who verbally acknowledged these results.   Electronically Signed   By: Trudie Reed M.D.   On: 04/06/2013 08:19   Dg Chest Portable 1 View  04/05/2013   CLINICAL DATA:  Postop right VATS  EXAM: PORTABLE CHEST - 1 VIEW  COMPARISON:  04/01/2013.  FINDINGS: Low lung volumes. An endotracheal tube is identified with tip approximately 1.5 cm above the carina. A right-sided internal jugular catheter is appreciated with tip projecting spur the course superior vena cava. A right-sided chest tube identified with tip projecting in the right lung apex.  There is no radiographic evidence of appreciable pneumothorax. There is diffuse thickening of interstitial markings, peribronchial cuffing, indistinctness of the pulmonary vasculature. An area of increased density projects within the right lung base. The cardiac silhouette is enlarged. Subcutaneous emphysema is identified within the right axilla consistent with patient's history. Visualized osseous structures unremarkable. Patient is status post median sternotomy.  IMPRESSION: 1. Interstitial findings described above a component which secondary to the patient's chronic interstitial lung disease. A superimposed component of pulmonary edema is also a diagnostic consideration. These findings are accentuated by the patient's low lung volumes. 2. Endotracheal tube 1.5 cm above the carina retraction recommended if clinically warranted 3. Chest tube right lung apex 4. Right internal jugular catheter 5. Atelectasis versus infiltrate left lung base   Electronically Signed   By: Salome Holmes M.D.   On: 04/05/2013 10:27     Assessment/Plan: S/P Procedure(s) (LRB): RIGHT VIDEO  ASSISTED THORACOSCOPY,RIGHT MIDDLE LOBE & RIGHT UPPER LOBE LUNG BIOPSY (Right) Stable, doing well. Continue CT for now, will place to water seal. Mobilize, continue pulm toilet/IS. Tx 3300. Path pending.   Tammie Yanda H 04/06/2013 10:15 AM

## 2013-04-06 NOTE — Op Note (Signed)
NAMEMAKYE, RADLE NO.:  [id number]  MEDICAL RECORD NO.:  [id number]  LOCATION:  2S01C                        FACILITY:  [facility]  PHYSICIAN:  Kerin Perna, M.D.  DATE OF BIRTH:  [birth year]  DATE OF PROCEDURE:  04/05/2013 DATE OF DISCHARGE:                              OPERATIVE REPORT   OPERATION:  Right VATS (video-assisted thoracoscopic surgery) with open lung biopsies of right upper lobe and right middle lobe.  SURGEON:  Kerin Perna, M.D.  ASSISTANT:  Rowe Clack, P.A.-C.  ANESTHESIA:  General by Dr. Michelle Piper.  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  INDICATIONS:  The patient is a 67 year old gentleman with progressive dyspnea on exertion, was found by CT scan to have interstitial pulmonary fibrosis.  His pulmonologist requested lung biopsy to help direct therapy.  I saw the patient in the office and reviewed results of his CT scan as well as reviewed his previous cardiac history and cardiac studies.  I discussed the procedure of right VATS with biopsy with the patient and his wife.  I reviewed the plan for general anesthesia and expected postoperative recovery.  I also discussed with him the specific potential complications of bleeding, prolonged air leak, pneumonia, ventilator dependence, infection, and death.  He understood these issues and agreed to proceed with surgery under what I felt was an informed consent.  OPERATIVE PROCEDURE:  The patient was brought to the operating room, placed supine on the operating table where general anesthesia was induced.  A double-lumen endotracheal tube was positioned by the anesthesiologist.  The patient remained stable.  The right chest was prepped and draped as a sterile field.  A proper time-out was performed.  Three small incisions were made around  the right chest in the anterior axillary line beneath the tip of the scapula into the posterior thorax between the scapula and the spine.  The VATS camera was inserted and the thorax was examined.  The lung appeared to be homogeneous in morphology with some black markings from previous smoking and otherwise no nodularity or acute areas of inflammation were noted.  Using the Endo-GIA stapling devices, wedge biopsy of the right upper lobe and wedge biopsy of the right lower lobe were performed.  The staple lines were reinforced with CoSeal medical adhesive.  A 28-French chest tube was placed and directed to the apex and secured to the skin. The grafts to the VATS incisions were closed in layers using Vicryl.  The chest tube was connected to underwater seal Pleur- Evac drainage.  The patient was then turned supine and extubated by anesthesia, with the plan to return to the recovery room.  Blood loss was minimal and the patient tolerated the procedure well.     Kerin Perna, M.D.     PV/MEDQ  D:  04/05/2013  T:  04/06/2013  Job:  [id number]  cc:   Kalman Shan, MD Noralyn Pick. Eden Emms, MD, [facility]

## 2013-04-06 NOTE — Progress Notes (Signed)
Patient ID: Dale [name], male   DOB: 04-Jan-1946, 66 y.o.   MRN: [id number]    Subjective:  Doing better than he thought Chest tube hurts  Objective:  Filed Vitals:   04/06/13 0400 04/06/13 0500 04/06/13 0600 04/06/13 0726  BP: 107/56  103/64   Pulse: 59  67   Temp: 97.5 F (36.4 C)   98 F (36.7 C)  TempSrc: Oral   Oral  Resp: 17 18 21    Height:      Weight:      SpO2: 98% 95% 93%     Intake/Output from previous day:  Intake/Output Summary (Last 24 hours) at 04/06/13 0824 Last data filed at 04/06/13 0645  Gross per 24 hour  Intake 3985.5 ml  Output   2810 ml  Net 1175.5 ml    Physical Exam: Obese white male Rhonchi and mild wheezing bilaterally S2 prominent from tissue AVR SEM Chest Tube RUL Trace edema Neuro non focal Skin warm and dry  Lab Results: Basic Metabolic Panel:  Recent Labs  27/25/36 1039 04/05/13 1043 04/06/13 0415  NA 141 142 136  K 3.8 3.6 3.1*  CL  --   --  100  CO2  --   --  29  GLUCOSE 150*  --  122*  BUN  --   --  14  CREATININE  --   --  0.64  CALCIUM  --   --  8.5   CBC:  Recent Labs  04/05/13 1043 04/06/13 0415  WBC  --  12.1*  HGB 12.9* 12.2*  HCT 38.0* 35.6*  MCV  --  89.7  PLT  --  224    Imaging: Dg Chest Port 1 View  04/06/2013   CLINICAL DATA:  Status post VATS with right middle and right upper lobe lung biopsy.  EXAM: PORTABLE CHEST - 1 VIEW  COMPARISON:  Chest x-ray 04/05/2013.  FINDINGS: Patient has been extubated. Right-sided chest tube remains in position tip in the apex of the right hemithorax There is a right-sided internal jugular central venous catheter with tip terminating in the mid superior vena. Lung volumes are slightly low. Small right-sided pneumothorax (less than 5% the volume right hemithorax) noted. No significant pleural fluid collections. Diffuse interstitial prominence again noted throughout the lungs bilaterally, similar to yesterday's examination, likely to reflect component mild interstitial  pulmonary edema superimposed upon a background of underlying chronic interstitial lung disease No definite acute consolidative airspace disease heart size is moderately enlarged. Mediastinal contours remain grossly distorted by patient positioning. Small amount of subcutaneous emphysema the right chest wall  IMPRESSION: 1. Support apparatus, as above. 2. Interval development of small right-sided pleural effusion (less than 5% right hemithorax). 3. Improved aeration at the left base, likely to reflect resolved atelectasis. The radiographic appearance the chest is otherwise essentially unchanged, as above. These results were called by telephone at the time of interpretation on 04/06/2013 at 8:17 AM to nurse Lelon Mast (for Dr. Kerin Perna), who verbally acknowledged these results.   Electronically Signed   By: Trudie Reed M.D.   On: 04/06/2013 08:19   Dg Chest Portable 1 View  04/05/2013   CLINICAL DATA:  Postop right VATS  EXAM: PORTABLE CHEST - 1 VIEW  COMPARISON:  04/01/2013.  FINDINGS: Low lung volumes. An endotracheal tube is identified with tip approximately 1.5 cm above the carina. A right-sided internal jugular catheter is appreciated with tip projecting spur the course superior vena cava. A right-sided chest tube identified with  tip projecting in the right lung apex.  There is no radiographic evidence of appreciable pneumothorax. There is diffuse thickening of interstitial markings, peribronchial cuffing, indistinctness of the pulmonary vasculature. An area of increased density projects within the right lung base. The cardiac silhouette is enlarged. Subcutaneous emphysema is identified within the right axilla consistent with patient's history. Visualized osseous structures unremarkable. Patient is status post median sternotomy.  IMPRESSION: 1. Interstitial findings described above a component which secondary to the patient's chronic interstitial lung disease. A superimposed component of pulmonary  edema is also a diagnostic consideration. These findings are accentuated by the patient's low lung volumes. 2. Endotracheal tube 1.5 cm above the carina retraction recommended if clinically warranted 3. Chest tube right lung apex 4. Right internal jugular catheter 5. Atelectasis versus infiltrate left lung base   Electronically Signed   By: Salome Holmes M.D.   On: 04/05/2013 10:27    Cardiac Studies:  ECG:  SR rate 75 first degree AV block   Telemetry:  NSR no arrhythmia  Echo:  11/14  EF 40% AVR with low gradients and no AR  Medications:   . acetaminophen  1,000 mg Oral Q6H   Or  . acetaminophen (TYLENOL) oral liquid 160 mg/5 mL  1,000 mg Oral Q6H  . aspirin EC  81 mg Oral Daily  . atorvastatin  80 mg Oral Daily  . bisacodyl  10 mg Oral Daily  . fenofibrate  54 mg Oral Daily  . fentaNYL   Intravenous Q4H  . hydrochlorothiazide  12.5 mg Oral Daily  . lisinopril  20 mg Oral Daily  . multivitamin with minerals  1 tablet Oral Daily  . niacin  500 mg Oral QHS  . potassium chloride  10 mEq Intravenous Q1 Hr x 3  . Warfarin - Pharmacist Dosing Inpatient   Does not apply q1800     . dextrose 5 % and 0.9% NaCl 75 mL/hr at 04/06/13 0500  . 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  Change HCTZ to daily lasix Pulm:  ? Was he on high dose steroids before admission.  Chest tube out per CVTS.  Biopsy results pending ? Have our pulmonary team follow in house   [name] 04/06/2013, 8:24 AM

## 2013-04-06 NOTE — Plan of Care (Signed)
Problem: Phase II Progression Outcomes Goal: Discharge plan established Outcome: Completed/Met Date Met:  04/06/13 Home with spouse.

## 2013-04-06 NOTE — Progress Notes (Signed)
ANTICOAGULATION CONSULT NOTE - Follow Up Consult  Pharmacy Consult for Heparin Indication: St. Jude AVR - 1996  Allergies  Allergen Reactions  . Penicillins Swelling and Rash    Patient Measurements: Height: 5\' 7"  (170.2 cm) Weight: 214 lb 6 oz (97.24 kg) IBW/kg (Calculated) : 66.1 Heparin Dosing Weight: 87 kg  Vital Signs: Temp: 97.6 F (36.4 C) (11/26 1137) Temp src: Oral (11/26 1137) BP: 114/67 mmHg (11/26 1500) Pulse Rate: 79 (11/26 1500)  Labs:  Recent Labs  04/05/13 1039 04/05/13 1043 04/05/13 2000 04/06/13 0415 04/06/13 1600  HGB 13.3 12.9*  --  12.2*  --   HCT 39.0 38.0*  --  35.6*  --   PLT  --   --   --  224  --   LABPROT  --   --   --  13.2  --   INR  --   --   --  1.02  --   HEPARINUNFRC  --   --  <0.10* <0.10* <0.10*  CREATININE  --   --   --  0.64  --     Estimated Creatinine Clearance: 102.2 ml/min (by C-G formula based on Cr of 0.64).  Assessment: POD # 1 VATS and lung biopsy. Heparin level still low after increase to 1300 units / hr    Goal of Therapy:  Heparin level 0.3-0.7 units/ml, but will target 0.3-0.5 post-op INR ~2.0 per request Monitor platelets by anticoagulation protocol: Yes   Plan:  Increase heparin to 1500 units / hr Follow up AM labs  Thank you. Okey Regal, PharmD [phone]   04/06/2013,7:39 PM

## 2013-04-06 NOTE — Progress Notes (Signed)
ANTICOAGULATION CONSULT NOTE - Follow Up Consult  Pharmacy Consult for Heparin and Coumadin Indication: St. Jude AVR - 1996  Allergies  Allergen Reactions  . Penicillins Swelling and Rash    Patient Measurements: Height: 5\' 7"  (170.2 cm) Weight: 214 lb 6 oz (97.24 kg) IBW/kg (Calculated) : 66.1 Heparin Dosing Weight: 87 kg  Vital Signs: Temp: 98 F (36.7 C) (11/26 0726) Temp src: Oral (11/26 0726) BP: 106/71 mmHg (11/26 1000) Pulse Rate: 71 (11/26 1000)  Labs:  Recent Labs  04/05/13 1039 04/05/13 1043 04/05/13 2000 04/06/13 0415  HGB 13.3 12.9*  --  12.2*  HCT 39.0 38.0*  --  35.6*  PLT  --   --   --  224  LABPROT  --   --   --  13.2  INR  --   --   --  1.02  HEPARINUNFRC  --   --  <0.10* <0.10*  CREATININE  --   --   --  0.64    Estimated Creatinine Clearance: 102.2 ml/min (by C-G formula based on Cr of 0.64).  Assessment:   POD # 1 VATS and lung biopsy.   Heparin begun ~2 hrs post-op on 11/25 at 1000 units/hr. Heparin level < 0.1 x 2.  Now to be adjusted to therapeutic range today. Coumadin resumed with 7.5 mg on 11/25 pm. INR at baseline (1.02).  Low INR target of 2.0 requested for now. Usual INR goal ~2.5.  Home Coumadin regimen: 5 mg daily, except 10 mg on Mondays and Thursdays. CBC stable. One chest tube in.        Cardiology note mentions steroid prior to admission.  Patient and wife report recent steroid taper, but completed ~ 3 weeks ago.  Dr. Marchelle Gearing prescribed Albuterol inhaler prn, when cough returned after course of Prednisone. Has prn Albuterol ordered.  Goal of Therapy:  Heparin level 0.3-0.7 units/ml, but will target 0.3-0.5 post-op INR ~2.0 per request Monitor platelets by anticoagulation protocol: Yes   Plan:    Increase heparin drip from 1000 to 1300 units/hr.   Heparin level ~ 6hrs after increase.   Repeat Coumadin 7.5 mg today.   Daily heparin level, CBC and PT/INR.   Will follow up for chest tube removal.  Dennie Fetters,  RPh Pager: [phone] 04/06/2013,11:02 AM

## 2013-04-07 ENCOUNTER — Inpatient Hospital Stay (HOSPITAL_COMMUNITY): Payer: Medicare Other

## 2013-04-07 LAB — CBC
HCT: 35.6 % — ABNORMAL LOW (ref 39.0–52.0)
Hemoglobin: 12 g/dL — ABNORMAL LOW (ref 13.0–17.0)
MCH: 30.5 pg (ref 26.0–34.0)
MCHC: 33.7 g/dL (ref 30.0–36.0)
MCV: 90.4 fL (ref 78.0–100.0)
Platelets: 209 10*3/uL (ref 150–400)
RBC: 3.94 MIL/uL — ABNORMAL LOW (ref 4.22–5.81)
RDW: 13.8 % (ref 11.5–15.5)
WBC: 12.7 10*3/uL — ABNORMAL HIGH (ref 4.0–10.5)

## 2013-04-07 LAB — HEPARIN LEVEL (UNFRACTIONATED)
Heparin Unfractionated: 0.15 IU/mL — ABNORMAL LOW (ref 0.30–0.70)
Heparin Unfractionated: 0.34 IU/mL (ref 0.30–0.70)

## 2013-04-07 LAB — COMPREHENSIVE METABOLIC PANEL
AST: 32 U/L (ref 0–37)
Albumin: 3.4 g/dL — ABNORMAL LOW (ref 3.5–5.2)
Alkaline Phosphatase: 41 U/L (ref 39–117)
Calcium: 8.5 mg/dL (ref 8.4–10.5)
Chloride: 102 mEq/L (ref 96–112)
Creatinine, Ser: 0.69 mg/dL (ref 0.50–1.35)
GFR calc Af Amer: >90 mL/min (ref >90)
Glucose, Bld: 107 mg/dL — ABNORMAL HIGH (ref 70–99)
Potassium: 3.2 mEq/L — ABNORMAL LOW (ref 3.5–5.1)
Total Bilirubin: 0.6 mg/dL (ref 0.3–1.2)
Total Protein: 6.1 g/dL (ref 6.0–8.3)

## 2013-04-07 LAB — PROTIME-INR
INR: 1.19 (ref 0.00–1.49)
Prothrombin Time: 14.8 seconds (ref 11.6–15.2)

## 2013-04-07 LAB — PRO B NATRIURETIC PEPTIDE: Pro B Natriuretic peptide (BNP): 473.2 pg/mL — ABNORMAL HIGH (ref 0–125)

## 2013-04-07 MED ORDER — DM-GUAIFENESIN ER 30-600 MG PO TB12
1.0000 | ORAL_TABLET | Freq: Two times a day (BID) | ORAL | Status: DC | PRN
  Administered 2013-04-08: 1 via ORAL
  Filled 2013-04-07 (×2): qty 1

## 2013-04-07 MED ORDER — WARFARIN SODIUM 10 MG PO TABS
10.0000 mg | ORAL_TABLET | Freq: Once | ORAL | Status: AC
  Administered 2013-04-07: 10 mg via ORAL
  Filled 2013-04-07 (×2): qty 1

## 2013-04-07 MED ORDER — POTASSIUM CHLORIDE CRYS ER 20 MEQ PO TBCR
40.0000 meq | EXTENDED_RELEASE_TABLET | Freq: Every day | ORAL | Status: DC
  Administered 2013-04-07 – 2013-04-09 (×3): 40 meq via ORAL
  Filled 2013-04-07 (×3): qty 2

## 2013-04-07 MED ORDER — FUROSEMIDE 40 MG PO TABS
40.0000 mg | ORAL_TABLET | Freq: Every day | ORAL | Status: DC
  Administered 2013-04-08 – 2013-04-09 (×2): 40 mg via ORAL
  Filled 2013-04-07 (×2): qty 1

## 2013-04-07 NOTE — Progress Notes (Addendum)
Pt CT removed per MD order, pt tol well, pt educated to call nursing staff if any change from current baseline, all questions answered

## 2013-04-07 NOTE — Progress Notes (Signed)
ANTICOAGULATION CONSULT NOTE - Follow Up Consult  Pharmacy Consult for heparin Indication: AVR  Labs:  Recent Labs  04/05/13 1043  04/06/13 0415 04/06/13 1600 04/07/13 0440  HGB 12.9*  --  12.2*  --  12.0*  HCT 38.0*  --  35.6*  --  35.6*  PLT  --   --  224  --  209  LABPROT  --   --  13.2  --  14.8  INR  --   --  1.02  --  1.19  HEPARINUNFRC  --   < > <0.10* <0.10* 0.15*  CREATININE  --   --  0.64  --  0.69  < > = values in this interval not displayed.   Assessment: 67yo male remains subtherapeutic on heparin after rate increase, level now increasing.  Goal of Therapy:  Heparin level 0.3-0.7 units/ml, but will target 0.3-0.5 post-op  Plan:  Will increase gtt by 3 units/kg/hr to 1800 units/hr and check level in 6hr.  Vernard Gambles, PharmD, BCPS  04/07/2013,6:43 AM

## 2013-04-07 NOTE — Progress Notes (Addendum)
ANTICOAGULATION CONSULT NOTE - Follow Up Consult  Pharmacy Consult for Heparin and Coumadin Indication: St. Jude AVR - 1996  Allergies  Allergen Reactions  . Penicillins Swelling and Rash    Patient Measurements: Height: 5\' 7"  (170.2 cm) Weight: 213 lb 3 oz (96.7 kg) IBW/kg (Calculated) : 66.1 Heparin Dosing Weight: 87 kg  Vital Signs: Temp: 98.5 F (36.9 C) (11/27 1200) Temp src: Oral (11/27 1200) BP: 112/60 mmHg (11/27 0727) Pulse Rate: 78 (11/27 0800)  Labs:  Recent Labs  04/05/13 1043  04/06/13 0415 04/06/13 1600 04/07/13 0440 04/07/13 1443  HGB 12.9*  --  12.2*  --  12.0*  --   HCT 38.0*  --  35.6*  --  35.6*  --   PLT  --   --  224  --  209  --   LABPROT  --   --  13.2  --  14.8  --   INR  --   --  1.02  --  1.19  --   HEPARINUNFRC  --   < > <0.10* <0.10* 0.15* 0.34  CREATININE  --   --  0.64  --  0.69  --   < > = values in this interval not displayed.  Estimated Creatinine Clearance: 102 ml/min (by C-G formula based on Cr of 0.69).  Assessment:   POD # 2 VATS and lung biopsy.   Heparin begun ~2 hrs post-op on 11/25 at 1000 units/hr. Heparin level < 0.1 x 2.  Now to be adjusted to therapeutic range today. Coumadin resumed with 7.5 mg on 11/25 pm. INR at baseline (1.02).  Low INR target of 2.0 requested for now. Usual INR goal ~2.5.  Home Coumadin regimen: 5 mg daily, except 10 mg on Mondays and Thursdays. CBC stable. One chest tube in.  Coumadin dose entered by MD 10mg  tonight.  Agree with dose entered.         Cardiology note mentions steroid prior to admission.  Patient and wife report recent steroid taper, but completed ~ 3 weeks ago.  Dr. Marchelle Gearing prescribed Albuterol inhaler prn, when cough returned after course of Prednisone. Has prn Albuterol ordered.  Goal of Therapy:  Heparin level 0.3-0.7 units/ml, but will target 0.3-0.5 post-op INR ~2.0 per request Monitor platelets by anticoagulation protocol: Yes   Plan:  Continue Heparin 1800  units/hr. Coumadin 10 mg today entered by MD Daily heparin level, CBC and PT/INR. Will follow up for chest tube removal.  Thank you, Piedad Climes, PharmD Clinical Pharmacist - Resident Pager: [phone] Pharmacy: [phone] 04/07/2013 3:33 PM

## 2013-04-07 NOTE — Progress Notes (Signed)
Patient ID: Dale [name], male   DOB: 1945/08/24, 67 y.o.   MRN: [id number]    Subjective:  Left hand hurts Cannot bend fingers  Objective:  Filed Vitals:   04/06/13 2000 04/06/13 2338 04/07/13 0431 04/07/13 0800  BP: 113/63 134/75 120/60   Pulse: 83 74 68   Temp: 97.8 F (36.6 C) 98.5 F (36.9 C) 97.7 F (36.5 C) 97.9 F (36.6 C)  TempSrc: Oral Oral Oral Oral  Resp: 20 18 19    Height:      Weight:   213 lb 3 oz (96.7 kg)   SpO2: 97% 96% 98%     Intake/Output from previous day:  Intake/Output Summary (Last 24 hours) at 04/07/13 0842 Last data filed at 04/07/13 4098  Gross per 24 hour  Intake   1222 ml  Output   1975 ml  Net   -753 ml    Physical Exam: Obese white male Rhonchi and mild wheezing bilaterally S2 prominent from tissue AVR SEM Chest Tube RUL Trace edema Neuro non focal Skin warm and dry Good radial pulse in left hand unable to flex fingers which are swollen  Lab Results: Basic Metabolic Panel:  Recent Labs  11/91/47 0415 04/07/13 0440  NA 136 140  K 3.1* 3.2*  CL 100 102  CO2 29 27  GLUCOSE 122* 107*  BUN 14 12  CREATININE 0.64 0.69  CALCIUM 8.5 8.5   CBC:  Recent Labs  04/06/13 0415 04/07/13 0440  WBC 12.1* 12.7*  HGB 12.2* 12.0*  HCT 35.6* 35.6*  MCV 89.7 90.4  PLT 224 209    Imaging: Dg Chest Port 1 View  04/07/2013   CLINICAL DATA:  Follow-up chest tube.  Postop VATS.  EXAM: PORTABLE CHEST - 1 VIEW  COMPARISON:  04/06/2013 and 04/05/2013.  FINDINGS: 0636 hr. Right IJ central venous catheter and right chest tube are unchanged in position. There is a minimal residual right apical pneumothorax laterally. There is stable right chest wall soft tissue emphysema. Underlying pulmonary opacities are unchanged. There is stable cardiomegaly status post median sternotomy.  IMPRESSION: Stable postoperative chest. Stable minimal right apical pneumothorax.   Electronically Signed   By: Roxy Horseman M.D.   On: 04/07/2013 08:33   Dg Chest Port  1 View  04/06/2013   CLINICAL DATA:  Status post VATS with right middle and right upper lobe lung biopsy.  EXAM: PORTABLE CHEST - 1 VIEW  COMPARISON:  Chest x-ray 04/05/2013.  FINDINGS: Patient has been extubated. Right-sided chest tube remains in position tip in the apex of the right hemithorax There is a right-sided internal jugular central venous catheter with tip terminating in the mid superior vena. Lung volumes are slightly low. Small right-sided pneumothorax (less than 5% the volume right hemithorax) noted. No significant pleural fluid collections. Diffuse interstitial prominence again noted throughout the lungs bilaterally, similar to yesterday's examination, likely to reflect component mild interstitial pulmonary edema superimposed upon a background of underlying chronic interstitial lung disease No definite acute consolidative airspace disease heart size is moderately enlarged. Mediastinal contours remain grossly distorted by patient positioning. Small amount of subcutaneous emphysema the right chest wall  IMPRESSION: 1. Support apparatus, as above. 2. Interval development of small right-sided pleural effusion (less than 5% right hemithorax). 3. Improved aeration at the left base, likely to reflect resolved atelectasis. The radiographic appearance the chest is otherwise essentially unchanged, as above. These results were called by telephone at the time of interpretation on 04/06/2013 at 8:17 AM to nurse  Samantha (for Dr. Kerin Perna), who verbally acknowledged these results.   Electronically Signed   By: Trudie Reed M.D.   On: 04/06/2013 08:19   Dg Chest Portable 1 View  04/05/2013   CLINICAL DATA:  Postop right VATS  EXAM: PORTABLE CHEST - 1 VIEW  COMPARISON:  04/01/2013.  FINDINGS: Low lung volumes. An endotracheal tube is identified with tip approximately 1.5 cm above the carina. A right-sided internal jugular catheter is appreciated with tip projecting spur the course superior vena cava.  A right-sided chest tube identified with tip projecting in the right lung apex.  There is no radiographic evidence of appreciable pneumothorax. There is diffuse thickening of interstitial markings, peribronchial cuffing, indistinctness of the pulmonary vasculature. An area of increased density projects within the right lung base. The cardiac silhouette is enlarged. Subcutaneous emphysema is identified within the right axilla consistent with patient's history. Visualized osseous structures unremarkable. Patient is status post median sternotomy.  IMPRESSION: 1. Interstitial findings described above a component which secondary to the patient's chronic interstitial lung disease. A superimposed component of pulmonary edema is also a diagnostic consideration. These findings are accentuated by the patient's low lung volumes. 2. Endotracheal tube 1.5 cm above the carina retraction recommended if clinically warranted 3. Chest tube right lung apex 4. Right internal jugular catheter 5. Atelectasis versus infiltrate left lung base   Electronically Signed   By: Salome Holmes M.D.   On: 04/05/2013 10:27    Cardiac Studies:  ECG:  SR rate 75 first degree AV block   Telemetry:  NSR no arrhythmia  Echo:  11/14  EF 40% AVR with low gradients and no AR  Medications:   . aspirin EC  81 mg Oral Daily  . atorvastatin  80 mg Oral Daily  . bisacodyl  10 mg Oral Daily  . fenofibrate  54 mg Oral Daily  . fentaNYL   Intravenous Q4H  . furosemide  40 mg Intravenous Daily  . lisinopril  20 mg Oral Daily  . multivitamin with minerals  1 tablet Oral Daily  . niacin  500 mg Oral QHS  . Warfarin - Pharmacist Dosing Inpatient   Does not apply q1800     . dextrose 5 % and 0.9% NaCl 20 mL/hr at 04/06/13 1100  . 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  On daily lasix I/O's negative  Supple K Pulm:   ? Was he on high dose steroids before admission.  Chest tube out per CVTS.  Biopsy results pending ? Have our pulmonary team follow in house Hand:  D/ C iv on top of hand ICE  Had tape wrapped around wrist too tight??  Doubt gout  Plan per surgeons   [name] 04/07/2013, 8:42 AM

## 2013-04-07 NOTE — Progress Notes (Signed)
[facility] DAILY ICU PROGRESS NOTE                   [address]            [place] [address]          [phone]   2 Days Post-Op Procedure(s) (LRB): RIGHT VIDEO ASSISTED THORACOSCOPY,RIGHT MIDDLE LOBE & RIGHT UPPER LOBE LUNG BIOPSY (Right)  Total Length of Stay:  LOS: 2 days   Subjective: Feels ok, some left hand pain from IV site  Objective: Vital signs in last 24 hours: Temp:  [97.6 F (36.4 C)-98.5 F (36.9 C)] 97.9 F (36.6 C) (11/27 0800) Pulse Rate:  [68-91] 68 (11/27 0431) Cardiac Rhythm:  [-] Normal sinus rhythm (11/27 0431) Resp:  [17-23] 19 (11/27 0431) BP: (102-134)/(60-78) 120/60 mmHg (11/27 0431) SpO2:  [91 %-98 %] 98 % (11/27 0431) Weight:  [213 lb 3 oz (96.7 kg)] 213 lb 3 oz (96.7 kg) (11/27 0431)  Filed Weights   04/05/13 1320 04/07/13 0431  Weight: 214 lb 6 oz (97.24 kg) 213 lb 3 oz (96.7 kg)    Weight change: -1 lb 3 oz (-0.54 kg)   Hemodynamic parameters for last 24 hours:    Intake/Output from previous day: 11/26 0701 - 11/27 0700 In: 1686 [P.O.:630; I.V.:906; IV Piggyback:150] Out: 2055 [Urine:1925; Chest Tube:130]  Intake/Output this shift:    Current Meds: Scheduled Meds: . aspirin EC  81 mg Oral Daily  . atorvastatin  80 mg Oral Daily  . bisacodyl  10 mg Oral Daily  . fenofibrate  54 mg Oral Daily  . fentaNYL   Intravenous Q4H  . furosemide  40 mg Intravenous Daily  . lisinopril  20 mg Oral Daily  . multivitamin with minerals  1 tablet Oral Daily  . niacin  500 mg Oral QHS  . potassium chloride  40 mEq Oral Daily  . Warfarin - Pharmacist Dosing Inpatient   Does not apply q1800   Continuous Infusions: . dextrose 5 % and 0.9% NaCl 20 mL/hr at 04/06/13 1100  . heparin 1,800 Units/hr (04/07/13 0723)   PRN Meds:.albuterol, calcium carbonate, diphenhydrAMINE, diphenhydrAMINE, naloxone, ondansetron (ZOFRAN) IV, oxyCODONE-acetaminophen, oxyCODONE-acetaminophen, [START ON 04/08/2013] pneumococcal 23 valent vaccine,  potassium chloride, senna-docusate, sodium chloride, traMADol  General appearance: alert, cooperative and no distress Heart: regular rate and rhythm Lungs: coarse BS Abdomen: soft, nontender Extremities: trace edema Wound: dressings CDI  Lab Results: CBC: Recent Labs  04/06/13 0415 04/07/13 0440  WBC 12.1* 12.7*  HGB 12.2* 12.0*  HCT 35.6* 35.6*  PLT 224 209   BMET:  Recent Labs  04/06/13 0415 04/07/13 0440  NA 136 140  K 3.1* 3.2*  CL 100 102  CO2 29 27  GLUCOSE 122* 107*  BUN 14 12  CREATININE 0.64 0.69  CALCIUM 8.5 8.5    PT/INR:  Recent Labs  04/07/13 0440  LABPROT 14.8  INR 1.19   Radiology: Dg Chest Port 1 View  04/07/2013   CLINICAL DATA:  Follow-up chest tube.  Postop VATS.  EXAM: PORTABLE CHEST - 1 VIEW  COMPARISON:  04/06/2013 and 04/05/2013.  FINDINGS: 0636 hr. Right IJ central venous catheter and right chest tube are unchanged in position. There is a minimal residual right apical pneumothorax laterally. There is stable right chest wall soft tissue emphysema. Underlying pulmonary opacities are unchanged. There is stable cardiomegaly status post median sternotomy.  IMPRESSION: Stable postoperative chest. Stable minimal right apical pneumothorax.   Electronically Signed   By:  Roxy Horseman M.D.   On: 04/07/2013 08:33   Dg Chest Port 1 View  04/06/2013   CLINICAL DATA:  Status post VATS with right middle and right upper lobe lung biopsy.  EXAM: PORTABLE CHEST - 1 VIEW  COMPARISON:  Chest x-ray 04/05/2013.  FINDINGS: Patient has been extubated. Right-sided chest tube remains in position tip in the apex of the right hemithorax There is a right-sided internal jugular central venous catheter with tip terminating in the mid superior vena. Lung volumes are slightly low. Small right-sided pneumothorax (less than 5% the volume right hemithorax) noted. No significant pleural fluid collections. Diffuse interstitial prominence again noted throughout the lungs bilaterally,  similar to yesterday's examination, likely to reflect component mild interstitial pulmonary edema superimposed upon a background of underlying chronic interstitial lung disease No definite acute consolidative airspace disease heart size is moderately enlarged. Mediastinal contours remain grossly distorted by patient positioning. Small amount of subcutaneous emphysema the right chest wall  IMPRESSION: 1. Support apparatus, as above. 2. Interval development of small right-sided pleural effusion (less than 5% right hemithorax). 3. Improved aeration at the left base, likely to reflect resolved atelectasis. The radiographic appearance the chest is otherwise essentially unchanged, as above. These results were called by telephone at the time of interpretation on 04/06/2013 at 8:17 AM to nurse Lelon Mast (for Dr. Kerin Perna), who verbally acknowledged these results.   Electronically Signed   By: Trudie Reed M.D.   On: 04/06/2013 08:19   Dg Chest Portable 1 View  04/05/2013   CLINICAL DATA:  Postop right VATS  EXAM: PORTABLE CHEST - 1 VIEW  COMPARISON:  04/01/2013.  FINDINGS: Low lung volumes. An endotracheal tube is identified with tip approximately 1.5 cm above the carina. A right-sided internal jugular catheter is appreciated with tip projecting spur the course superior vena cava. A right-sided chest tube identified with tip projecting in the right lung apex.  There is no radiographic evidence of appreciable pneumothorax. There is diffuse thickening of interstitial markings, peribronchial cuffing, indistinctness of the pulmonary vasculature. An area of increased density projects within the right lung base. The cardiac silhouette is enlarged. Subcutaneous emphysema is identified within the right axilla consistent with patient's history. Visualized osseous structures unremarkable. Patient is status post median sternotomy.  IMPRESSION: 1. Interstitial findings described above a component which secondary to the  patient's chronic interstitial lung disease. A superimposed component of pulmonary edema is also a diagnostic consideration. These findings are accentuated by the patient's low lung volumes. 2. Endotracheal tube 1.5 cm above the carina retraction recommended if clinically warranted 3. Chest tube right lung apex 4. Right internal jugular catheter 5. Atelectasis versus infiltrate left lung base   Electronically Signed   By: Salome Holmes M.D.   On: 04/05/2013 10:27     Assessment/Plan: S/P Procedure(s) (LRB): RIGHT VIDEO ASSISTED THORACOSCOPY,RIGHT MIDDLE LOBE & RIGHT UPPER LOBE LUNG BIOPSY (Right)  1 no air leak on H2O seal, moderate tidaling , tiny apical pntx- prob d/c chest tube today 2 path pending 3 ice prn for phlebitis left hand- no evidence of cellulitis currently 4 BNP elevated - cont lasix, replace K+ per protocol and getting 40 meq daily 5 pharmacy  is dosing coumadin  Farzana Koci E 04/07/2013 9:17 AM

## 2013-04-08 ENCOUNTER — Inpatient Hospital Stay (HOSPITAL_COMMUNITY): Payer: Medicare Other

## 2013-04-08 LAB — CBC
HCT: 36.2 % — ABNORMAL LOW (ref 39.0–52.0)
Hemoglobin: 12.4 g/dL — ABNORMAL LOW (ref 13.0–17.0)
MCH: 30.5 pg (ref 26.0–34.0)
MCHC: 34.3 g/dL (ref 30.0–36.0)
MCV: 89.2 fL (ref 78.0–100.0)
Platelets: 225 10*3/uL (ref 150–400)
RBC: 4.06 MIL/uL — ABNORMAL LOW (ref 4.22–5.81)
RDW: 13.7 % (ref 11.5–15.5)
WBC: 11.1 10*3/uL — ABNORMAL HIGH (ref 4.0–10.5)

## 2013-04-08 LAB — PROTIME-INR
INR: 1.27 (ref 0.00–1.49)
Prothrombin Time: 15.6 s — ABNORMAL HIGH (ref 11.6–15.2)

## 2013-04-08 LAB — BASIC METABOLIC PANEL
BUN: 13 mg/dL (ref 6–23)
CO2: 26 mEq/L (ref 19–32)
Calcium: 9.1 mg/dL (ref 8.4–10.5)
Chloride: 102 mEq/L (ref 96–112)
Creatinine, Ser: 0.74 mg/dL (ref 0.50–1.35)
GFR calc Af Amer: >90 mL/min (ref >90)
GFR calc non Af Amer: >90 mL/min (ref >90)
Glucose, Bld: 97 mg/dL (ref 70–99)
Potassium: 3.5 mEq/L (ref 3.5–5.1)
Sodium: 138 mEq/L (ref 135–145)

## 2013-04-08 LAB — HEPARIN LEVEL (UNFRACTIONATED): Heparin Unfractionated: 0.35 IU/mL (ref 0.30–0.70)

## 2013-04-08 MED ORDER — VANCOMYCIN HCL IN DEXTROSE 1-5 GM/200ML-% IV SOLN
1000.0000 mg | Freq: Three times a day (TID) | INTRAVENOUS | Status: DC
  Administered 2013-04-08 – 2013-04-09 (×4): 1000 mg via INTRAVENOUS
  Filled 2013-04-08 (×6): qty 200

## 2013-04-08 MED ORDER — WARFARIN SODIUM 10 MG PO TABS
10.0000 mg | ORAL_TABLET | Freq: Once | ORAL | Status: AC
  Administered 2013-04-08: 10 mg via ORAL
  Filled 2013-04-08: qty 1

## 2013-04-08 NOTE — Progress Notes (Signed)
Utilization review completed.  

## 2013-04-08 NOTE — Progress Notes (Signed)
ANTICOAGULATION/ANTIBIOTIC CONSULT NOTE - Follow Up Consult  Pharmacy Consult for Heparin and Coumadin; Vancomycin Indication: [employer] Jude AVR (1996); left hand cellulitis  Allergies  Allergen Reactions  . Penicillins Swelling and Rash    Patient Measurements: Height: 5\' 7"  (170.2 cm) Weight: 213 lb 3 oz (96.7 kg) IBW/kg (Calculated) : 66.1 Heparin Dosing Weight: 87kg  Vital Signs: Temp: 97.9 F (36.6 C) (11/28 0735) Temp src: Oral (11/28 0735) BP: 129/63 mmHg (11/28 0735) Pulse Rate: 65 (11/28 0735)  Labs:  Recent Labs  04/06/13 0415  04/07/13 0440 04/07/13 1443 04/08/13 0425  HGB 12.2*  --  12.0*  --  12.4*  HCT 35.6*  --  35.6*  --  36.2*  PLT 224  --  209  --  225  LABPROT 13.2  --  14.8  --  15.6*  INR 1.02  --  1.19  --  1.27  HEPARINUNFRC <0.10*  < > 0.15* 0.34 0.35  CREATININE 0.64  --  0.69  --  0.74  < > = values in this interval not displayed.  Estimated Creatinine Clearance: 102 ml/min (by C-G formula based on Cr of 0.74).   Medications:  Heparin @ 1800 units/hr  Assessment: 67yom POD#3 VATS continues on heparin to coumadin bridge for his AVR. Heparin level is therapeutic. INR remains subtherapeutic but is starting to trend up. CBC stable. No bleeding reported.  Also to begin vancomycin for left hand cellulitis. Renal function is stable with sCr 0.74 and CrCl ~ 136ml/min.    Goal of Therapy:  Heparin level 0.3-0.7 units/ml, but target 0.3-0.5 post-op INR ~2 Monitor platelets by anticoagulation protocol: Yes Vancomycin trough 10-15   Plan:  1) Continue heparin at 1800 units/hr 2) Repeat coumadin 10mg  x 1 3) Vancomycin 1g IV q8 4) Daily heparin level, INR, CBC 5) Follow renal function, cultures, LOT, trough at [facility] 04/08/2013,8:46 AM

## 2013-04-08 NOTE — Progress Notes (Signed)
[facility] DAILY ICU PROGRESS NOTE                   [address]            [place] [address]          [phone]   3 Days Post-Op Procedure(s) (LRB): RIGHT VIDEO ASSISTED THORACOSCOPY,RIGHT MIDDLE LOBE & RIGHT UPPER LOBE LUNG BIOPSY (Right)  Total Length of Stay:  LOS: 3 days   Subjective: Feels ok, except left hand is very painful and more erethematous  Objective: Vital signs in last 24 hours: Temp:  [97.6 F (36.4 C)-98.5 F (36.9 C)] 97.9 F (36.6 C) (11/28 0735) Pulse Rate:  [65-83] 65 (11/28 0735) Cardiac Rhythm:  [-] Heart block (11/28 0348) Resp:  [14-21] 16 (11/28 0735) BP: (111-136)/(59-95) 129/63 mmHg (11/28 0735) SpO2:  [93 %-97 %] 97 % (11/28 0735)  Filed Weights   04/05/13 1320 04/07/13 0431  Weight: 214 lb 6 oz (97.24 kg) 213 lb 3 oz (96.7 kg)    Weight change:    Hemodynamic parameters for last 24 hours:    Intake/Output from previous day: 11/27 0701 - 11/28 0700 In: 2341.9 [P.O.:1560; I.V.:781.9] Out: 300 [Urine:300]  Intake/Output this shift: Total I/O In: 38 [I.V.:38] Out: -   Current Meds: Scheduled Meds: . aspirin EC  81 mg Oral Daily  . atorvastatin  80 mg Oral Daily  . bisacodyl  10 mg Oral Daily  . fenofibrate  54 mg Oral Daily  . furosemide  40 mg Oral Daily  . lisinopril  20 mg Oral Daily  . multivitamin with minerals  1 tablet Oral Daily  . niacin  500 mg Oral QHS  . potassium chloride  40 mEq Oral Daily  . Warfarin - Pharmacist Dosing Inpatient   Does not apply q1800   Continuous Infusions: . dextrose 5 % and 0.9% NaCl 20 mL/hr at 04/07/13 1945  . heparin 1,800 Units/hr (04/08/13 0600)   PRN Meds:.albuterol, calcium carbonate, dextromethorphan-guaiFENesin, ondansetron (ZOFRAN) IV, oxyCODONE-acetaminophen, oxyCODONE-acetaminophen, pneumococcal 23 valent vaccine, potassium chloride, senna-docusate, traMADol  General appearance: alert, cooperative and no distress Heart: regular rate and rhythm Lungs: fair air  exchange , improved from yesterday, decreased in bases Extremities: left hand tender to palp near thumb, + mild erethema, + swelling Wound: incis ok  Lab Results: CBC: Recent Labs  04/07/13 0440 04/08/13 0425  WBC 12.7* 11.1*  HGB 12.0* 12.4*  HCT 35.6* 36.2*  PLT 209 225   BMET:  Recent Labs  04/07/13 0440 04/08/13 0425  NA 140 138  K 3.2* 3.5  CL 102 102  CO2 27 26  GLUCOSE 107* 97  BUN 12 13  CREATININE 0.69 0.74  CALCIUM 8.5 9.1    PT/INR:  Recent Labs  04/08/13 0425  LABPROT 15.6*  INR 1.27   Radiology: Dg Chest 2 View  04/08/2013   CLINICAL DATA:  Lung biopsy.  EXAM: CHEST  2 VIEW  COMPARISON:  04/07/2013.  FINDINGS: Interim removal right chest tube. Tiny unchanged right apical pneumothorax. No progressive pneumothorax. Right IJ line in stable position. Prior CABG. Cardiomegaly with pulmonary vascular congestion. Underlying bilateral pulmonary alveolar infiltrates are unchanged. Right chest wall subcutaneous emphysema again noted.  IMPRESSION: 1. Interim removal of right chest tube. Tiny apical pneumothorax is unchanged. 2. Persistent cardiomegaly and bilateral unchanged pulmonary opacities . A component of congestive heart failure may be present. Prior CABG .   Electronically Signed   By: Maisie Fus  Register   On:  04/08/2013 07:51   Dg Chest Port 1 View  04/07/2013   CLINICAL DATA:  Follow-up chest tube.  Postop VATS.  EXAM: PORTABLE CHEST - 1 VIEW  COMPARISON:  04/06/2013 and 04/05/2013.  FINDINGS: 0636 hr. Right IJ central venous catheter and right chest tube are unchanged in position. There is a minimal residual right apical pneumothorax laterally. There is stable right chest wall soft tissue emphysema. Underlying pulmonary opacities are unchanged. There is stable cardiomegaly status post median sternotomy.  IMPRESSION: Stable postoperative chest. Stable minimal right apical pneumothorax.   Electronically Signed   By: Roxy Horseman M.D.   On: 04/07/2013 08:33      Assessment/Plan: S/P Procedure(s) (LRB): RIGHT VIDEO ASSISTED THORACOSCOPY,RIGHT MIDDLE LOBE & RIGHT UPPER LOBE LUNG BIOPSY (Right)  1 concerned left hand is developing cellulitis and not just phlebitis. Will discuss with PVT     GOLD,WAYNE E 04/08/2013 8:15 AM

## 2013-04-08 NOTE — Consult Note (Signed)
Reason for Consult:LEFT HAND SWELLING Referring Physician: DR. VAN TRIGT  Dale [name] is an 67 y.o. male.  HPI: CHART REVIEWED, NOTES REVIEWED PT UNDERWENT VATS PROCEDURE Tuesday OVER LAST SEVERAL DAYS DEVELOPED WORSENING PAIN AND SWELLING OVER THUMB REGION STARTED ON IV ABX ANS SPLINTED THIS AM NO HISTORY OF THUMB INJURY OR GOUT OR ARTHRITIS OF THUMB THAT HE IS AWARE OF  Past Medical History  Diagnosis Date  . Pure hyperglyceridemia   . S/P aortic valve replacement   . Other left bundle branch block   . Other dyspnea and respiratory abnormality   . Obesity, unspecified   . Unspecified arthropathy, lower leg   . Internal hemorrhoids without mention of complication 02/24/2002    Colonoscopy   . Heart murmur   . Pulmonary fibrosis   . GERD (gastroesophageal reflux disease)   . Hemorrhoids   . Arthritis     Past Surgical History  Procedure Laterality Date  . Circumcision  05/01/2006  . Shoulder arthroscopy  11/14/2004    Left shoulder  . Pacemaker removal  09/03/00    followed by removal of both atreal and ventricular pacing leads in a  patient who had evidence of pacemaker lead crush as well as pocket infection  . Aortic valve replacement    . Right knee    . Joint replacement Right     partial  . Video assisted thoracoscopy Right 04/05/2013    Procedure: RIGHT VIDEO ASSISTED THORACOSCOPY,RIGHT MIDDLE LOBE & RIGHT UPPER LOBE LUNG BIOPSY;  Surgeon: Kerin Perna, MD;  Location: [facility] OR;  Service: Thoracic;  Laterality: Right;    Family History  Problem Relation Age of Onset  . Uterine cancer Mother   . Diabetes Sister     Social History:  reports that he quit smoking about 39 years ago. His smoking use included Cigarettes. He has a 15 pack-year smoking history. He has never used smokeless tobacco. He reports that he drinks alcohol. He reports that he does not use illicit drugs.  Allergies:  Allergies  Allergen Reactions  . Penicillins Swelling and Rash     Medications: I have reviewed the patient's current medications.  Results for orders placed during the hospital encounter of 04/05/13 (from the past 48 hour(s))  HEPARIN LEVEL (UNFRACTIONATED)     Status: Abnormal   Collection Time    04/06/13  4:00 PM      Result Value Range   Heparin Unfractionated <0.10 (*) 0.30 - 0.70 IU/mL   Comment:            IF HEPARIN RESULTS ARE BELOW     EXPECTED VALUES, AND PATIENT     DOSAGE HAS BEEN CONFIRMED,     SUGGEST FOLLOW UP TESTING     OF ANTITHROMBIN III LEVELS.  COMPREHENSIVE METABOLIC PANEL     Status: Abnormal   Collection Time    04/07/13  4:40 AM      Result Value Range   Sodium 140  135 - 145 mEq/L   Potassium 3.2 (*) 3.5 - 5.1 mEq/L   Chloride 102  96 - 112 mEq/L   CO2 27  19 - 32 mEq/L   Glucose, Bld 107 (*) 70 - 99 mg/dL   BUN 12  6 - 23 mg/dL   Creatinine, Ser 1.61  0.50 - 1.35 mg/dL   Calcium 8.5  8.4 - 09.6 mg/dL   Total Protein 6.1  6.0 - 8.3 g/dL   Albumin 3.4 (*) 3.5 - 5.2 g/dL  AST 32  0 - 37 U/L   ALT 24  0 - 53 U/L   Alkaline Phosphatase 41  39 - 117 U/L   Total Bilirubin 0.6  0.3 - 1.2 mg/dL   GFR calc non Af Amer >90  >90 mL/min   GFR calc Af Amer >90  >90 mL/min   Comment: (NOTE)     The eGFR has been calculated using the CKD EPI equation.     This calculation has not been validated in all clinical situations.     eGFR's persistently <90 mL/min signify possible Chronic Kidney     Disease.  PROTIME-INR     Status: None   Collection Time    04/07/13  4:40 AM      Result Value Range   Prothrombin Time 14.8  11.6 - 15.2 seconds   INR 1.19  0.00 - 1.49  HEPARIN LEVEL (UNFRACTIONATED)     Status: Abnormal   Collection Time    04/07/13  4:40 AM      Result Value Range   Heparin Unfractionated 0.15 (*) 0.30 - 0.70 IU/mL   Comment:            IF HEPARIN RESULTS ARE BELOW     EXPECTED VALUES, AND PATIENT     DOSAGE HAS BEEN CONFIRMED,     SUGGEST FOLLOW UP TESTING     OF ANTITHROMBIN III LEVELS.  CBC      Status: Abnormal   Collection Time    04/07/13  4:40 AM      Result Value Range   WBC 12.7 (*) 4.0 - 10.5 K/uL   RBC 3.94 (*) 4.22 - 5.81 MIL/uL   Hemoglobin 12.0 (*) 13.0 - 17.0 g/dL   HCT 16.1 (*) 09.6 - 04.5 %   MCV 90.4  78.0 - 100.0 fL   MCH 30.5  26.0 - 34.0 pg   MCHC 33.7  30.0 - 36.0 g/dL   RDW 40.9  81.1 - 91.4 %   Platelets 209  150 - 400 K/uL  PRO B NATRIURETIC PEPTIDE     Status: Abnormal   Collection Time    04/07/13  4:40 AM      Result Value Range   Pro B Natriuretic peptide (BNP) 473.2 (*) 0 - 125 pg/mL  HEPARIN LEVEL (UNFRACTIONATED)     Status: None   Collection Time    04/07/13  2:43 PM      Result Value Range   Heparin Unfractionated 0.34  0.30 - 0.70 IU/mL   Comment:            IF HEPARIN RESULTS ARE BELOW     EXPECTED VALUES, AND PATIENT     DOSAGE HAS BEEN CONFIRMED,     SUGGEST FOLLOW UP TESTING     OF ANTITHROMBIN III LEVELS.  PROTIME-INR     Status: Abnormal   Collection Time    04/08/13  4:25 AM      Result Value Range   Prothrombin Time 15.6 (*) 11.6 - 15.2 seconds   INR 1.27  0.00 - 1.49  HEPARIN LEVEL (UNFRACTIONATED)     Status: None   Collection Time    04/08/13  4:25 AM      Result Value Range   Heparin Unfractionated 0.35  0.30 - 0.70 IU/mL   Comment:            IF HEPARIN RESULTS ARE BELOW     EXPECTED VALUES, AND PATIENT     DOSAGE HAS  BEEN CONFIRMED,     SUGGEST FOLLOW UP TESTING     OF ANTITHROMBIN III LEVELS.  CBC     Status: Abnormal   Collection Time    04/08/13  4:25 AM      Result Value Range   WBC 11.1 (*) 4.0 - 10.5 K/uL   RBC 4.06 (*) 4.22 - 5.81 MIL/uL   Hemoglobin 12.4 (*) 13.0 - 17.0 g/dL   HCT 16.1 (*) 09.6 - 04.5 %   MCV 89.2  78.0 - 100.0 fL   MCH 30.5  26.0 - 34.0 pg   MCHC 34.3  30.0 - 36.0 g/dL   RDW 40.9  81.1 - 91.4 %   Platelets 225  150 - 400 K/uL  BASIC METABOLIC PANEL     Status: None   Collection Time    04/08/13  4:25 AM      Result Value Range   Sodium 138  135 - 145 mEq/L   Potassium 3.5   3.5 - 5.1 mEq/L   Chloride 102  96 - 112 mEq/L   CO2 26  19 - 32 mEq/L   Glucose, Bld 97  70 - 99 mg/dL   BUN 13  6 - 23 mg/dL   Creatinine, Ser 7.82  0.50 - 1.35 mg/dL   Calcium 9.1  8.4 - 95.6 mg/dL   GFR calc non Af Amer >90  >90 mL/min   GFR calc Af Amer >90  >90 mL/min   Comment: (NOTE)     The eGFR has been calculated using the CKD EPI equation.     This calculation has not been validated in all clinical situations.     eGFR's persistently <90 mL/min signify possible Chronic Kidney     Disease.    Dg Chest 2 View  04/08/2013   CLINICAL DATA:  Lung biopsy.  EXAM: CHEST  2 VIEW  COMPARISON:  04/07/2013.  FINDINGS: Interim removal right chest tube. Tiny unchanged right apical pneumothorax. No progressive pneumothorax. Right IJ line in stable position. Prior CABG. Cardiomegaly with pulmonary vascular congestion. Underlying bilateral pulmonary alveolar infiltrates are unchanged. Right chest wall subcutaneous emphysema again noted.  IMPRESSION: 1. Interim removal of right chest tube. Tiny apical pneumothorax is unchanged. 2. Persistent cardiomegaly and bilateral unchanged pulmonary opacities . A component of congestive heart failure may be present. Prior CABG .   Electronically Signed   By: Maisie Fus  Register   On: 04/08/2013 07:51   Dg Chest Port 1 View  04/07/2013   CLINICAL DATA:  Follow-up chest tube.  Postop VATS.  EXAM: PORTABLE CHEST - 1 VIEW  COMPARISON:  04/06/2013 and 04/05/2013.  FINDINGS: 0636 hr. Right IJ central venous catheter and right chest tube are unchanged in position. There is a minimal residual right apical pneumothorax laterally. There is stable right chest wall soft tissue emphysema. Underlying pulmonary opacities are unchanged. There is stable cardiomegaly status post median sternotomy.  IMPRESSION: Stable postoperative chest. Stable minimal right apical pneumothorax.   Electronically Signed   By: Roxy Horseman M.D.   On: 04/07/2013 08:33    ROSNO H/O GOUT Blood  pressure 116/69, pulse 79, temperature 97.5 F (36.4 C), temperature source Oral, resp. rate 16, height 5\' 7"  (1.702 m), weight 96.7 kg (213 lb 3 oz), SpO2 95.00%. Physical Exam General Appearance:  Alert, cooperative, no distress, appears stated age  Head:  Normocephalic, without obvious abnormality, atraumatic  Eyes:  Pupils equal, conjunctiva/corneas clear,         Throat:  Neck:      Lungs:     Chest Wall:    Heart:    Abdomen:            Extremities: LEFT HAND: MILD SWELLING AND REDNESS OVER THUMB BASILAR JOINT REGION, NO DEFINITIVE ABSCESS. LIMITED THUMB AND WRIST MOTION GOOD MOBILITY OF FINGERS NO ASCENDING ERYTHEMA OR LYMPHANGITIS GOOD RADIAL PULSE HEALED STICK WOUNDS FROM RADIAL ARTERY AND IV ON DORSUM OF HAND. ABLE TO FLEX THUMB IP JOINT ABLE TO EXTEND THUMB  Pulses:   Skin:      Neurologic: Normal   Assessment/Plan: LEFT HAND CELLULITS VERUS FLARE UP OF ARTHRITIS OF THUMB LOOKS SIMILAR TO GOUT PRESENTATION  AGREE TO CONTINUE WITH IV ANTIBIOTICS FOR TREATMENT OF CELLULITIS CONTINUE WITH IMMOBILIZATION AND SPLINT WAS REAPPLIED ICE./ELEVATE NO USE OF LEFT THUMB AND WRIST  CONTINUE WITH INPATIENT CARE WILL REASSESS TOMORROW IF LOOKS BETTER COULD GO HOME ON ORAL ABX AND SPLINT NO INDICATION OF DEEP SPACE ABSCESS OR INFECTION, NO SURGERY NEEDED TODAY    Sharma Covert 04/08/2013, 12:14 PM

## 2013-04-08 NOTE — Progress Notes (Signed)
Orthopedic Tech Progress Note Patient Details:  Dale [name] 09/30/1945 [id number] Volar splint applied to Left UE for immobilization of wrist/hand for cellulitis.  Ortho Devices Type of Ortho Device: Ace wrap;Volar splint Ortho Device/Splint Location: Left UE Ortho Device/Splint Interventions: Application   Asia R Thompson 04/08/2013, 12:14 PM

## 2013-04-09 LAB — CBC
HCT: 35.2 % — ABNORMAL LOW (ref 39.0–52.0)
Hemoglobin: 12 g/dL — ABNORMAL LOW (ref 13.0–17.0)
MCH: 30.5 pg (ref 26.0–34.0)
MCHC: 34.1 g/dL (ref 30.0–36.0)
MCV: 89.3 fL (ref 78.0–100.0)
Platelets: 230 10*3/uL (ref 150–400)
RBC: 3.94 MIL/uL — ABNORMAL LOW (ref 4.22–5.81)
RDW: 13.6 % (ref 11.5–15.5)
WBC: 9 10*3/uL (ref 4.0–10.5)

## 2013-04-09 LAB — PROTIME-INR
INR: 1.55 — ABNORMAL HIGH (ref 0.00–1.49)
Prothrombin Time: 18.2 seconds — ABNORMAL HIGH (ref 11.6–15.2)

## 2013-04-09 LAB — HEPARIN LEVEL (UNFRACTIONATED): Heparin Unfractionated: 0.35 IU/mL (ref 0.30–0.70)

## 2013-04-09 MED ORDER — WARFARIN SODIUM 5 MG PO TABS: 7.5000 mg | ORAL_TABLET | Freq: Every day | ORAL | Status: DC

## 2013-04-09 MED ORDER — CEPHALEXIN 500 MG PO CAPS
500.0000 mg | ORAL_CAPSULE | Freq: Four times a day (QID) | ORAL | Status: DC
  Administered 2013-04-09: 500 mg via ORAL
  Filled 2013-04-09 (×4): qty 1

## 2013-04-09 MED ORDER — LUNG SURGERY BOOK
Freq: Once | Status: AC
  Administered 2013-04-09: 11:00:00
  Filled 2013-04-09: qty 1

## 2013-04-09 MED ORDER — OXYCODONE-ACETAMINOPHEN 5-325 MG PO TABS: 1.0000 | ORAL_TABLET | ORAL | Status: DC | PRN

## 2013-04-09 MED ORDER — WARFARIN SODIUM 7.5 MG PO TABS
7.5000 mg | ORAL_TABLET | Freq: Once | ORAL | Status: AC
  Administered 2013-04-09: 7.5 mg via ORAL
  Filled 2013-04-09: qty 1

## 2013-04-09 MED ORDER — CEPHALEXIN 500 MG PO CAPS: 500.0000 mg | ORAL_CAPSULE | Freq: Four times a day (QID) | ORAL | Status: DC

## 2013-04-09 MED ORDER — PNEUMOCOCCAL VAC POLYVALENT 25 MCG/0.5ML IJ INJ
0.5000 mL | INJECTION | Freq: Once | INTRAMUSCULAR | Status: AC
  Administered 2013-04-09: 0.5 mL via INTRAMUSCULAR
  Filled 2013-04-09: qty 0.5

## 2013-04-09 NOTE — Progress Notes (Signed)
ANTICOAGULATION/ANTIBIOTIC CONSULT NOTE - Follow Up Consult  Pharmacy Consult for Heparin and Coumadin/Vancomycin Indication: St Jude AVR (1996)/ left hand cellulitis  Allergies  Allergen Reactions  . Penicillins Swelling and Rash    Patient Measurements: Height: 5\' 7"  (170.2 cm) Weight: 213 lb 3 oz (96.7 kg) IBW/kg (Calculated) : 66.1 Heparin Dosing Weight: 87kg  Vital Signs: Temp: 98.1 F (36.7 C) (11/29 0356) Temp src: Oral (11/29 0356) BP: 123/65 mmHg (11/29 0356) Pulse Rate: 72 (11/29 0356)  Labs:  Recent Labs  04/07/13 0440 04/07/13 1443 04/08/13 0425 04/09/13 0451  HGB 12.0*  --  12.4* 12.0*  HCT 35.6*  --  36.2* 35.2*  PLT 209  --  225 230  LABPROT 14.8  --  15.6* 18.2*  INR 1.19  --  1.27 1.55*  HEPARINUNFRC 0.15* 0.34 0.35 0.35  CREATININE 0.69  --  0.74  --     Estimated Creatinine Clearance: 102 ml/min (by C-G formula based on Cr of 0.74).   Medications:  Heparin @ 1800 units/hr  Assessment: 67yom POD#3 VATS continues on heparin to coumadin bridge for his AVR. Heparin level is therapeutic. INR remains subtherapeutic but is trending up. CBC stable. No bleeding reported.  Also on vancomycin for left hand cellulitis. Renal function is stable with sCr 0.74 and CrCl ~ 173ml/min.    Goal of Therapy:  Heparin level 0.3-0.7 units/ml, but target 0.3-0.5 post-op INR ~2 Monitor platelets by anticoagulation protocol: Yes Vancomycin trough 10-15   Plan:  1) Continue heparin at 1800 units/hr 2) Coumadin 7.5mg  x 1 3) Vancomycin 1g IV q8 4) Daily heparin level, INR, CBC 5) Follow renal function, cultures, LOT, hold off on trough as likely discharge today or tomorrow per MD note  Thank you, Piedad Climes, PharmD Clinical Pharmacist - Resident Pager: [phone] Pharmacy: [phone] 04/09/2013 7:40 AM

## 2013-04-09 NOTE — Progress Notes (Signed)
Patient has been discharged home earlier around lunch. Central IV was D/C as ordered. Patient received his Pneumonia vaccine. Reviewed all discharge instructions with patient and his wife, all questions and concerns have been addressed.

## 2013-04-09 NOTE — Progress Notes (Signed)
[facility] DAILY ICU PROGRESS NOTE                   [address]            [place] [address]          [phone]   4 Days Post-Op Procedure(s) (LRB): RIGHT VIDEO ASSISTED THORACOSCOPY,RIGHT MIDDLE LOBE & RIGHT UPPER LOBE LUNG BIOPSY (Right)  Total Length of Stay:  LOS: 4 days   Subjective: Feels ok, hand feels better  Objective: Vital signs in last 24 hours: Temp:  [97.5 F (36.4 C)-98.7 F (37.1 C)] 98.1 F (36.7 C) (11/29 0356) Pulse Rate:  [54-86] 72 (11/29 0356) Cardiac Rhythm:  [-] Heart block (11/29 0356) Resp:  [15-23] 18 (11/29 0356) BP: (101-129)/(52-69) 123/65 mmHg (11/29 0356) SpO2:  [92 %-97 %] 95 % (11/29 0356)  Filed Weights   04/05/13 1320 04/07/13 0431  Weight: 214 lb 6 oz (97.24 kg) 213 lb 3 oz (96.7 kg)    Weight change:    Hemodynamic parameters for last 24 hours:    Intake/Output from previous day: 11/28 0701 - 11/29 0700 In: 2104 [P.O.:840; I.V.:664; IV Piggyback:600] Out: -   Intake/Output this shift:    Current Meds: Scheduled Meds: . aspirin EC  81 mg Oral Daily  . atorvastatin  80 mg Oral Daily  . bisacodyl  10 mg Oral Daily  . fenofibrate  54 mg Oral Daily  . furosemide  40 mg Oral Daily  . lisinopril  20 mg Oral Daily  . multivitamin with minerals  1 tablet Oral Daily  . niacin  500 mg Oral QHS  . potassium chloride  40 mEq Oral Daily  . vancomycin  1,000 mg Intravenous Q8H  . Warfarin - Pharmacist Dosing Inpatient   Does not apply q1800   Continuous Infusions: . dextrose 5 % and 0.9% NaCl 20 mL/hr at 04/07/13 1945  . heparin 1,800 Units/hr (04/09/13 0352)   PRN Meds:.albuterol, calcium carbonate, dextromethorphan-guaiFENesin, ondansetron (ZOFRAN) IV, oxyCODONE-acetaminophen, oxyCODONE-acetaminophen, pneumococcal 23 valent vaccine, potassium chloride, senna-docusate, traMADol  General appearance: alert, cooperative and no distress Heart: regular rate and rhythm Lungs: mostly clear Extremities: Left hand  wrapped in dressing/splint Wound: incisions healing well, moderate echymosis  Lab Results: CBC: Recent Labs  04/08/13 0425 04/09/13 0451  WBC 11.1* 9.0  HGB 12.4* 12.0*  HCT 36.2* 35.2*  PLT 225 230   BMET:  Recent Labs  04/07/13 0440 04/08/13 0425  NA 140 138  K 3.2* 3.5  CL 102 102  CO2 27 26  GLUCOSE 107* 97  BUN 12 13  CREATININE 0.69 0.74  CALCIUM 8.5 9.1    PT/INR:  Recent Labs  04/09/13 0451  LABPROT 18.2*  INR 1.55*   Radiology: Dg Chest 2 View  04/08/2013   CLINICAL DATA:  Lung biopsy.  EXAM: CHEST  2 VIEW  COMPARISON:  04/07/2013.  FINDINGS: Interim removal right chest tube. Tiny unchanged right apical pneumothorax. No progressive pneumothorax. Right IJ line in stable position. Prior CABG. Cardiomegaly with pulmonary vascular congestion. Underlying bilateral pulmonary alveolar infiltrates are unchanged. Right chest wall subcutaneous emphysema again noted.  IMPRESSION: 1. Interim removal of right chest tube. Tiny apical pneumothorax is unchanged. 2. Persistent cardiomegaly and bilateral unchanged pulmonary opacities . A component of congestive heart failure may be present. Prior CABG .   Electronically Signed   By: Maisie Fus  Register   On: 04/08/2013 07:51     Assessment/Plan: S/P Procedure(s) (LRB): RIGHT VIDEO ASSISTED THORACOSCOPY,RIGHT  MIDDLE LOBE & RIGHT UPPER LOBE LUNG BIOPSY (Right)  1 Doing well overall, will see what hand surgeon says regarding cellulitis. Poss d/c later today if all agree.     Michelangelo Rindfleisch E 04/09/2013 7:23 AM

## 2013-04-09 NOTE — Progress Notes (Signed)
PT SEEN/EXAMINED THUMB LOOKS BETTER TODAY RESPLINTED THIS AM OK TO GO HOME FROM HAND STANDPOINT CONTINUE WITH SPLINT UNTIL I SEE HIM BACK IN OFFICE ON Thursday ORAL KEFLEX IS FINE AND I WIILL PUT IN COMPUTER

## 2013-04-11 ENCOUNTER — Telehealth: Payer: Self-pay | Admitting: Internal Medicine

## 2013-04-11 NOTE — Telephone Encounter (Signed)
Needs fu in 2-4 weeks to discuss lung bx results;  Dr. Kalman Shan, M.D., [facility].C.P Pulmonary and Critical Care Medicine Staff Physician [facility] Pager: [phone], If no answer or between  15:00h - 7:00h: call [phone]  04/11/2013 5:56 PM

## 2013-04-13 ENCOUNTER — Other Ambulatory Visit: Payer: Self-pay | Admitting: *Deleted

## 2013-04-13 DIAGNOSIS — J8410 Pulmonary fibrosis, unspecified: Secondary | ICD-10-CM

## 2013-04-14 ENCOUNTER — Encounter (HOSPITAL_COMMUNITY): Payer: Self-pay | Admitting: Surgical

## 2013-04-14 DIAGNOSIS — L03116 Cellulitis of left lower limb: Secondary | ICD-10-CM | POA: Insufficient documentation

## 2013-04-14 DIAGNOSIS — L03114 Cellulitis of left upper limb: Secondary | ICD-10-CM | POA: Insufficient documentation

## 2013-04-14 NOTE — Telephone Encounter (Signed)
Pt returned call.  Requests 1st available appt.  Set for 04/19/13 @ 10: 45 AM w/ MR.  States nothing further needed.  Antionette Fairy

## 2013-04-14 NOTE — Telephone Encounter (Signed)
[email or web address].Jassmin Kemmerer, CMA  

## 2013-04-14 NOTE — Discharge Summary (Signed)
[address]       [place] [address]             [phone]       Dale [name] 21-Sep-1945 67 y.o. [id number]  04/05/2013 04/09/2013  No att. providers found  pulmonary fibrosis  HPI:at time of admission   The patient is a 67 year old obese Caucasian male presents for evaluation of lung biopsy for his tissue lung disease. The patient developed symptoms of cough and shortness of breath approximately 3 months ago. He was initially treated by his primary care physician with a course of antibiotics and a prednisone taper. This significantly improve his cough he has persistent and progressive shortness of breath with exertion. He underwent careful pulmonary evaluation by Dr. Marchelle Gearing. CT scan showed significant abnormalities consistent with UIP or nonspecific interstitial pneumonia. To define the best therapy a lung biopsy has been requested. The patient was recently evaluated by his cardiologist Dr. Elita Boone in who by echo his EF is 45%. The patient had a [employer] aortic valve replacement in 1996 and the prosthesis is functioning well. He is in a sinus rhythm. He takes Coumadin 5 mg daily with 10 mg 2 days a week. His INR usually runs 2.5.  And autoimmune panel performed by Dr. Marchelle Gearing was negative-nonspecific. There is no family history of interstitial lung disease. The patient has not smoked in over 30 years. He was admitted this hospitalization for Vats/Bx for diagnostic purposes Past Medical History   Diagnosis  Date   .  Pure hyperglyceridemia    .  S/P aortic valve replacement    .  Other left bundle branch block    .  Other dyspnea and respiratory abnormality    .  Obesity, unspecified    .  Unspecified arthropathy, lower leg    .  Internal hemorrhoids without mention of complication  02/24/2002     Colonoscopy    Past Surgical History   Procedure  Laterality  Date   .  Circumcision   05/01/2006   .  Shoulder arthroscopy   11/14/2004     Left shoulder     .  Pacemaker removal   09/03/00     followed by removal of both atreal and ventricular pacing leads in a patient who had evidence of pacemaker lead crush as well as pocket infection   .  Aortic valve replacement     .  Right knee      Family History   Problem  Relation  Age of Onset   .  Uterine cancer  Mother    .  Diabetes  Sister     Social History  History   Substance Use Topics   .  Smoking status:  Former Smoker -- 1.50 packs/day for 10 years     Types:  Cigarettes     Quit date:  05/12/1973   .  Smokeless tobacco:  Never Used   .  Alcohol Use:  0.0 oz/week      Comment: 2-3 beers occassionally    Current Outpatient Prescriptions   Medication  Sig  Dispense  Refill   .  albuterol (VENTOLIN HFA) 108 (90 BASE) MCG/ACT inhaler  Inhale 2 puffs into the lungs every 6 (six) hours as needed for wheezing.  1 Inhaler  6   .  aspirin 81 MG tablet  Take 81 mg by mouth daily.     Marland Kitchen  atorvastatin (LIPITOR) 80 MG tablet  Take  1 tablet (80 mg total) by mouth daily.  90 tablet  4   .  celecoxib (CELEBREX) 200 MG capsule  Take 1 capsule (200 mg total) by mouth daily.  90 capsule  3   .  fenofibrate (TRICOR) 145 MG tablet  Take 1 tablet (145 mg total) by mouth daily.  90 tablet  4   .  HYDROcodone-acetaminophen (NORCO) 5-325 MG per tablet  Take 1 tablet by mouth every 6 (six) hours as needed.     .  multivitamin (THERAGRAN) per tablet  Take 1 tablet by mouth daily.     .  niacin (NIASPAN) 500 MG CR tablet  Take 1 tablet (500 mg total) by mouth at bedtime.  90 tablet  4   .  quinapril-hydrochlorothiazide (ACCURETIC) 20-12.5 MG per tablet  Take 1 tablet by mouth 2 (two) times daily.  180 tablet  4   .  warfarin (COUMADIN) 10 MG tablet  Take 1 tablet (10 mg total) by mouth daily.  90 tablet  4    No current facility-administered medications for this visit.    Allergies   Allergen  Reactions   .  Penicillins  Rash       Hospital Course:  The patient was admitted to the hospital and taken  to the operating room on 04/05/2013 and underwent Procedure(s): DATE OF PROCEDURE: 04/05/2013  DATE OF DISCHARGE:  OPERATIVE REPORT  OPERATION: Right VATS (video-assisted thoracoscopic surgery) with open  lung biopsies of right upper lobe and right middle lobe.  SURGEON: Kerin Perna, M.D.  ASSISTANT: Rowe Clack, P.A.-C.  ANESTHESIA: General by Dr. Michelle Piper.  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 The patient tolerated the procedure well and was taken to the PACU in stable condition.  Post  Operative Hospital Co[facility]:  The patient required reintubation in the PACU due too low oxygen saturations. He was stabilized quickly and was re-extubated a couple hours later. He has remained quite stable since that time. All routine lines and monitors were discontinued using standard protocols. Pathology was pending at time of discharge. He did develop some cellulitis in L hand and was treated with IV vancomycin and transitioned to PO keflex. He was seen by Dr Melvyn Novas of orthopedic hand surgery as well. Clinically this was much improved at time of discharge. He was also quite stable from a pulmonary standpoint. He has been started back on coumadin and had a heparin bridge post-operatively. PT/INR to be checked first weds post discharge as per primary care MD who follows coumadin dosing.      No results found for this basename: NA, K, CL, CO2, GLUCOSE, BUN, CRATININE, CALCIUM,  in the last 72 hours No results found for this basename: WBC, HGB, HCT, PLT,  in the last 72 hours No results found for this basename: INR,  in the last 72 hours   Discharge Instructions:  The patient is discharged to home with extensive instructions on wound care and progressive ambulation.  They are instructed not to drive or perform any heavy lifting until returning to see  the physician in his office.  Discharge Diagnosis:  pulmonary fibrosis Cellulitis Left hand Secondary Diagnosis: Patient Active Problem List   Diagnosis Date Noted  . CHF (congestive heart failure) 04/06/2013  . ILD (interstitial lung disease) 03/07/2013  . Obese 01/09/2011  . First degree atrioventricular block 01/09/2011  . ESSENTIAL HYPERTENSION, BENIGN 10/18/2009  .  LEFT BUNDLE BRANCH BLOCK 11/24/2008  . ARTHRITIS, KNEE 11/24/2008  . DYSPNEA ON EXERTION 11/24/2008  . AORTIC VALVE REPLACEMENT, HX OF 11/24/2008   Past Medical History  Diagnosis Date  . Pure hyperglyceridemia   . S/P aortic valve replacement   . Other left bundle branch block   . Other dyspnea and respiratory abnormality   . Obesity, unspecified   . Unspecified arthropathy, lower leg   . Internal hemorrhoids without mention of complication 02/24/2002    Colonoscopy   . Heart murmur   . Pulmonary fibrosis   . GERD (gastroesophageal reflux disease)   . Hemorrhoids   . Arthritis         Medication List    STOP taking these medications       HYDROcodone-acetaminophen 5-325 MG per tablet  Commonly known as:  NORCO/VICODIN      TAKE these medications       albuterol 108 (90 BASE) MCG/ACT inhaler  Commonly known as:  VENTOLIN HFA  Inhale 2 puffs into the lungs every 6 (six) hours as needed for wheezing.     aspirin 81 MG tablet  Take 81 mg by mouth daily.     atorvastatin 80 MG tablet  Commonly known as:  LIPITOR  Take 1 tablet (80 mg total) by mouth daily.     atorvastatin 80 MG tablet  Commonly known as:  LIPITOR  Take 80 mg by mouth daily.     calcium carbonate 500 MG chewable tablet  Commonly known as:  TUMS - dosed in mg elemental calcium  Chew 1 tablet by mouth as needed for indigestion or heartburn.     celecoxib 200 MG capsule  Commonly known as:  CELEBREX  Take 1 capsule (200 mg total) by mouth daily.     cephALEXin 500 MG capsule  Commonly known as:  KEFLEX  Take 1 capsule  (500 mg total) by mouth 4 (four) times daily.     docusate sodium 100 MG capsule  Commonly known as:  COLACE  Take 200 mg by mouth daily.     fenofibrate 145 MG tablet  Commonly known as:  TRICOR  Take 145 mg by mouth daily.     multivitamin per tablet  Take 1 tablet by mouth daily.     niacin 500 MG CR tablet  Commonly known as:  NIASPAN  Take 1 tablet (500 mg total) by mouth at bedtime.     niacin 500 MG CR tablet  Commonly known as:  NIASPAN  Take 500 mg by mouth at bedtime.     oxyCODONE-acetaminophen 5-325 MG per tablet  Commonly known as:  PERCOCET/ROXICET  Take 1-2 tablets by mouth every 4 (four) hours as needed for severe pain.     quinapril-hydrochlorothiazide 20-12.5 MG per tablet  Commonly known as:  ACCURETIC  Take 1 tablet by mouth 2 (two) times daily.     warfarin 5 MG tablet  Commonly known as:  COUMADIN  Take 1.5 tablets (7.5 mg total) by mouth daily. Take 7.5 MG DAILY TILL pt/inr NEXT CHECK       Follow-up Information   Follow up with Sharma Covert, MD.   Specialty:  Orthopedic Surgery   Contact information:   [address] [place] [address] [phone]       Follow up with VAN Dinah Beers, MD. (ONE WEEK WITH THE NURSE FOR CHEST TUBE SUTURE REMOVAL. tWO WEEKS TO SEE DR VAN TRIGT WITH A CHEST XRAY FROM [facility]. THE  OFFICE WILL CONTACT YOU)    Specialty:  Cardiothoracic Surgery   Contact information:   [address] [place] [address] [phone]       Follow up with Isabella Stalling, MD. (HAVE PT/INR BLOOD TEST NEXT WEDS AT THE OFFICE TO ADJUST COUMADIN DOSE )    Specialty:  Internal Medicine   Contact information:   [address] [place] [address] [phone]       Disposition: discharged home  Patient's condition is Good  Gershon Crane, PA-C 04/14/2013  2:28 PM

## 2013-04-18 ENCOUNTER — Ambulatory Visit (INDEPENDENT_AMBULATORY_CARE_PROVIDER_SITE_OTHER): Payer: Self-pay

## 2013-04-18 DIAGNOSIS — Z4802 Encounter for removal of sutures: Secondary | ICD-10-CM

## 2013-04-18 DIAGNOSIS — I2510 Atherosclerotic heart disease of native coronary artery without angina pectoris: Secondary | ICD-10-CM

## 2013-04-18 NOTE — Progress Notes (Signed)
Removed 2 chest tube sutures from right lateral chest at incision sites. Pt tolerated well with no signs of infection.

## 2013-04-19 ENCOUNTER — Ambulatory Visit (INDEPENDENT_AMBULATORY_CARE_PROVIDER_SITE_OTHER): Payer: Medicare Other | Admitting: Internal Medicine

## 2013-04-19 ENCOUNTER — Encounter (INDEPENDENT_AMBULATORY_CARE_PROVIDER_SITE_OTHER): Payer: Self-pay

## 2013-04-19 ENCOUNTER — Encounter: Payer: Self-pay | Admitting: Internal Medicine

## 2013-04-19 VITALS — BP 132/88 | HR 80 | Ht 67.0 in | Wt 216.0 lb

## 2013-04-19 DIAGNOSIS — J84112 Idiopathic pulmonary fibrosis: Secondary | ICD-10-CM

## 2013-04-19 DIAGNOSIS — L905 Scar conditions and fibrosis of skin: Secondary | ICD-10-CM

## 2013-04-19 MED ORDER — OMEPRAZOLE 20 MG PO CPDR: 20.0000 mg | DELAYED_RELEASE_CAPSULE | Freq: Every day | ORAL | Status: DC

## 2013-04-19 MED ORDER — GABAPENTIN 300 MG PO CAPS: ORAL_CAPSULE | ORAL | Status: DC

## 2013-04-19 NOTE — Patient Instructions (Addendum)
#  Post surgical chest pain - try Take gabapentin 300mg  once daily x 3 days, then 300mg  twice daily x 3 days, then 300mg  three times daily to continue. If this makes you too sleepy or drowsy call us and we will cut your medication dosing down  #IPF - see booklet - start Prilosec 20mg  daily OTC  - read aboud drug below; Pirfenidone - I prefer pirfenidone over OFEV due to theoretical bleeding risk with hium on coumadin - at followup will make referral to rehab - buy pulse oximeter  #FOllowup - early Jan 2015 to discuss treatment for IPF  As of 04/19/2013  Pirfenidone is an approved drug in the [place] but approved in [place], [place], [place], and [place] for treatment of IPF  BASICS  - Believed to function as a general inhibitor of fibrosis and inflammation Exact mechanism of action unknown - Functions by preventing worsening of IPF. Does not reverse process - Studied only in IPF (not studied in other causes of UIP like autoimmune or asbestosis) - Studied only in mild to moderate severity of IPF (DLCO > 30% and FVC > 50%) - Marketed by company called  [employer], out of [place], [place]  - 1  [place] study and 2 [place]/European studies called CAPACITY and 1 International trial called ASCEND - Most recent result was ASCEND, results released Feb 2014 with positive results - Currently FDA reviewing whether to approve drug or not. - Currently in [place], drug available in > 80 centers through expanded accesss program (safety study and compassionate use): WE ARE one of the sites  RESULTS  - Proportion of patients with > 10% decline in FVC (meaningful parameter) is around 1/3 less with those taking pirfenidone at 52 weeks. Basically prevents decline in IPF  - This has  translated into potentially reduced mortality  (pooled analysis but not powered for it) and reduced hospitalization   DOSING - Dose is 2403 mg per day in 3 divided doses and gradually escalated to this dose over 15 days - Take with  food  SIDE EFFECTS - Generally well tolerated. In ASCEND the DC RATE was 14% but this is only 4% more than placebo - GI dyspepsia most common but still tolerated . TAKE With FOOD - Skin irritant - So, wear high SPF (>50) sun-screen, wear hat & full length clothes   - 1:100 to 1:1000 chance of angioedema in first 90 days  - rare side effect called agranulocytosis  CONTRAINDICATIONS - Severe Hepatic impairment or ESLD (> 5 times ULN) - CKD with Cr CL < 30 and ESRD - QTc b> (? Relative v absolute) - Pregnancy - Concomitant smoking   DRUG INTERACTIONS - Pirfenidone is metabolized through CYP1A2. So caution when there is concomitant use of a CYP enzyme inhibitor - No concomitant FLUVOXAMINE or SMOKING - Avoid grape juice Caution when using amiodarone, ciproflox, SSRI,

## 2013-04-19 NOTE — Assessment & Plan Note (Addendum)
 #  IPF - see booklet educational - start Prilosec 20mg  daily OTC  - read aboud drug below; Pirfenidone - I prefer pirfenidone over OFEV due to theoretical bleeding risk with hium on coumadin - at followup will make referral to rehab - buy pulse oximeter  #FOllowup - early Jan 2015 to discuss treatment for IPF  As of 04/19/2013  Pirfenidone is an approved drug in the [place] but approved in [place], [place], [place], and [place] for treatment of IPF  BASICS  - Believed to function as a general inhibitor of fibrosis and inflammation Exact mechanism of action unknown - Functions by preventing worsening of IPF. Does not reverse process - Studied only in IPF (not studied in other causes of UIP like autoimmune or asbestosis) - Studied only in mild to moderate severity of IPF (DLCO > 30% and FVC > 50%) - Marketed by company called  [employer], out of [place], [place]  - 1  [place] study and 2 [place]/European studies called CAPACITY and 1 International trial called ASCEND - Most recent result was ASCEND, results released Feb 2014 with positive results - Currently FDA reviewing whether to approve drug or not. - Currently in [place], drug available in > 80 centers through expanded accesss program (safety study and compassionate use): WE ARE one of the sites  RESULTS  - Proportion of patients with > 10% decline in FVC (meaningful parameter) is around 1/3 less with those taking pirfenidone at 52 weeks. Basically prevents decline in IPF  - This has  translated into potentially reduced mortality  (pooled analysis but not powered for it) and reduced hospitalization   DOSING - Dose is 2403 mg per day in 3 divided doses and gradually escalated to this dose over 15 days - Take with food  SIDE EFFECTS - Generally well tolerated. In ASCEND the DC RATE was 14% but this is only 4% more than placebo - GI dyspepsia most common but still tolerated . TAKE With FOOD - Skin irritant - So, wear high SPF (>50)  sun-screen, wear hat & full length clothes   - 1:100 to 1:1000 chance of angioedema in first 90 days  - rare side effect called agranulocytosis  CONTRAINDICATIONS - Severe Hepatic impairment or ESLD (> 5 times ULN) - CKD with Cr CL < 30 and ESRD - QTc b> (? Relative v absolute) - Pregnancy - Concomitant smoking   DRUG INTERACTIONS - Pirfenidone is metabolized through CYP1A2. So caution when there is concomitant use of a CYP enzyme inhibitor - No concomitant FLUVOXAMINE or SMOKING - Avoid grape juice Caution when using amiodarone, ciproflox, SSRI,   > 50% of this > 25 min visit spent in face to face counseling (15 min visit converted to 25 min)

## 2013-04-19 NOTE — Assessment & Plan Note (Signed)
#  Post surgical chest pain - try Take gabapentin 300mg  once daily x 3 days, then 300mg  twice daily x 3 days, then 300mg  three times daily to continue. If this makes you too sleepy or drowsy call us and we will cut your medication dosing down

## 2013-04-19 NOTE — Progress Notes (Signed)
Subjective:    Patient ID: Dale [name], male    DOB: August 09, 1945, 67 y.o.   MRN: [id number]  HPI   IOV 03/07/2013  REferred for ILD  67 obese, former smoker, works as a Nutritional therapist for 40 years with constant exposure to  refrigerant coolants but no acute massive or exposure. Reports that as of a year ago was able to go hunting and haul deeer and do extreme physical activity without any problems. Then approximately 3 months ago started developing insidious onset of cough that initially was only at nighttime and early morning but then progressed involving the whole day. Cough is associated with white sputum only. At no point he had colored sputum or fever. Of 3-4 weeks ago cough was severe. Is also associated with insidious onset of dyspnea 2 months ago. This also is been progressive. Dyspnea is mild to moderate in intensity and present with class II level of exertion and relieved with rest without any associated chest pain. Both symptoms are not associated with syncope, dizziness, orthostasis, edema, chest pain.  Approximately 3 weeks ago primary care physician diagnosed him to have pneumonia on a chest x-ray [to me this looks like diffuse interstitial lung disease]. Then on 02/28/2013 when he saw his cardiologist for routine followup interstitial lung disease was suspected and was confirmed on a CT scan of the chest. Therefore he is been referred here  Exposure history and ILD risk factor history  -Remote smoker - Active GERD taking TUMS 2-3 times a month - Exposure to refrigerant: For 40 years although this was not specifically been associated with ILD - Denies asbestos, metal dust, mildew, mold, sugarcane, titanium, beryllium exposure. - Denies being on amiodarone, nitrofurantoin, cancer chemotherapy or radiation therapy a BCG vaccination therapy  Etiological workup - CT scan of the chest 02/28/2013 as read by chest radiologist Dr. Reuel Boom entrikin: Possible UIP but favors NSIP  more strongly.  - Excess autoimmune panel March 03, 2013: Is negative   Performance measurement Walking desaturation test 185 feet x3 laps at brisk pace: Lowest pulse ox was 89% on room air  Pulmonary function test 03/07/2013 - Shows moderate restriction -FVC 2.6 L/63%. FEV1 2.2 L/71%. Ratio of 84. Total lung capacity 3.8 L/58%. Patient was unable to perform DLCO.   Overnight oxygen study done 03/04/2013 Lungs continues time with saturation less than 89% was 1 minute and 4 seconds. Longest time total pulse ox less than 89% was 18 minutes and 32 seconds but with only 12 seconds less than 80%.   OV 04/19/2013  Chief Complaint  Patient presents with  . Interstitial Lung Disease    Breathing is doing well. Reports DOE, chest tightness. Denies coughing or wheezing.   Followup interstitial lung disease results of surgical lung biopsy. Underwent surgical lung biopsy 03/31/2013 on the right side. Results are consistent with UIP. Therefore diagnosis is idiopathic pulmonary fibrosis. He presents with his daughter and his wife. Currently his main issue is that from a dyspnea standpoint he is stable but he has bruising in the right infra-axillary area that is improved after surgery but he is neurogenic pain with paresthesias that is significantly impairing quality of life. In fact this is interfering with today's conversation about diagnosis and prognosis and management. He still seems to be struggling with the diagnosis the disease and its implications and is not grasping or my conversation. However, his wife and daughter are fully on board and wanting a very rational conversation about diagnosis and implications and treatment  and side effects. We resolve that at this point in time will focus on the quality of his life control his pain and have further detail conversations about idiopathic pulmonary fibrosis in a month. Although we did discuss several aspects of your patchy pulmonary fibrosis [IPF] its  probe prognosis it's course expectations and new treatments    Review of Systems  Constitutional: Negative for fever and unexpected weight change.  HENT: Negative for congestion, dental problem, ear pain, nosebleeds, postnasal drip, rhinorrhea, sinus pressure, sneezing, sore throat and trouble swallowing.   Eyes: Negative for redness and itching.  Respiratory: Positive for shortness of breath. Negative for cough, chest tightness and wheezing.   Cardiovascular: Negative for palpitations and leg swelling.  Gastrointestinal: Negative for nausea and vomiting.  Genitourinary: Negative for dysuria.  Musculoskeletal: Negative for joint swelling.  Skin: Negative for rash.  Neurological: Negative for headaches.  Hematological: Does not bruise/bleed easily.  Psychiatric/Behavioral: Negative for dysphoric mood. The patient is not nervous/anxious.        Objective:   Physical Exam  Nursing note and vitals reviewed. Constitutional: He is oriented to person, place, and time. He appears well-developed and well-nourished. No distress.  HENT:  Head: Normocephalic and atraumatic.  Right Ear: External ear normal.  Left Ear: External ear normal.  Mouth/Throat: Oropharynx is clear and moist. No oropharyngeal exudate.  Eyes: Conjunctivae and EOM are normal. Pupils are equal, round, and reactive to light. Right eye exhibits no discharge. Left eye exhibits no discharge. No scleral icterus.  Neck: Normal range of motion. Neck supple. No JVD present. No tracheal deviation present. No thyromegaly present.  Cardiovascular: Normal rate, regular rhythm and intact distal pulses.  Exam reveals no gallop and no friction rub.   No murmur heard. Pulmonary/Chest: Effort normal. No respiratory distress. He has no wheezes. He has rales. He exhibits no tenderness.  RT lateral chest wall with bruising  Sutures removed Hyperasthesia  Around surgical incisicon site  Abdominal: Soft. Bowel sounds are normal. He exhibits  no distension and no mass. There is no tenderness. There is no rebound and no guarding.  Musculoskeletal: Normal range of motion. He exhibits no edema and no tenderness.  Lymphadenopathy:    He has no cervical adenopathy.  Neurological: He is alert and oriented to person, place, and time. He has normal reflexes. No cranial nerve deficit. Coordination normal.  Skin: Skin is warm and dry. No rash noted. He is not diaphoretic. No erythema. No pallor.  Psychiatric: He has a normal mood and affect. His behavior is normal. Judgment and thought content normal.          Assessment & Plan:

## 2013-04-22 ENCOUNTER — Other Ambulatory Visit: Payer: Self-pay | Admitting: *Deleted

## 2013-04-22 ENCOUNTER — Other Ambulatory Visit: Payer: Self-pay

## 2013-04-22 DIAGNOSIS — G8918 Other acute postprocedural pain: Secondary | ICD-10-CM

## 2013-04-22 MED ORDER — QUINAPRIL-HYDROCHLOROTHIAZIDE 20-12.5 MG PO TABS: 1.0000 | ORAL_TABLET | Freq: Two times a day (BID) | ORAL | Status: DC

## 2013-04-22 MED ORDER — OXYCODONE-ACETAMINOPHEN 5-325 MG PO TABS: 1.0000 | ORAL_TABLET | ORAL | Status: DC | PRN

## 2013-04-24 NOTE — Discharge Summary (Signed)
patient examined and medical record reviewed,agree with above note. VAN TRIGT III,Diedra Sinor 04/24/2013   

## 2013-04-25 ENCOUNTER — Ambulatory Visit (INDEPENDENT_AMBULATORY_CARE_PROVIDER_SITE_OTHER): Payer: Medicare Other | Admitting: *Deleted

## 2013-04-25 ENCOUNTER — Encounter: Payer: Self-pay | Admitting: Internal Medicine

## 2013-04-25 DIAGNOSIS — R0602 Shortness of breath: Secondary | ICD-10-CM

## 2013-04-25 DIAGNOSIS — I509 Heart failure, unspecified: Secondary | ICD-10-CM

## 2013-04-25 DIAGNOSIS — I10 Essential (primary) hypertension: Secondary | ICD-10-CM

## 2013-04-25 LAB — BASIC METABOLIC PANEL
BUN: 17 mg/dL (ref 6–23)
CO2: 28 mEq/L (ref 19–32)
Calcium: 8.7 mg/dL (ref 8.4–10.5)
Creatinine, Ser: 0.8 mg/dL (ref 0.4–1.5)
GFR: 97.39 mL/min (ref >60.00)
Glucose, Bld: 78 mg/dL (ref 70–99)

## 2013-04-27 ENCOUNTER — Ambulatory Visit
Admission: RE | Admit: 2013-04-27 | Discharge: 2013-04-27 | Disposition: A | Discharge status: Home / Self Care | Payer: Medicare Other | Source: Ambulatory Visit | Attending: Cardiothoracic Surgery | Admitting: Cardiothoracic Surgery

## 2013-04-27 ENCOUNTER — Encounter: Payer: Self-pay | Admitting: Cardiothoracic Surgery

## 2013-04-27 ENCOUNTER — Ambulatory Visit (INDEPENDENT_AMBULATORY_CARE_PROVIDER_SITE_OTHER): Payer: Self-pay | Admitting: Cardiothoracic Surgery

## 2013-04-27 VITALS — BP 141/87 | HR 88 | Resp 20 | Ht 67.0 in | Wt 216.0 lb

## 2013-04-27 DIAGNOSIS — J8410 Pulmonary fibrosis, unspecified: Secondary | ICD-10-CM

## 2013-04-27 DIAGNOSIS — J84112 Idiopathic pulmonary fibrosis: Secondary | ICD-10-CM

## 2013-04-27 DIAGNOSIS — Z09 Encounter for follow-up examination after completed treatment for conditions other than malignant neoplasm: Secondary | ICD-10-CM

## 2013-04-27 NOTE — Progress Notes (Signed)
PCP is Isabella Stalling, MD Referring Provider is Kalman Shan, MD  Chief Complaint  Patient presents with  . Routine Post Op    2 WK F/U WITH CXR...has been seen by orthopedics for continued pain in IV hand/thumb    HPI: Surgical followup after right VATS and biopsy of right lung. 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 The patient is being considered for medications by his pulmonologist.  The patient's main problems now is post thoracotomy pain neuritic in nature. He is taking oxycodone and Neurontin. It is slowly getting better. The incisions themselves and healed nicely. Chest x-ray shows chronic lung disease, no pneumothorax or pleural effusion.  Patient also was complaining of his left thumb swelling and pain which is being followed by Dr. Melvyn Novas of orthopedics.  Past Medical History  Diagnosis Date  . Pure hyperglyceridemia   . S/P aortic valve replacement   . Other left bundle branch block   . Other dyspnea and respiratory abnormality   . Obesity, unspecified   . Unspecified arthropathy, lower leg   . Internal hemorrhoids without mention of complication 02/24/2002    Colonoscopy   . Heart murmur   . Pulmonary fibrosis   . GERD (gastroesophageal reflux disease)   . Hemorrhoids   . Arthritis   . Cellulitis of hand, left     possible IV infiltration as source    Past Surgical History  Procedure Laterality Date  . Circumcision  05/01/2006  . Shoulder arthroscopy  11/14/2004    Left shoulder  . Pacemaker removal  09/03/00    followed by removal of both atreal and ventricular pacing leads in a  patient who had evidence of pacemaker lead crush as well as pocket infection  . Aortic valve replacement    . Right knee    . Joint replacement Right     partial  . Video assisted thoracoscopy Right 04/05/2013    Procedure: RIGHT VIDEO ASSISTED THORACOSCOPY,RIGHT MIDDLE LOBE & RIGHT UPPER LOBE LUNG BIOPSY;  Surgeon: Kerin Perna, MD;  Location: [facility] OR;   Service: Thoracic;  Laterality: Right;    Family History  Problem Relation Age of Onset  . Uterine cancer Mother   . Diabetes Sister     Social History History  Substance Use Topics  . Smoking status: Former Smoker -- 1.50 packs/day for 10 years    Types: Cigarettes    Quit date: 05/12/1973  . Smokeless tobacco: Never Used  . Alcohol Use: 0.0 oz/week     Comment: 2-3 beers occassionally    Current Outpatient Prescriptions  Medication Sig Dispense Refill  . albuterol (VENTOLIN HFA) 108 (90 BASE) MCG/ACT inhaler Inhale 2 puffs into the lungs every 6 (six) hours as needed for wheezing.  1 Inhaler  6  . aspirin 81 MG tablet Take 81 mg by mouth daily.       Marland Kitchen atorvastatin (LIPITOR) 80 MG tablet Take 80 mg by mouth daily.      . calcium carbonate (TUMS - DOSED IN MG ELEMENTAL CALCIUM) 500 MG chewable tablet Chew 1 tablet by mouth as needed for indigestion or heartburn.      . celecoxib (CELEBREX) 200 MG capsule Take 1 capsule (200 mg total) by mouth daily.  90 capsule  3  . docusate sodium (COLACE) 100 MG capsule Take 200 mg by mouth daily.      Marland Kitchen gabapentin (NEURONTIN) 300 MG capsule Take 1 tablet once daily x 3 days, then 1 tablet twice daily x 3  days, then 1 tablet three times a day and continue.  100 capsule  2  . multivitamin (THERAGRAN) per tablet Take 1 tablet by mouth daily.       . niacin (NIASPAN) 500 MG CR tablet Take 500 mg by mouth at bedtime.      Marland Kitchen omeprazole (PRILOSEC) 20 MG capsule Take 1 capsule (20 mg total) by mouth daily.  30 capsule  11  . oxyCODONE-acetaminophen (PERCOCET/ROXICET) 5-325 MG per tablet Take 1-2 tablets by mouth every 4 (four) hours as needed for severe pain.  40 tablet  0  . pregabalin (LYRICA) 300 MG capsule Take 300 mg by mouth 3 (three) times daily.      . quinapril-hydrochlorothiazide (ACCURETIC) 20-12.5 MG per tablet Take 1 tablet by mouth 2 (two) times daily.  60 tablet  3  . warfarin (COUMADIN) 5 MG tablet Take 1.5 tablets (7.5 mg total) by  mouth daily. Take 7.5 MG DAILY TILL pt/inr NEXT CHECK  30 tablet  0   No current facility-administered medications for this visit.    Allergies  Allergen Reactions  . Penicillins Swelling and Rash    Review of Systems main symptom is no thoracotomy pain. He is actually breathing well on room air and trying to stay active.  BP 141/87  Pulse 88  Resp 20  Ht 5\' 7"  (1.702 m)  Wt 216 lb (97.977 kg)  BMI 33.82 kg/m2  SpO2 94% Physical Exam Alert but uncomfortable Lungs with bilateral dry rales VATS incision is well-healed Heart rhythm regular No edema Splint over left hand in place  Diagnostic Tests: Chest x-ray shows  right lung biopsy site healing  No pneumothorax  Impression: Stable after right lung biopsy with VATS  Plan: Refill 4 oxycodone provided Patient will continue Neurontin Patient return for followup in 3-4 weeks.

## 2013-04-28 ENCOUNTER — Ambulatory Visit: Payer: Medicare Other | Admitting: Cardiovascular Disease

## 2013-05-09 ENCOUNTER — Encounter: Payer: Self-pay | Admitting: Cardiovascular Disease

## 2013-05-09 ENCOUNTER — Ambulatory Visit (INDEPENDENT_AMBULATORY_CARE_PROVIDER_SITE_OTHER): Payer: Medicare Other | Admitting: Cardiovascular Disease

## 2013-05-09 VITALS — BP 124/80 | HR 76 | Ht 67.0 in | Wt 218.8 lb

## 2013-05-09 DIAGNOSIS — J8410 Pulmonary fibrosis, unspecified: Secondary | ICD-10-CM

## 2013-05-09 DIAGNOSIS — I10 Essential (primary) hypertension: Secondary | ICD-10-CM

## 2013-05-09 DIAGNOSIS — J849 Interstitial pulmonary disease, unspecified: Secondary | ICD-10-CM

## 2013-05-09 DIAGNOSIS — Z954 Presence of other heart-valve replacement: Secondary | ICD-10-CM

## 2013-05-09 DIAGNOSIS — I509 Heart failure, unspecified: Secondary | ICD-10-CM

## 2013-05-09 DIAGNOSIS — Z952 Presence of prosthetic heart valve: Secondary | ICD-10-CM

## 2013-05-09 NOTE — Progress Notes (Signed)
Patient ID: Dale [name], male   DOB: 31-Aug-1945, 67 y.o.   MRN: [id number] Dale [name] is seen today for F.U of AVR in 1996 he had a cath in 2009 with normal cors. Last echo  11/14  Study Conclusions  - Left ventricle: The cavity size was mildly dilated. Wall thickness was increased in a pattern of mild LVH. Systolic function was normal. The estimated ejection fraction was 40%. Diffuse hypokinesis. - Aortic valve: Tissue AVR stable with mild AR and gradient not much different than 2008 - Left atrium: The atrium was mildly dilated. - Right atrium: The atrium was mildly dilated. - Atrial septum: No defect or patent foramen ovale was identified.  Unfortunately has developed ILD that is progressive with marked dyspnea  04/05/13 had VATS by PVT to confirm diagnosis  Results consistant with UIP Or idiopathic interstitial fibrosis  BNP on 12/15 only 166  To start perfenidine Monday and pulmonary rehab.    Discussed his ETOH and need to stop and loose weight     ROS: Denies fever, malais, weight loss, blurry vision, decreased visual acuity, cough, sputum, SOB, hemoptysis, pleuritic pain, palpitaitons, heartburn, abdominal pain, melena, lower extremity edema, claudication, or rash.  All other systems reviewed and negative  General: Affect appropriate Obese white male  HEENT: normal Neck supple with no adenopathy JVP normal no bruits no thyromegaly Lungs mild basilar crackles  no wheezing and good diaphragmatic motion Heart:  S1/S2click SEM  murmur, no rub, gallop or click PMI normal Abdomen: benighn, BS positve, no tenderness, no AAA no bruit.  No HSM or HJR Distal pulses intact with no bruits No edema Neuro non-focal Skin warm and dry No muscular weakness   Current Outpatient Prescriptions  Medication Sig Dispense Refill  . albuterol (VENTOLIN HFA) 108 (90 BASE) MCG/ACT inhaler Inhale 2 puffs into the lungs every 6 (six) hours as needed for wheezing.  1 Inhaler  6  . aspirin 81 MG  tablet Take 81 mg by mouth daily.       Marland Kitchen atorvastatin (LIPITOR) 80 MG tablet Take 80 mg by mouth daily.      . calcium carbonate (TUMS - DOSED IN MG ELEMENTAL CALCIUM) 500 MG chewable tablet Chew 1 tablet by mouth as needed for indigestion or heartburn.      . celecoxib (CELEBREX) 200 MG capsule Take 1 capsule (200 mg total) by mouth daily.  90 capsule  3  . docusate sodium (COLACE) 100 MG capsule Take 200 mg by mouth daily.      Marland Kitchen gabapentin (NEURONTIN) 300 MG capsule Take 1 tablet once daily x 3 days, then 1 tablet twice daily x 3 days, then 1 tablet three times a day and continue.  100 capsule  2  . multivitamin (THERAGRAN) per tablet Take 1 tablet by mouth daily.       . niacin (NIASPAN) 500 MG CR tablet Take 500 mg by mouth at bedtime.      Marland Kitchen omeprazole (PRILOSEC) 20 MG capsule Take 1 capsule (20 mg total) by mouth daily.  30 capsule  11  . oxyCODONE-acetaminophen (PERCOCET/ROXICET) 5-325 MG per tablet Take 1-2 tablets by mouth every 4 (four) hours as needed for severe pain.  40 tablet  0  . quinapril-hydrochlorothiazide (ACCURETIC) 20-12.5 MG per tablet Take 1 tablet by mouth 2 (two) times daily.  60 tablet  3  . warfarin (COUMADIN) 5 MG tablet Take 1.5 tablets (7.5 mg total) by mouth daily. Take 7.5 MG DAILY TILL pt/inr NEXT CHECK  30 tablet  0   No current facility-administered medications for this visit.    Allergies  Penicillins  Electrocardiogram:  11/25  SR rate 71 limb lead reversal LVH   Assessment and Plan

## 2013-05-09 NOTE — Assessment & Plan Note (Signed)
Normal function by echo.  F/U coumadin clinic  Note cannot have MRI's as he has some retained pacer leads

## 2013-05-09 NOTE — Assessment & Plan Note (Signed)
Exam improved VATS site healing with some neuropathic pain.  F/U pulmonary rehab and start perphenidine

## 2013-05-09 NOTE — Assessment & Plan Note (Signed)
Well controlled.  Continue current medications and low sodium Dash type diet.    

## 2013-05-09 NOTE — Assessment & Plan Note (Signed)
Improved BNP low EF 40%  F/U echo 3 months now that he has stopped drinking excess beer

## 2013-05-09 NOTE — Patient Instructions (Signed)
Your physician recommends that you schedule a follow-up appointment in:    3 MONTHS WITH  DR NISHAN  AND  ECHO  SAME DAY  Your physician recommends that you continue on your current medications as directed. Please refer to the Current Medication list given to you today. Your physician has requested that you have an echocardiogram. Echocardiography is a painless test that uses sound waves to create images of your heart. It provides your doctor with information about the size and shape of your heart and how well your heart's chambers and valves are working. This procedure takes approximately one hour. There are no restrictions for this procedure.    

## 2013-05-11 ENCOUNTER — Encounter (HOSPITAL_COMMUNITY): Payer: Self-pay | Admitting: Anesthesiology

## 2013-05-25 ENCOUNTER — Ambulatory Visit: Payer: Medicare Other | Admitting: Cardiothoracic Surgery

## 2013-05-25 ENCOUNTER — Encounter: Payer: Self-pay | Admitting: Internal Medicine

## 2013-05-25 ENCOUNTER — Ambulatory Visit (INDEPENDENT_AMBULATORY_CARE_PROVIDER_SITE_OTHER): Payer: Self-pay | Admitting: Cardiothoracic Surgery

## 2013-05-25 ENCOUNTER — Encounter: Payer: Self-pay | Admitting: Cardiothoracic Surgery

## 2013-05-25 ENCOUNTER — Ambulatory Visit (INDEPENDENT_AMBULATORY_CARE_PROVIDER_SITE_OTHER): Payer: Medicare Other | Admitting: Internal Medicine

## 2013-05-25 VITALS — BP 136/83 | HR 70 | Resp 20 | Ht 67.0 in | Wt 218.0 lb

## 2013-05-25 VITALS — BP 110/76 | HR 78 | Temp 97.8°F | Ht 67.0 in | Wt 216.0 lb

## 2013-05-25 DIAGNOSIS — J8410 Pulmonary fibrosis, unspecified: Secondary | ICD-10-CM

## 2013-05-25 DIAGNOSIS — J84112 Idiopathic pulmonary fibrosis: Secondary | ICD-10-CM

## 2013-05-25 DIAGNOSIS — Z09 Encounter for follow-up examination after completed treatment for conditions other than malignant neoplasm: Secondary | ICD-10-CM

## 2013-05-25 NOTE — Patient Instructions (Addendum)
#  IPF - progressive disease over few to several years  - refer pulmonary rehab for IPF to [facility] - do weigiht loss; follow low glycemic diet sheet - eat foods on left lane only -start Pirfenidone; meet [facility] for paper work  - explained risks, benefits and limitations of pirfenidone  #Skin lesions on inner thigh  - if they do not go away, refer to derm; let us know  #Followup  6 weeks; LFT at followup Walk test at followup

## 2013-05-25 NOTE — Assessment & Plan Note (Signed)
#  IPF - progressive disease over few to several years  - refer pulmonary rehab for IPF to [facility] - do weigiht loss; follow low glycemic diet sheet - eat foods on left lane only -start Pirfenidone; meet [facility] for paper work  - explained risks, benefits and limitations of pirfenidone  #Skin lesions on inner thigh  - if they do not go away, refer to derm; let us know  #Followup  6 weeks; LFT at followup Walk test at followup  > 50% of this > 25 min visit spent in face to face counseling (15 min visit converted to 25 min)

## 2013-05-25 NOTE — Progress Notes (Signed)
   Subjective:    Patient ID: Dale [name], male    DOB: 1945/08/03, 68 y.o.   MRN: [id number]  HPI  IPF followup  Last visit he was post biopsy. He wa sin pain post op and too emtotional about dx. So we postponed discussion about diseae. Currently here to discuss IPF expectations and Rx and mgmt. HE is calmer and in tune with his disease and more willing to listen. Wife and dtr with him. Has questions on prognosis, on prifenidone Rx and how to impact dyspnea. He is able to walk and do ADLS and walk a mile without a problem. However, when carrying 30# objects he gets dyspneic. No cough. No edema.   Past, Family, Social reviewed: has new skin lesions, dry, sclay on inner thigh, CVTS has given him some creamReview of Systems  Constitutional: Negative for fever and unexpected weight change.  HENT: Negative for congestion, dental problem, ear pain, nosebleeds, postnasal drip, rhinorrhea, sinus pressure, sneezing, sore throat and trouble swallowing.   Eyes: Negative for redness and itching.  Respiratory: Positive for shortness of breath (with lifting heavy objects). Negative for cough, chest tightness and wheezing.   Cardiovascular: Negative for palpitations and leg swelling.  Gastrointestinal: Negative for nausea and vomiting.  Genitourinary: Negative for dysuria.  Musculoskeletal: Negative for joint swelling.  Skin: Negative for rash.  Neurological: Negative for headaches.  Hematological: Does not bruise/bleed easily.  Psychiatric/Behavioral: Negative for dysphoric mood. The patient is not nervous/anxious.        Objective:   Physical Exam  Nursing note and vitals reviewed. Constitutional: He is oriented to person, place, and time. He appears well-developed and well-nourished. No distress.  Body mass index is 33.82 kg/(m^2).   HENT:  Head: Normocephalic and atraumatic.  Right Ear: External ear normal.  Left Ear: External ear normal.  Mouth/Throat: Oropharynx is clear and moist. No  oropharyngeal exudate.  Eyes: Conjunctivae and EOM are normal. Pupils are equal, round, and reactive to light. Right eye exhibits no discharge. Left eye exhibits no discharge. No scleral icterus.  Neck: Normal range of motion. Neck supple. No JVD present. No tracheal deviation present. No thyromegaly present.  Cardiovascular: Normal rate, regular rhythm and intact distal pulses.  Exam reveals no gallop and no friction rub.   No murmur heard. Pulmonary/Chest: Effort normal. No respiratory distress. He has no wheezes. He has rales. He exhibits no tenderness.  RLL crackles  Abdominal: Soft. Bowel sounds are normal. He exhibits no distension and no mass. There is no tenderness. There is no rebound and no guarding.  Musculoskeletal: Normal range of motion. He exhibits no edema and no tenderness.  Lymphadenopathy:    He has no cervical adenopathy.  Neurological: He is alert and oriented to person, place, and time. He has normal reflexes. No cranial nerve deficit. Coordination normal.  Skin: Skin is warm and dry. No rash noted. He is not diaphoretic. No erythema. No pallor.  Psychiatric: He has a normal mood and affect. His behavior is normal. Judgment and thought content normal.           Assessment & Plan:

## 2013-05-25 NOTE — Progress Notes (Signed)
PCP is Maricela Curet, MD Referring Provider is Maricela Curet, MD  Chief Complaint  Patient presents with  . Routine Post Op    4 week f/u    HPI: Final postoperative office visit for right VATS for open lung biopsy. No patient of mine who underwent  aVR with mechanical valve about 15 years ago unfortunately developed UIP. His postthoracotomy pain is significantly better taking Neurontin and with passage of time. He is not on oxygen and can tolerate daily activitiesLopez. He is being treated by pulmonary medicine and will be started on a new medication for idiopathic pulmonary fibrosis. The incision is well-healed. Chest x-ray is without effusion or pneumothorax but with interstitial fibrosis  Past Medical History  Diagnosis Date  . Pure hyperglyceridemia   . S/P aortic valve replacement   . Other left bundle branch block   . Other dyspnea and respiratory abnormality   . Obesity, unspecified   . Unspecified arthropathy, lower leg   . Internal hemorrhoids without mention of complication 38/93/7342    Colonoscopy   . Heart murmur   . Pulmonary fibrosis   . GERD (gastroesophageal reflux disease)   . Hemorrhoids   . Arthritis   . Cellulitis of hand, left     possible IV infiltration as source    Past Surgical History  Procedure Laterality Date  . Circumcision  05/01/2006  . Shoulder arthroscopy  11/14/2004    Left shoulder  . Pacemaker removal  09/03/00    followed by removal of both atreal and ventricular pacing leads in a  patient who had evidence of pacemaker lead crush as well as pocket infection  . Aortic valve replacement    . Right knee    . Joint replacement Right     partial  . Video assisted thoracoscopy Right 04/05/2013    Procedure: RIGHT VIDEO ASSISTED THORACOSCOPY,RIGHT MIDDLE LOBE & RIGHT UPPER LOBE LUNG BIOPSY;  Surgeon: Ivin Poot, MD;  Location: [facility];  Service: Thoracic;  Laterality: Right;    Family History  Problem Relation Age of Onset   . Uterine cancer Mother   . Diabetes Sister     Social History History  Substance Use Topics  . Smoking status: Former Smoker -- 1.50 packs/day for 10 years    Types: Cigarettes    Quit date: 05/12/1973  . Smokeless tobacco: Never Used  . Alcohol Use: 0.0 oz/week     Comment: 2-3 beers occassionally    Current Outpatient Prescriptions  Medication Sig Dispense Refill  . albuterol (VENTOLIN HFA) 108 (90 BASE) MCG/ACT inhaler Inhale 2 puffs into the lungs every 6 (six) hours as needed for wheezing.  1 Inhaler  6  . aspirin 81 MG tablet Take 81 mg by mouth daily.       Marland Kitchen atorvastatin (LIPITOR) 80 MG tablet Take 80 mg by mouth daily.      . celecoxib (CELEBREX) 200 MG capsule Take 1 capsule (200 mg total) by mouth daily.  90 capsule  3  . docusate sodium (COLACE) 100 MG capsule Take 200 mg by mouth daily.      Marland Kitchen gabapentin (NEURONTIN) 300 MG capsule Take 1 tablet once daily x 3 days, then 1 tablet twice daily x 3 days, then 1 tablet three times a day and continue.  100 capsule  2  . multivitamin (THERAGRAN) per tablet Take 1 tablet by mouth daily.       . niacin (NIASPAN) 500 MG CR tablet Take 500 mg by mouth  at bedtime.      Marland Kitchen omeprazole (PRILOSEC) 20 MG capsule Take 1 capsule (20 mg total) by mouth daily.  30 capsule  11  . oxyCODONE-acetaminophen (PERCOCET/ROXICET) 5-325 MG per tablet Take 1-2 tablets by mouth every 4 (four) hours as needed for severe pain.  40 tablet  0  . quinapril-hydrochlorothiazide (ACCURETIC) 20-12.5 MG per tablet Take 1 tablet by mouth 2 (two) times daily.  60 tablet  3  . warfarin (COUMADIN) 5 MG tablet Take 1.5 tablets (7.5 mg total) by mouth daily. Take 7.5 MG DAILY TILL pt/inr NEXT CHECK  30 tablet  0   No current facility-administered medications for this visit.    Allergies  Allergen Reactions  . Penicillins Swelling and Rash    Review of Systems overall improvement  BP 136/83  Pulse 70  Resp 20  Ht 5\' 7"  (1.702 m)  Wt 218 lb (98.884 kg)  BMI  34.14 kg/m2  SpO2 96% Physical Exam Lungs clear Incision well-healed Regular cardiac rhythm normal aortic valve closure sounds  Diagnostic Tests:   Impression: Good recovery from right VATS-open lung biopsy for UIP  Plan: Return as needed

## 2013-06-08 ENCOUNTER — Encounter: Payer: Self-pay | Admitting: Cardiovascular Disease

## 2013-06-15 ENCOUNTER — Telehealth: Payer: Self-pay | Admitting: Internal Medicine

## 2013-06-15 NOTE — Telephone Encounter (Signed)
Called and spoke with spouse. She reports she has not heard from pulm rehab [facility] or regarding the medication for pirfenidone. She reports she turned in the paper work already for the medication. I called and spoke with Diane from [facility]. Was told pt is on list to call. Will call by Friday I called spouse back and made her aware. I called [facility] for esbriet [phone]. Was told they do not have documentation they received pt paperwork. I have refaxed this over and will call back later this afternoon to check on this. Was advised it can take up to 3-4 hrs to be processed and scanned into chart.

## 2013-06-16 NOTE — Telephone Encounter (Signed)
I called [facility] and they received the forms yesterday. I spoke with Nunzio Cory and she states the [facility] will [facility] and they will begin to process the order. She states it will take a few days for the pt to gt the medication. I spoke with the pt spouse and advised.Colville Bing, CMA

## 2013-06-20 ENCOUNTER — Other Ambulatory Visit: Payer: Self-pay

## 2013-06-20 ENCOUNTER — Telehealth: Payer: Self-pay

## 2013-06-20 MED ORDER — QUINAPRIL-HYDROCHLOROTHIAZIDE 20-12.5 MG PO TABS: 1.0000 | ORAL_TABLET | Freq: Two times a day (BID) | ORAL | Status: DC

## 2013-06-20 MED ORDER — CELECOXIB 200 MG PO CAPS: 200.0000 mg | ORAL_CAPSULE | Freq: Every day | ORAL | Status: DC

## 2013-06-20 MED ORDER — ATORVASTATIN CALCIUM 80 MG PO TABS: 80.0000 mg | ORAL_TABLET | Freq: Every day | ORAL | Status: DC

## 2013-06-20 MED ORDER — FENOFIBRATE 145 MG PO TABS: 145.0000 mg | ORAL_TABLET | Freq: Every day | ORAL | Status: DC

## 2013-06-20 MED ORDER — NIACIN ER (ANTIHYPERLIPIDEMIC) 500 MG PO TBCR: 500.0000 mg | EXTENDED_RELEASE_TABLET | Freq: Every day | ORAL | Status: DC

## 2013-06-20 NOTE — Telephone Encounter (Signed)
refill 

## 2013-06-21 ENCOUNTER — Other Ambulatory Visit: Payer: Self-pay | Admitting: *Deleted

## 2013-06-21 ENCOUNTER — Telehealth: Payer: Self-pay | Admitting: Internal Medicine

## 2013-06-21 ENCOUNTER — Encounter: Payer: Self-pay | Admitting: Internal Medicine

## 2013-06-21 MED ORDER — CELECOXIB 200 MG PO CAPS: 200.0000 mg | ORAL_CAPSULE | Freq: Every day | ORAL | Status: DC

## 2013-06-21 NOTE — Telephone Encounter (Signed)
I have faxed over medlist. Nothing further needed

## 2013-06-21 NOTE — Telephone Encounter (Signed)
Patients wife request celebrex to be sent to [facility] instead of [facility] due to cost difference.

## 2013-07-05 ENCOUNTER — Encounter (HOSPITAL_COMMUNITY)
Admission: RE | Admit: 2013-07-05 | Discharge: 2013-07-05 | Disposition: A | Discharge status: Home / Self Care | Payer: Medicare Other | Source: Ambulatory Visit | Attending: Internal Medicine | Admitting: Internal Medicine

## 2013-07-05 VITALS — BP 112/70 | HR 74 | Ht 67.0 in | Wt 201.0 lb

## 2013-07-05 DIAGNOSIS — J84112 Idiopathic pulmonary fibrosis: Secondary | ICD-10-CM

## 2013-07-05 DIAGNOSIS — J849 Interstitial pulmonary disease, unspecified: Secondary | ICD-10-CM

## 2013-07-05 NOTE — Patient Instructions (Signed)
Pt has finished orientation and is scheduled to start PR on 07/11/13 at 2:30pm. Pt has been instructed to arrive to class 15 minutes early for scheduled class. Pt has been instructed to wear comfortable clothing and shoes with rubber soles. Pt has been told to take their medications 1 hour prior to coming to class.  If the patient is not going to attend class, he/she has been instructed to call.

## 2013-07-05 NOTE — Progress Notes (Addendum)
Patient was referred to PR due to IPF and ILD. During orientation advised patient on arrival and appointment times what to wear, what to do before, during and after exercise. Reviewed attendance and class policy. Talked about inclement weather and class consultation policy. Pt is scheduled to start Pulm Rehab on 07/11/13 at 2:30. Pt was advised to come to class 5 minutes before class starts. He was also given instructions on meeting with the dietician and attending the Family Structure classes. Pt is eager to get started. Patient was able to do six minute walk test. Talked about pursed lip breathing and given handout to practice it at home.

## 2013-07-06 ENCOUNTER — Encounter: Payer: Self-pay | Admitting: Internal Medicine

## 2013-07-06 ENCOUNTER — Telehealth: Payer: Self-pay | Admitting: Internal Medicine

## 2013-07-06 DIAGNOSIS — J849 Interstitial pulmonary disease, unspecified: Secondary | ICD-10-CM

## 2013-07-06 NOTE — Telephone Encounter (Signed)
Called [facility] and spoke with Case Manager Derrell Lolling who reported that pt's Esbriet has been ready for shipment by [facility] since 2.6.15 but in order to ship, pt must contact that pharmacy.  Asked Maudie Mercury if pt was ever notified that his medication was ready to ship and that he only needed to call [facility]?  Maudie Mercury was unable to answer this definitively, but stated that he should have been contacted.  Maudie Mercury stated that she will contact pt and give him the phone number to call [facility].  LMOM TCB x1 to discuss the above with patient and apologize for any inconvenience Will route to [facility] and copy to MR.

## 2013-07-06 NOTE — Telephone Encounter (Signed)
Dale [name]  He still has not gotten pirfenidone. Please find out why. Tell him we are sorry for this delay; new med/new process issues and we are struggling to understand as well and in reality is turning out different from what were told by manufacturesrs.  Will get to bottom of it. I had no idea he still had not gotten his med   Dr. Brand Males, M.D., [facility].C.P Pulmonary and Critical Care Medicine Staff Physician [facility] Pager: [phone], If no answer or between  15:00h - 7:00h: call [phone]  07/06/2013 4:33 PM       ----- Message from Horatio Pel, [place] sent at 07/06/2013 9:41 AM ----- ----- Message from [name] to Brand Males, MD sent at 07/06/2013 9:00 AM ----- I would like for Dr. Chase Caller to know I am cancelling my appointment for Friday because nothing he ordered for me since on my last visit has happened. NO med, NO pt. I cannot see coming to the follow up appointment since there has been nothing done at no fault of mine. I really hope the Dr. Alfonso Patten. is informed.

## 2013-07-06 NOTE — Telephone Encounter (Signed)
Pt wanted you to be aware of this.

## 2013-07-08 ENCOUNTER — Ambulatory Visit: Payer: Medicare Other | Admitting: Internal Medicine

## 2013-07-08 NOTE — Telephone Encounter (Signed)
Please give him update and have him call us as soon as meds arrived

## 2013-07-08 NOTE — Telephone Encounter (Signed)
Give him follolwup in 4 weeks with cbc, bmet and lft

## 2013-07-08 NOTE — Telephone Encounter (Signed)
I spoke with the pt spouse and medication was received today so pt will start tomorrow. [place] Bing, CMA

## 2013-07-11 ENCOUNTER — Encounter (HOSPITAL_COMMUNITY)
Admission: RE | Admit: 2013-07-11 | Discharge: 2013-07-11 | Disposition: A | Discharge status: Home / Self Care | Payer: Medicare Other | Source: Ambulatory Visit | Attending: Internal Medicine | Admitting: Internal Medicine

## 2013-07-11 DIAGNOSIS — Z5189 Encounter for other specified aftercare: Secondary | ICD-10-CM | POA: Insufficient documentation

## 2013-07-11 DIAGNOSIS — J8410 Pulmonary fibrosis, unspecified: Secondary | ICD-10-CM | POA: Insufficient documentation

## 2013-07-13 ENCOUNTER — Encounter (HOSPITAL_COMMUNITY)
Admission: RE | Admit: 2013-07-13 | Discharge: 2013-07-13 | Disposition: A | Discharge status: Home / Self Care | Payer: Medicare Other | Source: Ambulatory Visit | Attending: Internal Medicine | Admitting: Internal Medicine

## 2013-07-13 NOTE — Telephone Encounter (Signed)
[id number] to set appt, labs ordered.  [place] Bing, CMA

## 2013-07-14 NOTE — Telephone Encounter (Signed)
Spoke with the pt's spouse and have scheduled the pt ov for 08/11/13 at 9 am  Nothing further needed

## 2013-07-18 ENCOUNTER — Encounter (HOSPITAL_COMMUNITY): Payer: Medicare Other

## 2013-07-20 ENCOUNTER — Encounter (HOSPITAL_COMMUNITY)
Admission: RE | Admit: 2013-07-20 | Discharge: 2013-07-20 | Disposition: A | Discharge status: Home / Self Care | Payer: Medicare Other | Source: Ambulatory Visit | Attending: Internal Medicine | Admitting: Internal Medicine

## 2013-07-25 ENCOUNTER — Encounter (HOSPITAL_COMMUNITY)
Admission: RE | Admit: 2013-07-25 | Discharge: 2013-07-25 | Disposition: A | Discharge status: Home / Self Care | Payer: Medicare Other | Source: Ambulatory Visit | Attending: Internal Medicine | Admitting: Internal Medicine

## 2013-07-27 ENCOUNTER — Encounter (HOSPITAL_COMMUNITY): Payer: Medicare Other

## 2013-08-01 ENCOUNTER — Encounter (HOSPITAL_COMMUNITY)
Admission: RE | Admit: 2013-08-01 | Discharge: 2013-08-01 | Disposition: A | Discharge status: Home / Self Care | Payer: Medicare Other | Source: Ambulatory Visit | Attending: Internal Medicine | Admitting: Internal Medicine

## 2013-08-02 ENCOUNTER — Ambulatory Visit (INDEPENDENT_AMBULATORY_CARE_PROVIDER_SITE_OTHER): Payer: Medicare Other | Admitting: Cardiovascular Disease

## 2013-08-02 ENCOUNTER — Ambulatory Visit: Payer: Medicare Other | Admitting: Cardiovascular Disease

## 2013-08-02 ENCOUNTER — Other Ambulatory Visit (HOSPITAL_COMMUNITY): Payer: Medicare Other

## 2013-08-02 ENCOUNTER — Encounter: Payer: Self-pay | Admitting: Cardiovascular Disease

## 2013-08-02 ENCOUNTER — Ambulatory Visit (HOSPITAL_COMMUNITY): Payer: Medicare Other | Attending: Cardiology | Admitting: Cardiology

## 2013-08-02 VITALS — BP 136/80 | HR 56 | Ht 67.0 in | Wt 197.0 lb

## 2013-08-02 DIAGNOSIS — I509 Heart failure, unspecified: Secondary | ICD-10-CM | POA: Insufficient documentation

## 2013-08-02 DIAGNOSIS — J849 Interstitial pulmonary disease, unspecified: Secondary | ICD-10-CM

## 2013-08-02 DIAGNOSIS — I447 Left bundle-branch block, unspecified: Secondary | ICD-10-CM

## 2013-08-02 DIAGNOSIS — I10 Essential (primary) hypertension: Secondary | ICD-10-CM

## 2013-08-02 DIAGNOSIS — J8410 Pulmonary fibrosis, unspecified: Secondary | ICD-10-CM

## 2013-08-02 DIAGNOSIS — Z952 Presence of prosthetic heart valve: Secondary | ICD-10-CM

## 2013-08-02 DIAGNOSIS — Z954 Presence of other heart-valve replacement: Secondary | ICD-10-CM

## 2013-08-02 NOTE — Assessment & Plan Note (Signed)
Filling out paperwork to afford perfenidine   F/U pulmonary Lung exam still markedly abnormal

## 2013-08-02 NOTE — Assessment & Plan Note (Signed)
Normal function NO change in murmur  Crisp valve click SBE

## 2013-08-02 NOTE — Patient Instructions (Signed)
Your physician wants you to follow-up in:  6 months. You will receive a reminder letter in the mail two months in advance. If you don't receive a letter, please call our office to schedule the follow-up appointment.   

## 2013-08-02 NOTE — Assessment & Plan Note (Signed)
Well controlled.  Continue current medications and low sodium Dash type diet.    

## 2013-08-02 NOTE — Progress Notes (Signed)
Patient ID: Dale [name], male   DOB: March 30, 1946, 68 y.o.   MRN: [id number] Tylin is seen today for F.U of AVR in 1996 he had a cath in 2009 with normal cors. Echo today reviewed  Study Conclusions  - Left ventricle: The cavity size was mildly dilated. Wall thickness was increased in a pattern of mild LVH. Systolic function was moderately to severely reduced. The estimated ejection fraction was in the range of 30% to 35%. Diffuse hypokinesis. Doppler parameters are consistent with abnormal left ventricular relaxation (grade 1 diastolic dysfunction). Doppler parameters are consistent with high ventricular filling pressure. - Aortic valve: A bioprosthesis was present. There was mild stenosis. Trivial regurgitation. - Ascending aorta: The ascending aorta was mildly dilated. - Mitral valve: Calcified annulus. Mildly thickened leaflets . Mild regurgitation. - Left atrium: The atrium was moderately dilated. - Right ventricle: Systolic function was mildly reduced. - Right atrium: The atrium was mildly to moderately dilated. Impressions:  - Compared to 03/15/13, LV function slightly worse; prosthetic aortic valve with normal gradients; MAC with thickened MV; possible very mild prolapse.    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   Has lost 20 lbs and stopped drinking Doing great at pulmonary rehab   ROS: Denies fever, malais, weight loss, blurry vision, decreased visual acuity, cough, sputum, SOB, hemoptysis, pleuritic pain, palpitaitons, heartburn, abdominal pain, melena, lower extremity edema, claudication, or rash.  All other systems reviewed and negative  General: Affect appropriate Healthy:  appears stated age 88: normal Neck supple with no adenopathy JVP normal no  bruits no thyromegaly LungsILD fibrosis with no wheezing and good diaphragmatic motion Heart:  U2/G2 click SEM no murmur, no rub, gallop or click PMI normal Abdomen: benighn, BS positve, no tenderness, no AAA no bruit.  No HSM or HJR Distal pulses intact with no bruits No edema Neuro non-focal Skin warm and dry No muscular weakness   Current Outpatient Prescriptions  Medication Sig Dispense Refill  . albuterol (VENTOLIN HFA) 108 (90 BASE) MCG/ACT inhaler Inhale 2 puffs into the lungs every 6 (six) hours as needed for wheezing.  1 Inhaler  6  . aspirin 81 MG tablet Take 81 mg by mouth daily.       Marland Kitchen atorvastatin (LIPITOR) 80 MG tablet Take 1 tablet (80 mg total) by mouth daily.  90 tablet  1  . celecoxib (CELEBREX) 200 MG capsule Take 1 capsule (200 mg total) by mouth daily.  90 capsule  1  . docusate sodium (COLACE) 100 MG capsule Take 200 mg by mouth daily.      . fenofibrate (TRICOR) 145 MG tablet Take 1 tablet (145 mg total) by mouth daily.  90 tablet  3  . gabapentin (NEURONTIN) 300 MG capsule Take 1 tablet once daily x 3 days, then 1 tablet twice daily x 3 days, then 1 tablet three times a day and continue.  100 capsule  2  . multivitamin (THERAGRAN) per tablet Take 1 tablet by mouth daily.       . niacin (NIASPAN) 500 MG CR tablet Take 1 tablet (500 mg total) by mouth at bedtime.  90 tablet  1  . omeprazole (PRILOSEC) 20 MG capsule Take 1 capsule (20 mg total) by mouth daily.  30 capsule  11  . oxyCODONE-acetaminophen (PERCOCET/ROXICET) 5-325 MG per  tablet Take 1-2 tablets by mouth every 4 (four) hours as needed for severe pain.  40 tablet  0  . quinapril-hydrochlorothiazide (ACCURETIC) 20-12.5 MG per tablet Take 1 tablet by mouth 2 (two) times daily.  180 tablet  1  . warfarin (COUMADIN) 5 MG tablet Take 1.5 tablets (7.5 mg total) by mouth daily. Take 7.5 MG DAILY TILL pt/inr NEXT CHECK  30 tablet  0   No current facility-administered medications for this visit.     Allergies  Penicillins  Electrocardiogram:  SR limb lead reversal LVH   Assessment and Plan

## 2013-08-02 NOTE — Progress Notes (Signed)
Echo performed. 

## 2013-08-02 NOTE — Assessment & Plan Note (Signed)
Stable no high grade AV block or syncope  

## 2013-08-02 NOTE — Assessment & Plan Note (Signed)
Improved with diet and exercise continue current meds EF to my review looks about the same as before

## 2013-08-03 ENCOUNTER — Encounter (HOSPITAL_COMMUNITY)
Admission: RE | Admit: 2013-08-03 | Discharge: 2013-08-03 | Disposition: A | Discharge status: Home / Self Care | Payer: Medicare Other | Source: Ambulatory Visit | Attending: Internal Medicine | Admitting: Internal Medicine

## 2013-08-08 ENCOUNTER — Encounter (HOSPITAL_COMMUNITY)
Admission: RE | Admit: 2013-08-08 | Discharge: 2013-08-08 | Disposition: A | Discharge status: Home / Self Care | Payer: Medicare Other | Source: Ambulatory Visit | Attending: Internal Medicine | Admitting: Internal Medicine

## 2013-08-09 NOTE — Addendum Note (Signed)
Encounter addended by: Norlene Duel, RN on: 08/09/2013  8:53 AM<BR>     Documentation filed: Clinical Notes

## 2013-08-09 NOTE — Progress Notes (Signed)
Pulmonary Rehabilitation Program Outcomes Report   Orientation:  07/05/2013 !st week Report: 07/20/2013 Graduate Date:  tbd Discharge Date:  tbd # of sessions completed: 3 DX: IPF and ILD  Pulmonologist: Ramaswamy Family MD:  [name]  Class Time:  14:30  A.  Exercise Program:  Tolerates exercise @ 3.63 METS for 15 minutes and Walk Test Results:  Pre: Pre walk Test: Rest HR 74, BP 112/70, O2 97%, RPE 6 and RPD 7, 6 min HR 104, BP 118/70, O2 90%, RPE 8 and RPD 9, Post HR 81, BP 112/72, O2 99% , RPE 6 and RPD 7. Walked 1400 ft at 2.3mph at 3.0 METS.  B.  Mental Health:  Good mental attitude  C.  Education/Instruction/Skills  Accurately checks own pulse.  Rest:  83  Exercise:  117, Knows THR for exercise and Uses Perceived Exertion Scale and/or Dyspnea Scale  Uses Perceived Exertion Scale and/or Dyspnea Scale  D.  Nutrition/Weight Control/Body Composition:  Adherence to prescribed nutrition program: good    E.  Blood Lipids    No results found for this basename: CHOL, HDL, LDLCALC, LDLDIRECT, TRIG, CHOLHDL    F.  Lifestyle Changes:  Making positive lifestyle changes and Not smoking:  Quit 1980  G.  Symptoms noted with exercise:  Asymptomatic  Report Completed By:  Oletta Lamas. Tila Millirons RN   Comments:  This is patients 1st week report. He has done well. He achieved a peak METS of 3.63. His resting HR 81 and resting BP 116/74 and peak HR was 104 and peak BP 128/74. A report will follow upon patients 18th visit his halfway point.

## 2013-08-09 NOTE — Addendum Note (Signed)
Encounter addended by: Norlene Duel, RN on: 08/09/2013  8:52 AM<BR>     Documentation filed: Notes Section

## 2013-08-10 ENCOUNTER — Encounter (HOSPITAL_COMMUNITY)
Admission: RE | Admit: 2013-08-10 | Discharge: 2013-08-10 | Disposition: A | Discharge status: Home / Self Care | Payer: Medicare Other | Source: Ambulatory Visit | Attending: Internal Medicine | Admitting: Internal Medicine

## 2013-08-10 DIAGNOSIS — Z5189 Encounter for other specified aftercare: Secondary | ICD-10-CM | POA: Insufficient documentation

## 2013-08-10 DIAGNOSIS — J8410 Pulmonary fibrosis, unspecified: Secondary | ICD-10-CM | POA: Insufficient documentation

## 2013-08-11 ENCOUNTER — Encounter: Payer: Self-pay | Admitting: Internal Medicine

## 2013-08-11 ENCOUNTER — Ambulatory Visit (INDEPENDENT_AMBULATORY_CARE_PROVIDER_SITE_OTHER): Payer: Medicare Other | Admitting: Internal Medicine

## 2013-08-11 ENCOUNTER — Other Ambulatory Visit (INDEPENDENT_AMBULATORY_CARE_PROVIDER_SITE_OTHER): Payer: Medicare Other

## 2013-08-11 VITALS — BP 122/78 | HR 68 | Ht 67.0 in | Wt 199.6 lb

## 2013-08-11 DIAGNOSIS — J84112 Idiopathic pulmonary fibrosis: Secondary | ICD-10-CM

## 2013-08-11 DIAGNOSIS — J849 Interstitial pulmonary disease, unspecified: Secondary | ICD-10-CM

## 2013-08-11 DIAGNOSIS — J8410 Pulmonary fibrosis, unspecified: Secondary | ICD-10-CM

## 2013-08-11 LAB — BASIC METABOLIC PANEL
BUN: 16 mg/dL (ref 6–23)
CHLORIDE: 103 meq/L (ref 96–112)
CO2: 29 meq/L (ref 19–32)
CREATININE: 0.9 mg/dL (ref 0.4–1.5)
Calcium: 9.4 mg/dL (ref 8.4–10.5)
GFR: 88.72 mL/min (ref >60.00)
Glucose, Bld: 96 mg/dL (ref 70–99)
Potassium: 3.7 mEq/L (ref 3.5–5.1)
Sodium: 140 mEq/L (ref 135–145)

## 2013-08-11 LAB — CBC
HCT: 42.3 % (ref 39.0–52.0)
HEMOGLOBIN: 14.2 g/dL (ref 13.0–17.0)
MCHC: 33.5 g/dL (ref 30.0–36.0)
MCV: 88.1 fl (ref 78.0–100.0)
PLATELETS: 262 10*3/uL (ref 150.0–400.0)
RBC: 4.8 Mil/uL (ref 4.22–5.81)
RDW: 15 % — ABNORMAL HIGH (ref 11.5–14.6)
WBC: 11.9 10*3/uL — AB (ref 4.5–10.5)

## 2013-08-11 LAB — HEPATIC FUNCTION PANEL
ALT: 23 U/L (ref 0–53)
AST: 31 U/L (ref 0–37)
Albumin: 4.1 g/dL (ref 3.5–5.2)
Alkaline Phosphatase: 58 U/L (ref 39–117)
BILIRUBIN DIRECT: 0.1 mg/dL (ref 0.0–0.3)
TOTAL PROTEIN: 6.7 g/dL (ref 6.0–8.3)
Total Bilirubin: 0.7 mg/dL (ref 0.3–1.2)

## 2013-08-11 NOTE — Progress Notes (Signed)
Subjective:    Patient ID: Dale [name], male    DOB: February 10, 1946, 68 y.o.   MRN: [id number]  HPI   OV 08/11/2013  Chief Complaint  Patient presents with  . Follow-up    Pt states he has been on esbriet for 4 weeks. Pt states when he first takes the  first dose he gets some fatigue that will last about an hour and then improves.    Followup biopsy proven idiopathic pulmonary fibrosis-IPF   He is now on pirfenidone Pirfenidone for the last 1 month. 2 significant delay in him starting Pirfenidone due to insurance issues and autistic issues and work for issues both at our office and through [place] . Currently he is on it for one month. He is taking full dose 3 tablets 3 times a day. He is tolerating it just well. Early in the morning he has some fatigue that lasts one hour after Pirfenidone but this resolves. He does not want to make any changes to his regimen. He is attending pulmonary rehabilitation and is doing really well. Dyspnea stable to improved. He has intentionally lost weight. His BMI is now 31  Still have not discussed transplant. He is due for liver function test today and CBC as part of his monitoring Review of Systems  Constitutional: Negative for fever and unexpected weight change.  HENT: Negative for congestion, dental problem, ear pain, nosebleeds, postnasal drip, rhinorrhea, sinus pressure, sneezing, sore throat and trouble swallowing.   Eyes: Negative for redness and itching.  Respiratory: Negative for cough, chest tightness, shortness of breath and wheezing.   Cardiovascular: Negative for palpitations and leg swelling.  Gastrointestinal: Negative for nausea and vomiting.  Genitourinary: Negative for dysuria.  Musculoskeletal: Negative for joint swelling.  Skin: Negative for rash.  Neurological: Negative for headaches.  Hematological: Does not bruise/bleed easily.  Psychiatric/Behavioral: Negative for dysphoric mood. The patient is not nervous/anxious.         Objective:   Physical Exam  Nursing note and vitals reviewed. Constitutional: He is oriented to person, place, and time. He appears well-developed and well-nourished. No distress.  Reduced obesity Body mass index is 31.25 kg/(m^2).   HENT:  Head: Normocephalic and atraumatic.  Right Ear: External ear normal.  Left Ear: External ear normal.  Mouth/Throat: Oropharynx is clear and moist. No oropharyngeal exudate.  Eyes: Conjunctivae and EOM are normal. Pupils are equal, round, and reactive to light. Right eye exhibits no discharge. Left eye exhibits no discharge. No scleral icterus.  Neck: Normal range of motion. Neck supple. No JVD present. No tracheal deviation present. No thyromegaly present.  Cardiovascular: Normal rate, regular rhythm and intact distal pulses.  Exam reveals no gallop and no friction rub.   No murmur heard. Pulmonary/Chest: Effort normal. No respiratory distress. He has no wheezes. He has rales. He exhibits no tenderness.  Basal crackles R> L  Abdominal: Soft. Bowel sounds are normal. He exhibits no distension and no mass. There is no tenderness. There is no rebound and no guarding.  Visceral obesity  Musculoskeletal: Normal range of motion. He exhibits no edema and no tenderness.  Lymphadenopathy:    He has no cervical adenopathy.  Neurological: He is alert and oriented to person, place, and time. He has normal reflexes. No cranial nerve deficit. Coordination normal.  Skin: Skin is warm and dry. No rash noted. He is not diaphoretic. No erythema. No pallor.  Psychiatric: He has a normal mood and affect. His behavior is normal. Judgment and thought  content normal.          Assessment & Plan:

## 2013-08-11 NOTE — Patient Instructions (Signed)
#  IPF  - clinically stable  - glad you are tolerating pirfenidone; continue it  But make sure you use sunscreen - continue rehab  - continue weight loss  - today Body mass index is 31.25 kg/(m^2).   - goal BMI is 27 and below  - do cbc, lft blood test today - repeat cbc, lft in 4 weeks and 8 weeks at [facility] - return in 12 weeks with spirometry and walk test at followup   #FOllowup Spirometry in 12 weeks with [name] Wilson Walk test in office in 12 weeks LFT in 12 weeks FU Caroline Longie 12 weeks 

## 2013-08-11 NOTE — Assessment & Plan Note (Signed)
#  IPF  - clinically stable  - glad you are tolerating pirfenidone; continue it  But make sure you use sunscreen - continue rehab  - continue weight loss  - today Body mass index is 31.25 kg/(m^2).   - goal BMI is 27 and below  - do cbc, lft blood test today - repeat cbc, lft in 4 weeks and 8 weeks at [facility] - return in 12 weeks with spirometry and walk test at followup   #FOllowup Spirometry in 12 weeks with June Leap Walk test in office in 12 weeks LFT in 12 weeks FU Dwanna Goshert 12 weeks

## 2013-08-15 ENCOUNTER — Telehealth: Payer: Self-pay | Admitting: Internal Medicine

## 2013-08-15 ENCOUNTER — Encounter (HOSPITAL_COMMUNITY)
Admission: RE | Admit: 2013-08-15 | Discharge: 2013-08-15 | Disposition: A | Discharge status: Home / Self Care | Payer: Medicare Other | Source: Ambulatory Visit | Attending: Internal Medicine | Admitting: Internal Medicine

## 2013-08-15 DIAGNOSIS — Z5181 Encounter for therapeutic drug level monitoring: Secondary | ICD-10-CM

## 2013-08-15 NOTE — Telephone Encounter (Signed)
Looked in MR look at and did not see any PA on pt.  Spoke with [employer]. She is going to re fax this over to triage. Will await fax

## 2013-08-16 NOTE — Telephone Encounter (Signed)
Received the form and placed in MR's lookat to be signed

## 2013-08-17 ENCOUNTER — Encounter (HOSPITAL_COMMUNITY)
Admission: RE | Admit: 2013-08-17 | Discharge: 2013-08-17 | Disposition: A | Discharge status: Home / Self Care | Payer: Medicare Other | Source: Ambulatory Visit | Attending: Internal Medicine | Admitting: Internal Medicine

## 2013-08-17 NOTE — Telephone Encounter (Signed)
I have these forms and will work on [place]. [place] Bing, CMA

## 2013-08-18 ENCOUNTER — Encounter: Payer: Self-pay | Admitting: Internal Medicine

## 2013-08-18 NOTE — Telephone Encounter (Signed)
Forms completed and faxed on 08-17-13 to [phone] with OV notes, test results, etc. I have the paperwork at my desk and I will follow-up on approval/denial. [place] Bing, CMA

## 2013-08-18 NOTE — Telephone Encounter (Signed)
Delsa Sale, please advise status of forms thanks!

## 2013-08-19 ENCOUNTER — Encounter: Payer: Self-pay | Admitting: Internal Medicine

## 2013-08-19 NOTE — Telephone Encounter (Signed)
I called today to check on status of PA. It went into clinical review to the medical director, still awaiting response. I have sent the pt an email through [place] advising of this.

## 2013-08-22 ENCOUNTER — Encounter (HOSPITAL_COMMUNITY)
Admission: RE | Admit: 2013-08-22 | Discharge: 2013-08-22 | Disposition: A | Discharge status: Home / Self Care | Payer: Medicare Other | Source: Ambulatory Visit | Attending: Internal Medicine | Admitting: Internal Medicine

## 2013-08-23 ENCOUNTER — Encounter: Payer: Self-pay | Admitting: Internal Medicine

## 2013-08-23 NOTE — Telephone Encounter (Signed)
I called and spoke with [employer] to check on status of PA  And I was advised that the medication was approved from 08-17-13 to 08-18-14. I then called [facility] at [phone] to advise them of PA approval. They are aware. I also called [facility] and advised them of the approval as well. I have sent a mychart message to the pt advising him of the approval and that he should be getting a call from the pharmacy to ship the meds. [place] Bing, CMA

## 2013-08-24 ENCOUNTER — Encounter (HOSPITAL_COMMUNITY): Payer: Medicare Other

## 2013-08-29 ENCOUNTER — Encounter (HOSPITAL_COMMUNITY)
Admission: RE | Admit: 2013-08-29 | Discharge: 2013-08-29 | Disposition: A | Discharge status: Home / Self Care | Payer: Medicare Other | Source: Ambulatory Visit | Attending: Internal Medicine | Admitting: Internal Medicine

## 2013-08-31 ENCOUNTER — Encounter (HOSPITAL_COMMUNITY): Payer: Medicare Other

## 2013-09-05 ENCOUNTER — Encounter (HOSPITAL_COMMUNITY)
Admission: RE | Admit: 2013-09-05 | Discharge: 2013-09-05 | Disposition: A | Discharge status: Home / Self Care | Payer: Medicare Other | Source: Ambulatory Visit | Attending: Internal Medicine | Admitting: Internal Medicine

## 2013-09-07 ENCOUNTER — Encounter (HOSPITAL_COMMUNITY)
Admission: RE | Admit: 2013-09-07 | Discharge: 2013-09-07 | Disposition: A | Discharge status: Home / Self Care | Payer: Medicare Other | Source: Ambulatory Visit | Attending: Internal Medicine | Admitting: Internal Medicine

## 2013-09-08 ENCOUNTER — Encounter: Payer: Self-pay | Admitting: Adult Health

## 2013-09-08 ENCOUNTER — Ambulatory Visit (INDEPENDENT_AMBULATORY_CARE_PROVIDER_SITE_OTHER)
Admission: RE | Admit: 2013-09-08 | Discharge: 2013-09-08 | Disposition: A | Discharge status: Home / Self Care | Payer: Medicare Other | Source: Ambulatory Visit | Attending: Adult Health | Admitting: Adult Health

## 2013-09-08 ENCOUNTER — Ambulatory Visit (INDEPENDENT_AMBULATORY_CARE_PROVIDER_SITE_OTHER): Payer: Medicare Other | Admitting: Adult Health

## 2013-09-08 VITALS — BP 106/68 | HR 66 | Temp 97.4°F | Ht 67.0 in | Wt 196.8 lb

## 2013-09-08 DIAGNOSIS — J8410 Pulmonary fibrosis, unspecified: Secondary | ICD-10-CM

## 2013-09-08 DIAGNOSIS — J849 Interstitial pulmonary disease, unspecified: Secondary | ICD-10-CM

## 2013-09-08 DIAGNOSIS — J84112 Idiopathic pulmonary fibrosis: Secondary | ICD-10-CM

## 2013-09-08 NOTE — Assessment & Plan Note (Signed)
Doubt symptoms are related to underlying IPF  ?side effects of Pirfenidone vs post nasal drip Check cxr  If symptoms cont to be related to eating , need ov with PCP to evaluate   Plan  Warm heat to ribs.  Zyrtec 10mg  At bedtime  As needed  Drainage Saline nasal rinses As needed   Chest xray today  Follow up with family doctor if swallowing issues do not resolve.  Please contact office for sooner follow up if symptoms do not improve or worsen or seek emergency care  Follow up Dr. Chase Caller as planned and As needed

## 2013-09-08 NOTE — Progress Notes (Signed)
   Subjective:    Patient ID: Dale [name], male    DOB: 01-11-1946, 68 y.o.   MRN: [id number]  HPI OV 08/11/2013 Chief Complaint  Patient presents with  . Follow-up    Pt states he has been on esbriet for 4 weeks. Pt states when he first takes the  first dose he gets some fatigue that will last about an hour and then improves.   Followup biopsy proven idiopathic pulmonary fibrosis-IPF He is now on pirfenidone Pirfenidone for the last 1 month. 2 significant delay in him starting Pirfenidone due to insurance issues and autistic issues and work for issues both at our office and through [place] . Currently he is on it for one month. He is taking full dose 3 tablets 3 times a day. He is tolerating it just well. Early in the morning he has some fatigue that lasts one hour after Pirfenidone but this resolves. He does not want to make any changes to his regimen. He is attending pulmonary rehabilitation and is doing really well. Dyspnea stable to improved. He has intentionally lost weight. His BMI is now 31 Still have not discussed transplant. He is due for liver function test today and CBC as part of his monitoring   09/08/2013 Acute OV  Complains of sensation in chest when eating x 2 weeks.  Wants to make sure it is not related to Pirfenidone.  Does have intermittent heartburn, controlled on Prilosec.  Dyspnea and cough are unchanged.  Denies any choking, increased reflux, difficulty swallowing food. Has some nasal drip and drainage. Something draining in throat .  Complains of some right sided rib pain along the previous biopsy incision site .  No fever, hemoptysis, edema , increased dyspnea, orthopnea.  On ACE .  In pulmonary rehab, doing well but works very hard, and is sore a lot     Review of Systems  Constitutional: Negative for fever and unexpected weight change.  HENT: Negative for congestion, dental problem, ear pain, nosebleeds,  rhinorrhea, sinus pressure, sneezing, sore throat and  trouble swallowing.   Eyes: Negative for redness and itching.  Respiratory: +shortness of breath  Cardiovascular: Negative for palpitations and leg swelling.  Gastrointestinal: Negative for nausea and vomiting.  Genitourinary: Negative for dysuria.  Musculoskeletal: Negative for joint swelling.  Skin: Negative for rash.  Neurological: Negative for headaches.  Hematological: Does not bruise/bleed easily.  Psychiatric/Behavioral: Negative for dysphoric mood. The patient is not nervous/anxious.        Objective:   Physical Exam  Physical Exam GEN: A/Ox3; pleasant , NAD, elderly   HEENT:  [place]/AT,  EACs-clear, TMs-wnl, NOSE-clear, THROAT-clear, no lesions, no postnasal drip or exudate noted.   NECK:  Supple w/ fair ROM; no JVD; normal carotid impulses w/o bruits; no thyromegaly or nodules palpated; no lymphadenopathy.  RESP  Bibasilar crackles noted no accessory muscle use, no dullness to percussion  CARD:  RRR, no m/r/g  ,tr peripheral edema, pulses intact, no cyanosis or clubbing.  GI:   Soft & nt; nml bowel sounds; no organomegaly or masses detected.  Musco: Warm bil, no deformities or joint swelling noted.   Neuro: alert, no focal deficits noted.    Skin: Warm, no lesions or rashes           Assessment & Plan:

## 2013-09-08 NOTE — Progress Notes (Signed)
Quick Note:  Called spoke with patient's spouse. Advised of lab results / recs as stated by TP. Spouse verbalized understanding and denied any questions. ______ 

## 2013-09-08 NOTE — Patient Instructions (Signed)
Warm heat to ribs.  Zyrtec 10mg  At bedtime  As needed  Drainage Saline nasal rinses As needed   Chest xray today  Follow up with family doctor if swallowing issues do not resolve.  Please contact office for sooner follow up if symptoms do not improve or worsen or seek emergency care  Follow up Dr. Chase Caller as planned and As needed

## 2013-09-09 ENCOUNTER — Other Ambulatory Visit: Payer: Self-pay

## 2013-09-09 MED ORDER — NIACIN ER (ANTIHYPERLIPIDEMIC) 500 MG PO TBCR: 500.0000 mg | EXTENDED_RELEASE_TABLET | Freq: Every day | ORAL | Status: DC

## 2013-09-12 ENCOUNTER — Encounter (HOSPITAL_COMMUNITY)
Admission: RE | Admit: 2013-09-12 | Discharge: 2013-09-12 | Disposition: A | Discharge status: Home / Self Care | Payer: Medicare Other | Source: Ambulatory Visit | Attending: Internal Medicine | Admitting: Internal Medicine

## 2013-09-12 DIAGNOSIS — Z5189 Encounter for other specified aftercare: Secondary | ICD-10-CM | POA: Insufficient documentation

## 2013-09-12 DIAGNOSIS — J8410 Pulmonary fibrosis, unspecified: Secondary | ICD-10-CM | POA: Insufficient documentation

## 2013-09-13 NOTE — Progress Notes (Signed)
Pulmonary Rehabilitation Program Outcomes Report   Orientation:  07/05/2013 Halfway report: 08/29/2013 Graduate Date:  tbd Discharge Date:  tbd # of sessions completed: 12 DX: IPF idiopathic pulmonary fibrosis/ ILD interstitial lung disease   Pulmonologist: Chase Caller Family MD:  [name]  Class Time:  230  A.  Exercise Program:  Tolerates exercise @ 5.56 METS for 15 minutes  B.  Mental Health:  Good mental attitude  C.  Education/Instruction/Skills  Accurately checks own pulse.  Rest:  80  Exercise:  92, Knows THR for exercise and Uses Perceived Exertion Scale and/or Dyspnea Scale  Uses Perceived Exertion Scale and/or Dyspnea Scale  D.  Nutrition/Weight Control/Body Composition:  Adherence to prescribed nutrition program: good    E.  Blood Lipids    No results found for this basename: CHOL, HDL, LDLCALC, LDLDIRECT, TRIG, CHOLHDL    F.  Lifestyle Changes:  Making positive lifestyle changes and Not smoking:  Quit 1980  G.  Symptoms noted with exercise:  Asymptomatic  Report Completed By:  Oletta Lamas. Reilynn Lauro RN   Comments:  This is patients halfway report. He has done well while in rehab. He achieved apeak METS of 5.56. His resting HR was 80 and resting BP was 108/64 and peak HR was 92 and peak BP was 126/78. A graduation report will follow upon his 31 th visit.

## 2013-09-14 ENCOUNTER — Encounter (HOSPITAL_COMMUNITY)
Admission: RE | Admit: 2013-09-14 | Discharge: 2013-09-14 | Disposition: A | Discharge status: Home / Self Care | Payer: Medicare Other | Source: Ambulatory Visit | Attending: Internal Medicine | Admitting: Internal Medicine

## 2013-09-19 ENCOUNTER — Encounter (HOSPITAL_COMMUNITY)
Admission: RE | Admit: 2013-09-19 | Discharge: 2013-09-19 | Disposition: A | Discharge status: Home / Self Care | Payer: Medicare Other | Source: Ambulatory Visit | Attending: Internal Medicine | Admitting: Internal Medicine

## 2013-09-21 ENCOUNTER — Encounter (HOSPITAL_COMMUNITY)
Admission: RE | Admit: 2013-09-21 | Discharge: 2013-09-21 | Disposition: A | Discharge status: Home / Self Care | Payer: Medicare Other | Source: Ambulatory Visit | Attending: Internal Medicine | Admitting: Internal Medicine

## 2013-09-26 ENCOUNTER — Encounter (HOSPITAL_COMMUNITY): Payer: Medicare Other

## 2013-09-28 ENCOUNTER — Encounter (HOSPITAL_COMMUNITY)
Admission: RE | Admit: 2013-09-28 | Discharge: 2013-09-28 | Disposition: A | Discharge status: Home / Self Care | Payer: Medicare Other | Source: Ambulatory Visit | Attending: Internal Medicine | Admitting: Internal Medicine

## 2013-09-28 NOTE — Progress Notes (Addendum)
Patient graduated from Pulmonary Rehabilitation today on 09/28/13 after completing 19 sessions. He achieved LTG of 30 minutes of aerobic exercise at Max Met level of 3.2. All patients vitals are WNL. Patient has not met with dietician. Discharge instruction has been reviewed in detail and patient stated an understanding of material given. Patient plans to exercise at home on his Nustep and walking. Cardiac Rehab staff will make f/u calls at 1 month, 6 months, and 1 year. Patient had no complaints of any abnormal S/S or pain on their exit visit. Discussed home exercise program. Patient completed 6 minute walk test.

## 2013-10-11 NOTE — Progress Notes (Signed)
Pulmonary Rehabilitation Program Outcomes Report   Orientation:  07/05/2013  Graduate Date:  NA Discharge Date:  09/28/2013 # of sessions completed: 19 out of 24 DX: IPF  Pulmonologist: Chase Caller Family MD:  Lorriane Shire Class Time:  13:00  A.  Exercise Program:  Tolerates exercise @ 5.57 METS for 15 minutes, Walk Test Results:  Post: Post walk test. Rest HR 71, BP 98/50, O2 97% RPE 7,and RPD 7, 6 min HR 105, BP 110/80, 02 89%, RPE 10 and RPD 10, Post HR 78, BP 92/62, O2 97% RPE 7 and RPD 7 walked 1521ft at 2.93 mph at 3.2METS. and Discharged  B.  Mental Health:  Good mental attitude  C.  Education/Instruction/Skills  Accurately checks own pulse.  Rest:  71  Exercise: 105, Knows THR for exercise, Uses Perceived Exertion Scale and/or Dyspnea Scale and Attended all education classes  Uses Perceived Exertion Scale and/or Dyspnea Scale  D.  Nutrition/Weight Control/Body Composition:  Adherence to prescribed nutrition program: good    E.  Blood Lipids    No results found for this basename: CHOL, HDL, LDLCALC, LDLDIRECT, TRIG, CHOLHDL    F.  Lifestyle Changes:  Making positive lifestyle changes and Not smoking:  Quit 1980  G.  Symptoms noted with exercise:  Asymptomatic  Report Completed By:  Oletta Lamas. Rivaldo Hineman RN   Comments:  This is patients discharge report. He has done well in Rehab. He achieved a peak METS of 5.57. His resting HR was 71 and resting BP was 98/50, His peak HR was 105 and peak BP was 110/80. Acall will be made to patient upon his 1 month,6 month and 1 year to ensure exercise compliance.

## 2013-10-19 ENCOUNTER — Telehealth: Payer: Self-pay | Admitting: Internal Medicine

## 2013-10-19 NOTE — Telephone Encounter (Signed)
Called spoke w/ dominque. He just wanted to make sure it was okay to contact pt for delivery. i advised this was ok. Nothing further needed

## 2013-11-01 ENCOUNTER — Other Ambulatory Visit: Payer: Self-pay

## 2013-11-01 ENCOUNTER — Other Ambulatory Visit: Payer: Self-pay | Admitting: *Deleted

## 2013-11-01 MED ORDER — ATORVASTATIN CALCIUM 80 MG PO TABS: 80.0000 mg | ORAL_TABLET | Freq: Every day | ORAL | Status: DC

## 2013-11-01 MED ORDER — QUINAPRIL-HYDROCHLOROTHIAZIDE 20-12.5 MG PO TABS
1.0000 | ORAL_TABLET | Freq: Two times a day (BID) | ORAL | Status: DC
Start: 2013-11-01 — End: 2014-04-25

## 2013-11-01 MED ORDER — CELECOXIB 200 MG PO CAPS: 200.0000 mg | ORAL_CAPSULE | Freq: Every day | ORAL | Status: DC

## 2013-11-17 ENCOUNTER — Other Ambulatory Visit: Payer: Self-pay | Admitting: *Deleted

## 2013-11-17 MED ORDER — NIACIN ER (ANTIHYPERLIPIDEMIC) 500 MG PO TBCR: 500.0000 mg | EXTENDED_RELEASE_TABLET | Freq: Every day | ORAL | Status: DC

## 2013-12-26 ENCOUNTER — Ambulatory Visit: Payer: Medicare Other | Admitting: Internal Medicine

## 2014-01-04 ENCOUNTER — Other Ambulatory Visit (INDEPENDENT_AMBULATORY_CARE_PROVIDER_SITE_OTHER): Payer: Medicare Other

## 2014-01-04 ENCOUNTER — Ambulatory Visit: Payer: Medicare Other

## 2014-01-04 ENCOUNTER — Encounter: Payer: Self-pay | Admitting: Internal Medicine

## 2014-01-04 ENCOUNTER — Ambulatory Visit (INDEPENDENT_AMBULATORY_CARE_PROVIDER_SITE_OTHER): Payer: Medicare Other | Admitting: Internal Medicine

## 2014-01-04 VITALS — BP 108/74 | HR 64 | Ht 67.0 in | Wt 189.0 lb

## 2014-01-04 DIAGNOSIS — J84112 Idiopathic pulmonary fibrosis: Secondary | ICD-10-CM

## 2014-01-04 DIAGNOSIS — M792 Neuralgia and neuritis, unspecified: Secondary | ICD-10-CM

## 2014-01-04 DIAGNOSIS — E785 Hyperlipidemia, unspecified: Secondary | ICD-10-CM

## 2014-01-04 DIAGNOSIS — R059 Cough, unspecified: Secondary | ICD-10-CM

## 2014-01-04 DIAGNOSIS — R0789 Other chest pain: Secondary | ICD-10-CM

## 2014-01-04 DIAGNOSIS — R05 Cough: Secondary | ICD-10-CM

## 2014-01-04 DIAGNOSIS — R053 Chronic cough: Secondary | ICD-10-CM

## 2014-01-04 LAB — CBC
HCT: 43.8 % (ref 39.0–52.0)
HEMOGLOBIN: 14.7 g/dL (ref 13.0–17.0)
MCHC: 33.6 g/dL (ref 30.0–36.0)
MCV: 90.9 fl (ref 78.0–100.0)
Platelets: 293 10*3/uL (ref 150.0–400.0)
RBC: 4.82 Mil/uL (ref 4.22–5.81)
RDW: 13.3 % (ref 11.5–15.5)
WBC: 11.6 10*3/uL — ABNORMAL HIGH (ref 4.0–10.5)

## 2014-01-04 LAB — HEPATIC FUNCTION PANEL
ALT: 22 U/L (ref 0–53)
AST: 32 U/L (ref 0–37)
Albumin: 4.2 g/dL (ref 3.5–5.2)
Alkaline Phosphatase: 51 U/L (ref 39–117)
BILIRUBIN TOTAL: 0.7 mg/dL (ref 0.2–1.2)
Bilirubin, Direct: 0.1 mg/dL (ref 0.0–0.3)
Total Protein: 7.3 g/dL (ref 6.0–8.3)

## 2014-01-04 LAB — LIPID PANEL
CHOLESTEROL: 137 mg/dL (ref 0–200)
HDL: 56.9 mg/dL (ref >39.00)
LDL Cholesterol: 73 mg/dL (ref 0–99)
NonHDL: 80.1
TRIGLYCERIDES: 36 mg/dL (ref 0.0–149.0)
Total CHOL/HDL Ratio: 2
VLDL: 7.2 mg/dL (ref 0.0–40.0)

## 2014-01-04 LAB — BASIC METABOLIC PANEL
BUN: 16 mg/dL (ref 6–23)
CHLORIDE: 104 meq/L (ref 96–112)
CO2: 25 mEq/L (ref 19–32)
Calcium: 9.2 mg/dL (ref 8.4–10.5)
Creatinine, Ser: 0.9 mg/dL (ref 0.4–1.5)
GFR: 93.32 mL/min (ref >60.00)
Glucose, Bld: 97 mg/dL (ref 70–99)
POTASSIUM: 4.1 meq/L (ref 3.5–5.1)
Sodium: 139 mEq/L (ref 135–145)

## 2014-01-04 NOTE — Progress Notes (Signed)
Subjective:    Patient ID: Dale [name], male    DOB: 09-30-1945, 68 y.o.   MRN: [id number]  HPI  OV 08/11/2013 Chief Complaint  Patient presents with  . Follow-up    Pt states he has been on esbriet for 4 weeks. Pt states when he first takes the  first dose he gets some fatigue that will last about an hour and then improves.   Followup biopsy proven idiopathic pulmonary fibrosis-IPF He is now on pirfenidone Pirfenidone for the last 1 month. 2 significant delay in him starting Pirfenidone due to insurance issues and autistic issues and work for issues both at our office and through [place] . Currently he is on it for one month. He is taking full dose 3 tablets 3 times a day. He is tolerating it just well. Early in the morning he has some fatigue that lasts one hour after Pirfenidone but this resolves. He does not want to make any changes to his regimen. He is attending pulmonary rehabilitation and is doing really well. Dyspnea stable to improved. He has intentionally lost weight. His BMI is now 31 Still have not discussed transplant. He is due for liver function test today and CBC as part of his monitoring   09/08/2013 Acute OV  Complains of sensation in chest when eating x 2 weeks.  Wants to make sure it is not related to Pirfenidone.  Does have intermittent heartburn, controlled on Prilosec.  Dyspnea and cough are unchanged.  Denies any choking, increased reflux, difficulty swallowing food. Has some nasal drip and drainage. Something draining in throat .  Complains of some right sided rib pain along the previous biopsy incision site .  No fever, hemoptysis, edema , increased dyspnea, orthopnea.  On ACE .  In pulmonary rehab, doing well but works very hard, and is sore a lot    OV 01/04/2014  Chief Complaint  Patient presents with  . Follow-up    Pt c/o DOE. Pt c/o prod cough with white mucous x 2 weeks and c/o sharp pain under right breast, "like someone is shocking me".     FU  IPF: On Esbriet Rx since March 2015   - exercise tolerance is same. Has lost weight due to rehab and exercise and feeling better. Not using nocturnal o2; wants to rreturn it. Appareitnly only transient desats in rehab. Tolerating esbriet well except for transient post prandial fatigue. NO GI side effects. wEaring sun screen diligently.  Spirometry x fvc 2.47L/54%, One attempt only due to cough (2.6L in oct 2014 but today's attempt is poor quality) but if numbers are true then he has progressed but I doubt numbers can be trusted.  He is not a transplant candidate due to heart issues. Past 2 weeks cough worse - mild/moderat ewith some white sputum but no other ae-ipf symptims. He has not had LFT since April 2015    - new issues: right moderate chest pain at site of scar for biiopsy. Middle to back. Occurs only on local pressure and absent othwerise. Hyperesthetic in quality. Has been told by CVTS that is related to biopsy. Wife wants Rx for this   - Health : wants his lipid check today so he can consolidate blood draws. He has fasted   Review of Systems  Constitutional: Negative for fever and unexpected weight change.  HENT: Negative for congestion, dental problem, ear pain, nosebleeds, postnasal drip, rhinorrhea, sinus pressure, sneezing, sore throat and trouble swallowing.   Eyes: Negative for redness  and itching.  Respiratory: Positive for cough and shortness of breath. Negative for chest tightness and wheezing.   Cardiovascular: Negative for palpitations and leg swelling.  Gastrointestinal: Negative for nausea and vomiting.  Genitourinary: Negative for dysuria.  Musculoskeletal: Negative for joint swelling.  Skin: Negative for rash.  Neurological: Negative for headaches.  Hematological: Does not bruise/bleed easily.  Psychiatric/Behavioral: Negative for dysphoric mood. The patient is not nervous/anxious.    Current outpatient prescriptions:albuterol (VENTOLIN HFA) 108 (90 BASE) MCG/ACT  inhaler, Inhale 2 puffs into the lungs every 6 (six) hours as needed for wheezing., Disp: 1 Inhaler, Rfl: 6;  aspirin 81 MG tablet, Take 81 mg by mouth daily. , Disp: , Rfl: ;  atorvastatin (LIPITOR) 80 MG tablet, Take 1 tablet (80 mg total) by mouth daily., Disp: 90 tablet, Rfl: 1 celecoxib (CELEBREX) 200 MG capsule, Take 1 capsule (200 mg total) by mouth daily., Disp: 90 capsule, Rfl: 1;  docusate sodium (COLACE) 100 MG capsule, Take 200 mg by mouth daily., Disp: , Rfl: ;  fenofibrate (TRICOR) 145 MG tablet, Take 1 tablet (145 mg total) by mouth daily., Disp: 90 tablet, Rfl: 3;  multivitamin (THERAGRAN) per tablet, Take 1 tablet by mouth daily. , Disp: , Rfl:  niacin (NIASPAN) 500 MG CR tablet, Take 1 tablet (500 mg total) by mouth at bedtime., Disp: 90 tablet, Rfl: 0;  omeprazole (PRILOSEC) 20 MG capsule, Take 1 capsule (20 mg total) by mouth daily., Disp: 30 capsule, Rfl: 11;  oxyCODONE-acetaminophen (PERCOCET/ROXICET) 5-325 MG per tablet, Take 1-2 tablets by mouth every 4 (four) hours as needed for severe pain., Disp: 40 tablet, Rfl: 0 Pirfenidone (ESBRIET) 267 MG CAPS, Take 3 capsules by mouth 3 (three) times daily., Disp: , Rfl: ;  quinapril-hydrochlorothiazide (ACCURETIC) 20-12.5 MG per tablet, Take 1 tablet by mouth 2 (two) times daily., Disp: 180 tablet, Rfl: 1;  warfarin (COUMADIN) 5 MG tablet, Take 1.5 tablets (7.5 mg total) by mouth daily. Take 7.5 MG DAILY TILL pt/inr NEXT CHECK, Disp: 30 tablet, Rfl: 0     Objective:   Physical Exam   Filed Vitals:   01/04/14 0926  BP: 108/74  Pulse: 64  Height: 5\' 7"  (1.702 m)  Weight: 189 lb (85.73 kg)  SpO2: 96%   Estimated body mass index is 29.59 kg/(m^2) as calculated from the following:   Height as of this encounter: 5\' 7"  (1.702 m).   Weight as of this encounter: 189 lb (85.73 kg).  GEN: A/Ox3; pleasant , NAD, elderly - has lost weight  HEENT:  [place]/AT,  EACs-clear, TMs-wnl, NOSE-clear, THROAT-clear, no lesions, no postnasal drip or  exudate noted.   NECK:  Supple w/ fair ROM; no JVD; normal carotid impulses w/o bruits; no thyromegaly or nodules palpated; no lymphadenopathy.  RESP  Bibasilar crackles noted no accessory muscle use, no dullness to percussion  CARD:  RRR, no m/r/g  ,tr peripheral edema, pulses intact, no cyanosis or clubbing.  GI:   Soft & nt; nml bowel sounds; no organomegaly or masses detected.  Musco: Warm bil, no deformities or joint swelling noted.   Neuro: alert, no focal deficits noted.        Assessment & Plan:  #IPF  - clinically stable - not a transplant candidate due to heart issues; only option is trials  - not a ofev candidate due to heart issues  - glad you are tolerating pirfenidone; continue it  But make sure you use sunscreen - continue exercise  - continue weight loss; proud you are  on track and losing weight  - last visit BMI was 31  - today  Body mass index is 29.59 kg/(m^2).  - goal BMI is 27 and below - for cough: ok to use nebulizer becuaes it helps  - do cbc, bmet., lft blood test today - return in 12 weeks with spirometry and office walk test (not 6 min walk test) at followup - will keep you posted on new trials forr IPF - we discussed this and appreciate your interest   #Cough  - due to IPF but could be due to accuretic too - I will talk to Dr Johnsie Cancel about changing this and get back to you  #Right sided chest pain  - this is likely due to neuropathic pain  - try neurontin 100 or 300mg  once at bedtime (can make you foggy or sleepy); if you need script call us  #health maintenance  - do lipid profile today  #FOllowup FUll PFT in 12 weeks with June Leap LFT in 12 weeks FU [place] 12 weeks

## 2014-01-04 NOTE — Patient Instructions (Addendum)
#  IPF  - clinically stable - not a transplant candidate due to heart issues; only option is trials  - not a ofev candidate due to heart issues  - glad you are tolerating pirfenidone; continue it  But make sure you use sunscreen - continue exercise  - continue weight loss; proud you are on track and losing weight  - last visit BMI was 31  - today  Body mass index is 29.59 kg/(m^2).  - goal BMI is 27 and below - for cough: ok to use nebulizer becuaes it helps  - do cbc, bmet., lft blood test today - return in 12 weeks with spirometry and office walk test (not 6 min walk test) at followup - will keep you posted on new trials forr IPF - we discussed this and appreciate your interest   #Cough  - due to IPF but could be due to accuretic too - I will talk to Dr Johnsie Cancel about changing this and get back to you  #Right sided chest pain  - this is likely due to neuropathic pain  - try neurontin 100 or 300mg  once at bedtime (can make you foggy or sleepy); if you need script call us  #health maintenance  - do lipid profile today  #FOllowup FUll PFT in 12 weeks with June Leap LFT in 12 weeks FU [place] 12 weeks

## 2014-01-07 ENCOUNTER — Telehealth: Payer: Self-pay | Admitting: Internal Medicine

## 2014-01-07 DIAGNOSIS — R05 Cough: Secondary | ICD-10-CM | POA: Insufficient documentation

## 2014-01-07 DIAGNOSIS — R053 Chronic cough: Secondary | ICD-10-CM | POA: Insufficient documentation

## 2014-01-07 DIAGNOSIS — M792 Neuralgia and neuritis, unspecified: Secondary | ICD-10-CM | POA: Insufficient documentation

## 2014-01-07 NOTE — Assessment & Plan Note (Signed)
#  Cough  - due to IPF but could be due to accuretic too - I will talk to Dr Johnsie Cancel about changing this and get back to you

## 2014-01-07 NOTE — Assessment & Plan Note (Signed)
#  Right sided chest pain  - this is likely due to neuropathic pain  - try neurontin 100 or 300mg  once at bedtime (can make you foggy or sleepy); if you need script call us

## 2014-01-07 NOTE — Telephone Encounter (Signed)
Hi [name]  Patient has cough which is easily from his IPF but he is on ace inhibitor as well. Wondered if you could call in an alternative for him as a trial to se if if cough improves off ace inhibitor. This is for Dale [name]  Thanks  Dr. Brand Males, M.D., [facility].C.P Pulmonary and Critical Care Medicine Staff Physician [place] [facility] Pager: [phone], If no answer or between  15:00h - 7:00h: call [phone]  01/07/2014 3:56 PM

## 2014-01-07 NOTE — Assessment & Plan Note (Signed)
#  IPF  - clinically stable - not a transplant candidate due to heart issues; only option is trials  - not a ofev candidate due to heart issues  - glad you are tolerating pirfenidone; continue it  But make sure you use sunscreen - continue exercise  - continue weight loss; proud you are on track and losing weight  - last visit BMI was 31  - today  Body mass index is 29.59 kg/(m^2).  - goal BMI is 27 and below - for cough: ok to use nebulizer becuaes it helps  - do cbc, bmet., lft blood test today - return in 12 weeks with spirometry and office walk test (not 6 min walk test) at followup - will keep you posted on new trials forr IPF - we discussed this and appreciate your inter

## 2014-01-09 ENCOUNTER — Encounter: Payer: Self-pay | Admitting: Internal Medicine

## 2014-01-09 ENCOUNTER — Encounter: Payer: Self-pay | Admitting: Pulmonary Disease

## 2014-01-09 ENCOUNTER — Telehealth: Payer: Self-pay | Admitting: Internal Medicine

## 2014-01-09 DIAGNOSIS — R0989 Other specified symptoms and signs involving the circulatory and respiratory systems: Principal | ICD-10-CM

## 2014-01-09 DIAGNOSIS — J84112 Idiopathic pulmonary fibrosis: Secondary | ICD-10-CM

## 2014-01-09 DIAGNOSIS — R0609 Other forms of dyspnea: Secondary | ICD-10-CM

## 2014-01-09 NOTE — Telephone Encounter (Signed)
Yeah I told him to stop his nocturnal o2 given mild desats when tested. Ok to dc it. See my chart for the email string on this from triage

## 2014-01-09 NOTE — Telephone Encounter (Signed)
MR please advise if okay to send order to D/C O2. Thanks.

## 2014-01-09 NOTE — Telephone Encounter (Signed)
Please see next E-mail from patient-labs are in computer system and he has them.

## 2014-01-09 NOTE — Progress Notes (Signed)
Quick Note:  Called and spoke to pt's wife. Informed Mrs. Duda of results and recs per MR. Spouse verbalized understanding and denied any further questions at this time. Spouse aware results are being faxed to PCP. ______

## 2014-01-09 NOTE — Telephone Encounter (Signed)
This has been taken care of through patient e-mail dated 01-09-14. Nothing more needed at this time.

## 2014-01-09 NOTE — Telephone Encounter (Signed)
Brand Males, MD at 01/09/2014 3:02 PM     Status: Signed        Yeah I told him to stop his nocturnal o2 given mild desats when tested. Ok to dc it. See my chart for the email string on this from triage   I have placed order and sent e-mail to patient.

## 2014-01-10 NOTE — Telephone Encounter (Signed)
Spoke with patient's wife who states patient was off Quinapril last summer for 2 months and there was no change in patient's cough.  States there was no change in patient's cough during that time.  States patient diagnosed with interstitial lung disease October 2014 and she does not feel that the patient's cough is r/t the medication.  Wife states she just got a 90 day supply of Quinapril and cannot afford to switch medications at this time. Wife states that she will discuss with Dr. Johnsie Cancel at upcoming ov on 10/7 but for the time being she prefers patient continue on Quinapril.  Wife states patient's cough does seem to improve with Zyrtec, especially this time of year, however patient is not always compliant with taking it.  I advised patient I will send message to Drs. Johnsie Cancel and Ramaswamy for their awareness.

## 2014-01-10 NOTE — Telephone Encounter (Signed)
Will change to hyzaar 50/12.5 mg  Daily instead of the quinapril HCTZ Thanks  Pam / Altha Harm can  You call this in ?

## 2014-01-10 NOTE — Telephone Encounter (Signed)
Ok thanks for finding this out. Appreciateit. Let us leave as is. Thanks a lot

## 2014-02-15 ENCOUNTER — Encounter: Payer: Self-pay | Admitting: Cardiovascular Disease

## 2014-02-15 ENCOUNTER — Ambulatory Visit (INDEPENDENT_AMBULATORY_CARE_PROVIDER_SITE_OTHER): Payer: Medicare Other | Admitting: Cardiovascular Disease

## 2014-02-15 VITALS — BP 116/72 | HR 64 | Ht 68.0 in | Wt 190.0 lb

## 2014-02-15 DIAGNOSIS — M1990 Unspecified osteoarthritis, unspecified site: Secondary | ICD-10-CM

## 2014-02-15 DIAGNOSIS — Z952 Presence of prosthetic heart valve: Secondary | ICD-10-CM

## 2014-02-15 DIAGNOSIS — I10 Essential (primary) hypertension: Secondary | ICD-10-CM

## 2014-02-15 DIAGNOSIS — Z954 Presence of other heart-valve replacement: Secondary | ICD-10-CM

## 2014-02-15 DIAGNOSIS — J849 Interstitial pulmonary disease, unspecified: Secondary | ICD-10-CM

## 2014-02-15 DIAGNOSIS — I440 Atrioventricular block, first degree: Secondary | ICD-10-CM

## 2014-02-15 NOTE — Assessment & Plan Note (Signed)
Velcro crackles in lungs persist  F/U pulmonary no longer on oxygen continue study drug

## 2014-02-15 NOTE — Assessment & Plan Note (Signed)
Normal valve sounds  Continue anticoagulation  SBE prophylaxis

## 2014-02-15 NOTE — Progress Notes (Signed)
Patient ID: Dale [name], male   DOB: 09/12/1945, 68 y.o.   MRN: [id number] Dale [name] is seen today for F.U of AVR in 1996 he had a cath in 2009 with normal cors. Echo today reviewed  Study Conclusions  - Left ventricle: The cavity size was mildly dilated. Wall thickness was increased in a pattern of mild LVH. Systolic function was moderately to severely reduced. The estimated ejection fraction was in the range of 30% to 35%. Diffuse hypokinesis. Doppler parameters are consistent with abnormal left ventricular relaxation (grade 1 diastolic dysfunction). Doppler parameters are consistent with high ventricular filling pressure. - Aortic valve: A mechanical  was present. There was mild stenosis. Trivial regurgitation. - Ascending aorta: The ascending aorta was mildly dilated. - Mitral valve: Calcified annulus. Mildly thickened leaflets . Mild regurgitation. - Left atrium: The atrium was moderately dilated. - Right ventricle: Systolic function was mildly reduced. - Right atrium: The atrium was mildly to moderately dilated. Impressions:  - Compared to 03/15/13, LV function slightly worse; prosthetic aortic valve with normal gradients; MAC with thickened MV; possible very mild prolapse.  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  Has lost 20 lbs and stopped drinking Doing great at pulmonary rehab      ROS: Denies fever, malais, weight loss, blurry vision, decreased visual acuity, cough, sputum, SOB, hemoptysis, pleuritic pain, palpitaitons, heartburn, abdominal pain, melena, lower extremity edema, claudication, or rash.  All other systems reviewed and negative  General: Affect appropriate Healthy:  appears stated age 68: normal Neck supple with no adenopathy JVP normal no bruits  no thyromegaly Lungs interstitial crackles bilaterally no wheezing and good diaphragmatic motion Heart:  S0/F0  Click prominent with SEM  No AR  murmur, no rub, gallop or click PMI normal Abdomen: benighn, BS positve, no tenderness, no AAA no bruit.  No HSM or HJR Distal pulses intact with no bruits No edema Neuro non-focal Skin warm and dry No muscular weakness   Current Outpatient Prescriptions  Medication Sig Dispense Refill  . albuterol (VENTOLIN HFA) 108 (90 BASE) MCG/ACT inhaler Inhale 2 puffs into the lungs every 6 (six) hours as needed for wheezing.  1 Inhaler  6  . aspirin 81 MG tablet Take 81 mg by mouth daily.       Marland Kitchen atorvastatin (LIPITOR) 80 MG tablet Take 1 tablet (80 mg total) by mouth daily.  90 tablet  1  . celecoxib (CELEBREX) 200 MG capsule Take 1 capsule (200 mg total) by mouth daily.  90 capsule  1  . docusate sodium (COLACE) 100 MG capsule Take 200 mg by mouth daily.      . fenofibrate (TRICOR) 145 MG tablet Take 1 tablet (145 mg total) by mouth daily.  90 tablet  3  . multivitamin (THERAGRAN) per tablet Take 1 tablet by mouth daily.       . niacin (NIASPAN) 500 MG CR tablet Take 1 tablet (500 mg total) by mouth at bedtime.  90 tablet  0  . omeprazole (PRILOSEC) 20 MG capsule Take 1 capsule (20 mg total) by mouth daily.  30 capsule  11  . oxyCODONE-acetaminophen (PERCOCET/ROXICET) 5-325 MG per tablet Take 1-2 tablets by mouth every 4 (four) hours as needed for severe pain.  40 tablet  0  . Pirfenidone (ESBRIET) 267 MG CAPS Take 3  capsules by mouth 3 (three) times daily.      . quinapril-hydrochlorothiazide (ACCURETIC) 20-12.5 MG per tablet Take 1 tablet by mouth 2 (two) times daily.  180 tablet  1  . warfarin (COUMADIN) 5 MG tablet Take 1.5 tablets (7.5 mg total) by mouth daily. Take 7.5 MG DAILY TILL pt/inr NEXT CHECK  30 tablet  0   No current facility-administered medications for this visit.    Allergies  Penicillins  Electrocardiogram:  SR LAD  Poor R  wave progression  2014  Today SR rate 64  PR 276 no significant change   Assessment and Plan

## 2014-02-15 NOTE — Assessment & Plan Note (Signed)
Well controlled.  Continue current medications and low sodium Dash type diet.    

## 2014-02-15 NOTE — Assessment & Plan Note (Signed)
IVCD stable related to AV disease no high grade AV block PR mildly prolonged

## 2014-02-15 NOTE — Patient Instructions (Signed)
Your physician wants you to follow-up in:  6 MONTHS WITH DR NISHAN  You will receive a reminder letter in the mail two months in advance. If you don't receive a letter, please call our office to schedule the follow-up appointment. Your physician recommends that you continue on your current medications as directed. Please refer to the Current Medication list given to you today. 

## 2014-02-15 NOTE — Assessment & Plan Note (Signed)
Uses celebrex as it is the only thing that works We have discussed issues with cardiac risk in past and we agreed quality of life issues superseed

## 2014-03-21 ENCOUNTER — Other Ambulatory Visit: Payer: Self-pay | Admitting: Emergency Medicine

## 2014-03-21 MED ORDER — OMEPRAZOLE 20 MG PO CPDR: 20.0000 mg | DELAYED_RELEASE_CAPSULE | Freq: Every day | ORAL | Status: DC

## 2014-03-23 ENCOUNTER — Telehealth: Payer: Self-pay | Admitting: Internal Medicine

## 2014-03-23 NOTE — Telephone Encounter (Signed)
lmtcb for pt's wife, Kathy.  

## 2014-03-27 NOTE — Telephone Encounter (Signed)
Called and spoke with pts wife and she stated that he has been denied the grant for esbriet for 2016.  They do have insurance but they were told they would have to pay almost $3000 out of pocket for the first month and they just cannot afford this.  Will forward to MR to see if any other options are out there for the pt.  Please advise. Thanks  Allergies  Allergen Reactions  . Penicillins Swelling and Rash    Current Outpatient Prescriptions on File Prior to Visit  Medication Sig Dispense Refill  . albuterol (VENTOLIN HFA) 108 (90 BASE) MCG/ACT inhaler Inhale 2 puffs into the lungs every 6 (six) hours as needed for wheezing. 1 Inhaler 6  . aspirin 81 MG tablet Take 81 mg by mouth daily.     Marland Kitchen atorvastatin (LIPITOR) 80 MG tablet Take 1 tablet (80 mg total) by mouth daily. 90 tablet 1  . celecoxib (CELEBREX) 200 MG capsule Take 1 capsule (200 mg total) by mouth daily. 90 capsule 1  . docusate sodium (COLACE) 100 MG capsule Take 200 mg by mouth daily.    . fenofibrate (TRICOR) 145 MG tablet Take 1 tablet (145 mg total) by mouth daily. 90 tablet 3  . multivitamin (THERAGRAN) per tablet Take 1 tablet by mouth daily.     . niacin (NIASPAN) 500 MG CR tablet Take 1 tablet (500 mg total) by mouth at bedtime. 90 tablet 0  . omeprazole (PRILOSEC) 20 MG capsule Take 1 capsule (20 mg total) by mouth daily. 30 capsule 11  . oxyCODONE-acetaminophen (PERCOCET/ROXICET) 5-325 MG per tablet Take 1-2 tablets by mouth every 4 (four) hours as needed for severe pain. 40 tablet 0  . Pirfenidone (ESBRIET) 267 MG CAPS Take 3 capsules by mouth 3 (three) times daily.    . quinapril-hydrochlorothiazide (ACCURETIC) 20-12.5 MG per tablet Take 1 tablet by mouth 2 (two) times daily. 180 tablet 1  . warfarin (COUMADIN) 5 MG tablet Take 1.5 tablets (7.5 mg total) by mouth daily. Take 7.5 MG DAILY TILL pt/inr NEXT CHECK 30 tablet 0   No current facility-administered medications on file prior to visit.

## 2014-03-29 NOTE — Telephone Encounter (Signed)
The only openings are on 05/22/14, unless you are want to put him on 04/03/14, the new added day.

## 2014-03-29 NOTE — Telephone Encounter (Signed)
Pt aware that appt scheduled for 04/03/14 at 10am -- will call pt if time will not work with MR.  Will send to [name] to ensure that schedule gets opened for 11/23 and pt gets placed on this schedule for 10am

## 2014-03-29 NOTE — Telephone Encounter (Signed)
Please get him in 04/03/14 ; he needs 3 month ful for IPF with walk test on day of, LFT on day of, and office spirometry on day of visit anyways  Thanks  Dr. Brand Males, M.D., [facility].C.P Pulmonary and Critical Care Medicine Staff Physician [place] [facility] Pager: [phone], If no answer or between  15:00h - 7:00h: call [phone]  03/29/2014 10:27 AM

## 2014-03-29 NOTE — Telephone Encounter (Signed)
That is weirdI Will talk to the esbriet rep to see what we can do  Also, . I can get him into a study for IPF where we are using esbriet for 6 months in combo with another drug; no placebo. I was going to talk to him about it anyways. So, during these 6 months esbriet will be free  How soon can you get him in ? How is my appt schedule?  Let me know. Meanwhile,  I Will talk to the esbriet rep to see what we can do   Meanwhile  Thanks  Dr. Brand Males, M.D., [facility].C.P Pulmonary and Critical Care Medicine Staff Physician [place] [facility] Pager: [phone], If no answer or between  15:00h - 7:00h: call [phone]  03/29/2014 5:19 AM

## 2014-03-30 NOTE — Telephone Encounter (Signed)
Appt made for 11/23 at 10am. Nothing further needed.

## 2014-04-03 ENCOUNTER — Other Ambulatory Visit (INDEPENDENT_AMBULATORY_CARE_PROVIDER_SITE_OTHER): Payer: Medicare Other

## 2014-04-03 ENCOUNTER — Encounter: Payer: Self-pay | Admitting: Internal Medicine

## 2014-04-03 ENCOUNTER — Ambulatory Visit (INDEPENDENT_AMBULATORY_CARE_PROVIDER_SITE_OTHER): Payer: Medicare Other | Admitting: Internal Medicine

## 2014-04-03 VITALS — BP 118/80 | HR 65 | Ht 67.0 in | Wt 193.0 lb

## 2014-04-03 DIAGNOSIS — J84112 Idiopathic pulmonary fibrosis: Secondary | ICD-10-CM

## 2014-04-03 DIAGNOSIS — Z5181 Encounter for therapeutic drug level monitoring: Secondary | ICD-10-CM | POA: Diagnosis not present

## 2014-04-03 DIAGNOSIS — R053 Chronic cough: Secondary | ICD-10-CM

## 2014-04-03 DIAGNOSIS — R05 Cough: Secondary | ICD-10-CM | POA: Diagnosis not present

## 2014-04-03 LAB — HEPATIC FUNCTION PANEL
ALT: 24 U/L (ref 0–53)
AST: 32 U/L (ref 0–37)
Albumin: 4.3 g/dL (ref 3.5–5.2)
Alkaline Phosphatase: 61 U/L (ref 39–117)
Bilirubin, Direct: 0.1 mg/dL (ref 0.0–0.3)
TOTAL PROTEIN: 7.5 g/dL (ref 6.0–8.3)
Total Bilirubin: 0.8 mg/dL (ref 0.2–1.2)

## 2014-04-03 MED ORDER — PREDNISONE 10 MG PO TABS: ORAL_TABLET | ORAL | Status: DC

## 2014-04-03 NOTE — Patient Instructions (Addendum)
#  IPF Flare up  - clinically might be in some flare up - Please take Take prednisone 40mg  once daily x 3 days, then 30mg  once daily x 3 days, then 20mg  once daily x 3 days, then prednisone 10mg  once daily  x 3 days and stop  #IPF Chronic disease - might be progressive. Will need to confirm this on on full PFT -  glad you are tolerating pirfenidone; continue it  But make sure you use sunscreen - Nurse will red0 prescritipion - if you are having problems with copay again let us know - continue prilosec daily - continue exercise - do, lft blood test today - return in 12 weeks with full PFT and office walk test (not 6 min walk test) at followup - will keep you posted on new trials forr IPF - we discussed this and appreciate your interest   #Cough  - due to IPF but could be due to accuretic too.  PIease talk to Dr Johnsie Cancel about changing this: I know you do not think this is causing cough to get worse - Try advair disc 1 puff bid prn empiric for cough - let us see if the prednisone burst helps you for cough  - at followup can discuss cough trial research medcication for cough in IPF  #FOllowup FUll PFT Jan 11th,  2016 visit with June Leap Keep Jan 2016 appt with me Future Appointments Date Time Provider [place]  05/22/2014 2:15 PM Brand Males, MD [facility] None

## 2014-04-03 NOTE — Progress Notes (Signed)
Subjective:    Patient ID: Dale [name], male    DOB: 12-Jun-1945, 68 y.o.   MRN: [id number]  HPI   HPI  OV 08/11/2013 Chief Complaint  Patient presents with  . Follow-up    Pt states he has been on esbriet for 4 weeks. Pt states when he first takes the  first dose he gets some fatigue that will last about an hour and then improves.   Followup biopsy proven idiopathic pulmonary fibrosis-IPF He is now on pirfenidone Pirfenidone for the last 1 month. 2 significant delay in him starting Pirfenidone due to insurance issues and autistic issues and work for issues both at our office and through [place] . Currently he is on it for one month. He is taking full dose 3 tablets 3 times a day. He is tolerating it just well. Early in the morning he has some fatigue that lasts one hour after Pirfenidone but this resolves. He does not want to make any changes to his regimen. He is attending pulmonary rehabilitation and is doing really well. Dyspnea stable to improved. He has intentionally lost weight. His BMI is now 31 Still have not discussed transplant. He is due for liver function test today and CBC as part of his monitoring   09/08/2013 Acute OV  Complains of sensation in chest when eating x 2 weeks.  Wants to make sure it is not related to Pirfenidone.  Does have intermittent heartburn, controlled on Prilosec.  Dyspnea and cough are unchanged.  Denies any choking, increased reflux, difficulty swallowing food. Has some nasal drip and drainage. Something draining in throat .  Complains of some right sided rib pain along the previous biopsy incision site .  No fever, hemoptysis, edema , increased dyspnea, orthopnea.  On ACE .  In pulmonary rehab, doing well but works very hard, and is sore a lot    OV 01/04/2014  Chief Complaint  Patient presents with  . Follow-up    Pt c/o DOE. Pt c/o prod cough with white mucous x 2 weeks and c/o sharp pain under right breast, "like someone is shocking me".       FU IPF: On Esbriet Rx since March 2015   - exercise tolerance is same. Has lost weight due to rehab and exercise and feeling better. Not using nocturnal o2; wants to rreturn it. Appareitnly only transient desats in rehab. Tolerating esbriet well except for transient post prandial fatigue. NO GI side effects. wEaring sun screen diligently.  Spirometry x fvc 2.47L/54%, One attempt only due to cough (2.6L in oct 2014 but today's attempt is poor quality) but if numbers are true then he has progressed but I doubt numbers can be trusted.  He is not a transplant candidate due to heart issues. Past 2 weeks cough worse - mild/moderat ewith some white sputum but no other ae-ipf symptims. He has not had LFT since April 2015    - new issues: right moderate chest pain at site of scar for biiopsy. Middle to back. Occurs only on local pressure and absent othwerise. Hyperesthetic in quality. Has been told by CVTS that is related to biopsy. Wife wants Rx for this   - Health : wants his lipid check today so he can consolidate blood draws. He has fasted   OV  04/03/2014  Chief Complaint  Patient presents with  . Follow-up    Pt productive cough with white mucus and DOE. Pt is using his albuterol hfa more frequently. Pt c/o chest  soreness d/t cough.    IPF fu  - on esbriet since march 2015. Unlikely transplant and OFEVandidate due to cardiac issues and    3 month follow-up for idiopathic pulmonary fibrosis. He presents as always with his wife. Overall he and his wife think that he is stable but he is always bothered by his baseline cough which is rated as a 4 out of 10 are moderate. But in the last 1 week it is increased to 8 out of 10 and his severe. It is associated with some white mucous and some increased wheezing. Albuterol that gives relief. Does not help as much now. In terms of his dyspnea he is stable. However spirometry today shows a decline in lung function. He has not yet had his LFTs for  today.  His other main concern is that his co-pay is high. I spoke to the pharmaceutical rep for [employer] and he is advised doing patient's insurance form for drug approval all over again because of the start of new year  2016   #PFT DATA Pulmonary function test 03/07/2013 - Shows moderate restriction -FVC 2.6 L/63%. FEV1 2.2 L/71%. Ratio of 84. Total lung capacity 3.8 L/58%. Patient was unable to perform DLCO.  SPiro Aug 2015  - FVC 2.47L/54%  Spirometry today 04/03/2014 - FVC 2.28 L/58%, FEV1 1.8 L/56%, ratio of 79/101%   #Walk test  04/03/2014: 185 feet x 3 laps: on RA - did not desaturate   Review of Systems  Constitutional: Negative for fever and unexpected weight change.  HENT: Negative for congestion, dental problem, ear pain, nosebleeds, postnasal drip, rhinorrhea, sinus pressure, sneezing, sore throat and trouble swallowing.   Eyes: Negative for redness and itching.  Respiratory: Positive for cough and shortness of breath. Negative for chest tightness and wheezing.   Cardiovascular: Negative for palpitations and leg swelling.  Gastrointestinal: Negative for nausea and vomiting.  Genitourinary: Negative for dysuria.  Musculoskeletal: Negative for joint swelling.  Skin: Negative for rash.  Neurological: Negative for headaches.  Hematological: Does not bruise/bleed easily.  Psychiatric/Behavioral: Negative for dysphoric mood. The patient is not nervous/anxious.    Current outpatient prescriptions: albuterol (VENTOLIN HFA) 108 (90 BASE) MCG/ACT inhaler, Inhale 2 puffs into the lungs every 6 (six) hours as needed for wheezing., Disp: 1 Inhaler, Rfl: 6;  aspirin 81 MG tablet, Take 81 mg by mouth daily. , Disp: , Rfl: ;  atorvastatin (LIPITOR) 80 MG tablet, Take 1 tablet (80 mg total) by mouth daily., Disp: 90 tablet, Rfl: 1 celecoxib (CELEBREX) 200 MG capsule, Take 1 capsule (200 mg total) by mouth daily., Disp: 90 capsule, Rfl: 1;  docusate sodium (COLACE) 100 MG  capsule, Take 200 mg by mouth daily., Disp: , Rfl: ;  fenofibrate (TRICOR) 145 MG tablet, Take 1 tablet (145 mg total) by mouth daily., Disp: 90 tablet, Rfl: 3;  multivitamin (THERAGRAN) per tablet, Take 1 tablet by mouth daily. , Disp: , Rfl:  niacin (NIASPAN) 500 MG CR tablet, Take 1 tablet (500 mg total) by mouth at bedtime., Disp: 90 tablet, Rfl: 0;  omeprazole (PRILOSEC) 20 MG capsule, Take 1 capsule (20 mg total) by mouth daily., Disp: 30 capsule, Rfl: 11;  oxyCODONE-acetaminophen (PERCOCET/ROXICET) 5-325 MG per tablet, Take 1-2 tablets by mouth every 4 (four) hours as needed for severe pain., Disp: 40 tablet, Rfl: 0 Pirfenidone (ESBRIET) 267 MG CAPS, Take 3 capsules by mouth 3 (three) times daily., Disp: , Rfl: ;  quinapril-hydrochlorothiazide (ACCURETIC) 20-12.5 MG per tablet, Take 1 tablet by  mouth 2 (two) times daily., Disp: 180 tablet, Rfl: 1;  warfarin (COUMADIN) 5 MG tablet, Take 1.5 tablets (7.5 mg total) by mouth daily. Take 7.5 MG DAILY TILL pt/inr NEXT CHECK, Disp: 30 tablet, Rfl: 0     Objective:   Physical Exam  Filed Vitals:   04/03/14 1005  BP: 118/80  Pulse: 65  Height: 5\' 7"  (1.702 m)  Weight: 87.544 kg (193 lb)  SpO2: 95%   Body mass index is 30.22 kg/(m^2).   GEN: A/Ox3; pleasant , NAD,. COUGHS +  HEENT:  [place]/AT,  EACs-clear, TMs-wnl, NOSE-clear, THROAT-clear, no lesions, no postnasal drip or exudate noted.   NECK:  Supple w/ fair ROM; no JVD; normal carotid impulses w/o bruits; no thyromegaly or nodules palpated; no lymphadenopathy.  RESP  Bibasilar crackles noted no accessory muscle use, no dullness to percussion  CARD:  RRR, no m/r/g  ,tr peripheral edema, pulses intact, no cyanosis or clubbing.  GI:   Soft & nt; nml bowel sounds; no organomegaly or masses detected.  Musco: Warm bil, no deformities or joint swelling noted.   Neuro: alert, no focal deficits noted.         Assessment & Plan:     ICD-9-CM ICD-10-CM   1. Chronic cough 786.2 R05   2.  IPF (idiopathic pulmonary fibrosis) 516.31 J84.112 Spirometry with Graph  3. Medication monitoring encounter V58.83 Z51.81    #IPF Flare up  - clinically might be in some flare up - Please take Take prednisone 40mg  once daily x 3 days, then 30mg  once daily x 3 days, then 20mg  once daily x 3 days, then prednisone 10mg  once daily  x 3 days and stop  #IPF Chronic disease - - might be progressive. Will need to confirm this on on full PFT -  glad you are tolerating pirfenidone; continue it  But make sure you use sunscreen - Nurse will red0 prescritipion - if you are having problems with copay again let us know - continue prilosec daily - continue exercise - do, lft blood test today -  full PFT and office walk test (not 6 min walk test) at followup in 12 weeks to rule out/confirm progression - will keep you posted on new trials forr IPF - we discussed this and appreciate your interest   #Cough  - due to IPF but could be due to accuretic too.  PIease talk to Dr Johnsie Cancel about changing this: I know you do not think this is causing cough to get worse - Try advair disc 1 puff bid prn empiric for cough - let us see if the prednisone burst helps you for cough  - at followup can discuss cough trial research medcication for cough in IPF  #FOllowup FUll PFT Jan 11th,  2016 visit with June Leap Keep Jan 2016 appt with me Future Appointments Date Time Provider [place]  05/22/2014 2:15 PM Brand Males, MD [facility] None    > 50% of this > 25 min visit spent in face to face counseling (15 min visit converted to 25 min)    Dr. Brand Males, M.D., [facility].C.P Pulmonary and Critical Care Medicine Staff Physician [place] [facility] Pager: [phone], If no answer or between  15:00h - 7:00h: call [phone]  04/03/2014 10:51 AM

## 2014-04-04 DIAGNOSIS — Z5181 Encounter for therapeutic drug level monitoring: Secondary | ICD-10-CM | POA: Insufficient documentation

## 2014-04-04 NOTE — Progress Notes (Signed)
Quick Note:  Called and spoke to pt's wife. Informed pt's wife of the results per MR. Spouse verbalized understanding and denied any further questions or concerns at this time. ______

## 2014-04-14 ENCOUNTER — Telehealth: Payer: Self-pay | Admitting: Internal Medicine

## 2014-04-14 NOTE — Telephone Encounter (Signed)
Corrected form refaxed to [employer] at [phone]. Will await response.

## 2014-04-14 NOTE — Telephone Encounter (Signed)
Spoke with Dale [name] at [facility] about this pt's Esbriet enrollment- ID # for pt is [id number].  States that on [employer]priet enrollment form they need: 1. A service requested in top L box 2. The Dr's last name is crossed out in bottom right "prescriber information" box, needs to be initialed by person who crossed it out and refaxed.  Requested the form to be faxed to [place] at front fax number, attn [name].  Will forward to [place] to follow up on.

## 2014-04-18 NOTE — Telephone Encounter (Signed)
[phone] returning a call

## 2014-04-18 NOTE — Telephone Encounter (Signed)
Spoke with patients wife-they will come by the office tomorrow to sign form(forms at front per [facility]).

## 2014-04-18 NOTE — Telephone Encounter (Signed)
lmtcb for pt. Will need pt to come to office to sign PAN form for Esbriet.

## 2014-04-19 NOTE — Telephone Encounter (Signed)
PAN form faxed [employer]. Will await decision.

## 2014-04-19 NOTE — Telephone Encounter (Signed)
Forms signed by patient and given to [place].

## 2014-04-20 ENCOUNTER — Other Ambulatory Visit: Payer: Self-pay | Admitting: Internal Medicine

## 2014-04-21 ENCOUNTER — Encounter: Payer: Self-pay | Admitting: Cardiovascular Disease

## 2014-04-21 ENCOUNTER — Telehealth: Payer: Self-pay | Admitting: Internal Medicine

## 2014-04-21 NOTE — Telephone Encounter (Signed)
Called and spoke to pt. Pt c/o prod cough with white and clear mucus, increase in SOB- both cough and SOB are exacerbated when lying down, pt also c/o chest soreness from cough. S/s x weeks. Pt took mucinex and pred from last OV and stated it did not help much. Pt last seen by MR on 04/03/14.  MR please advise.  Allergies  Allergen Reactions  . Penicillins Swelling and Rash

## 2014-04-21 NOTE — Telephone Encounter (Signed)
Called and spoke to pt's wife, Juliann Pulse. Informed Juliann Pulse of the recs per MR. Juliann Pulse requested I send her a MyChart message of exactly what MR stated. Message sent to pt. Appt made 04/25/14 with TP.  Juliann Pulse verbalized all understanding and denied any further questions or concerns at this time.

## 2014-04-21 NOTE — Telephone Encounter (Signed)
Bring him in today to see someone or early next week  I plan to open office - will let Maryann Conners know  Regardless of what he and wife say anymore  - he needs to come off ace inhibitor - accuretic; they need to talk to Dr Johnsie Cancel about alternative  - double up his omeprazole to 40mg  once a day at [place] 30 min before food  - at night take ranitidine OTC 300mg  (Cheapest) at [facility] - next month will put him on cough for IPF study medication  Can he see [name] Parrett 04/21/2014 or me next week ? (will open office)  Dr. Brand Males, M.D., [facility].C.P Pulmonary and Critical Care Medicine Staff Physician [place] [facility] Pager: [phone], If no answer or between  15:00h - 7:00h: call [phone]  04/21/2014 11:01 AM

## 2014-04-23 ENCOUNTER — Telehealth: Payer: Self-pay | Admitting: Internal Medicine

## 2014-04-23 NOTE — Telephone Encounter (Signed)
[name]  You are seeing him 04/25/14 for worsening cough in seting of IPF. They were relecutant to come off ace inhibitor because prior trial had not worked. Still pred burst recently did not work. I think now  A) he should definitely come off ace inhibitor B) step up gerd rx C) recommend you consider getting a HRCT wo contrast with only Dr Rosario Jacks or [place] to read  - last was a year ago D) consider Sinus CT and step up sinus Rx E) Jan 2016 will start a new cough in IPF study protocol where he willget randomized to drug -   Thanks  Dr. Brand Males, M.D., [facility].C.P Pulmonary and Critical Care Medicine Staff Physician [place] [facility] Pager: [phone], If no answer or between  15:00h - 7:00h: call [phone]  04/23/2014 10:15 AM

## 2014-04-25 ENCOUNTER — Telehealth: Payer: Self-pay | Admitting: *Deleted

## 2014-04-25 ENCOUNTER — Ambulatory Visit (INDEPENDENT_AMBULATORY_CARE_PROVIDER_SITE_OTHER): Payer: Medicare Other | Admitting: Adult Health

## 2014-04-25 ENCOUNTER — Encounter: Payer: Self-pay | Admitting: Adult Health

## 2014-04-25 VITALS — BP 120/70 | HR 75 | Temp 97.6°F | Ht 67.0 in | Wt 190.0 lb

## 2014-04-25 DIAGNOSIS — J84112 Idiopathic pulmonary fibrosis: Secondary | ICD-10-CM

## 2014-04-25 MED ORDER — LOSARTAN POTASSIUM-HCTZ 50-12.5 MG PO TABS: 1.0000 | ORAL_TABLET | Freq: Every day | ORAL | Status: DC

## 2014-04-25 MED ORDER — ATORVASTATIN CALCIUM 80 MG PO TABS: 80.0000 mg | ORAL_TABLET | Freq: Every day | ORAL | Status: AC

## 2014-04-25 MED ORDER — NIACIN ER (ANTIHYPERLIPIDEMIC) 500 MG PO TBCR: 500.0000 mg | EXTENDED_RELEASE_TABLET | Freq: Every day | ORAL | Status: DC

## 2014-04-25 MED ORDER — FENOFIBRATE 145 MG PO TABS: 145.0000 mg | ORAL_TABLET | Freq: Every day | ORAL | Status: DC

## 2014-04-25 MED ORDER — CELECOXIB 200 MG PO CAPS: 200.0000 mg | ORAL_CAPSULE | Freq: Every day | ORAL | Status: DC

## 2014-04-25 NOTE — Patient Instructions (Addendum)
Begin Delsym 2 teaspoons twice daily for cough Begin losartan HCTZ daily as recommended per cardiology Remain off of your ACE inhibitor, quinapril Use Prilosec 40 mg daily in the morning  Continue on Zantac 150 mg at bedtime Continue on Zyrtec 10 mg in the morning Begin Chlorpheneramine 4mg  2 tabs at bedtime Saline nasal rinses as needed We are setting, you up for a CT scan of Lungs  GERD diet Sugarless candy and sips of water to avoid throat clearing and cough Avoid all mint products Follow up Dr. Chase Caller in 4 weeks and As needed   Please contact office for sooner follow up if symptoms do not improve or worsen or seek emergency care

## 2014-04-25 NOTE — Progress Notes (Signed)
Subjective:    Patient ID: Dale [name], male    DOB: 1945-11-27, 68 y.o.   MRN: [id number]  HPI   HPI  OV 08/11/2013 Chief Complaint  Patient presents with  . Follow-up    Pt states he has been on esbriet for 4 weeks. Pt states when he first takes the  first dose he gets some fatigue that will last about an hour and then improves.   Followup biopsy proven idiopathic pulmonary fibrosis-IPF He is now on pirfenidone Pirfenidone for the last 1 month. 2 significant delay in him starting Pirfenidone due to insurance issues and autistic issues and work for issues both at our office and through [place] . Currently he is on it for one month. He is taking full dose 3 tablets 3 times a day. He is tolerating it just well. Early in the morning he has some fatigue that lasts one hour after Pirfenidone but this resolves. He does not want to make any changes to his regimen. He is attending pulmonary rehabilitation and is doing really well. Dyspnea stable to improved. He has intentionally lost weight. His BMI is now 31 Still have not discussed transplant. He is due for liver function test today and CBC as part of his monitoring   09/08/2013 Acute OV  Complains of sensation in chest when eating x 2 weeks.  Wants to make sure it is not related to Pirfenidone.  Does have intermittent heartburn, controlled on Prilosec.  Dyspnea and cough are unchanged.  Denies any choking, increased reflux, difficulty swallowing food. Has some nasal drip and drainage. Something draining in throat .  Complains of some right sided rib pain along the previous biopsy incision site .  No fever, hemoptysis, edema , increased dyspnea, orthopnea.  On ACE .  In pulmonary rehab, doing well but works very hard, and is sore a lot    OV 01/04/2014  Chief Complaint  Patient presents with  . Follow-up    Pt c/o DOE. Pt c/o prod cough with white mucous x 2 weeks and c/o sharp pain under right breast, "like someone is shocking me".       FU IPF: On Esbriet Rx since March 2015   - exercise tolerance is same. Has lost weight due to rehab and exercise and feeling better. Not using nocturnal o2; wants to rreturn it. Appareitnly only transient desats in rehab. Tolerating esbriet well except for transient post prandial fatigue. NO GI side effects. wEaring sun screen diligently.  Spirometry x fvc 2.47L/54%, One attempt only due to cough (2.6L in oct 2014 but today's attempt is poor quality) but if numbers are true then he has progressed but I doubt numbers can be trusted.  He is not a transplant candidate due to heart issues. Past 2 weeks cough worse - mild/moderat ewith some white sputum but no other ae-ipf symptims. He has not had LFT since April 2015    - new issues: right moderate chest pain at site of scar for biiopsy. Middle to back. Occurs only on local pressure and absent othwerise. Hyperesthetic in quality. Has been told by CVTS that is related to biopsy. Wife wants Rx for this   - Health : wants his lipid check today so he can consolidate blood draws. He has fasted   OV  04/03/2014  Chief Complaint  Patient presents with  . Follow-up    Pt productive cough with white mucus and DOE. Pt is using his albuterol hfa more frequently. Pt c/o chest  soreness d/t cough.    IPF fu  - on esbriet since march 2015. Unlikely transplant and OFEVandidate due to cardiac issues and    3 month follow-up for idiopathic pulmonary fibrosis. He presents as always with his wife. Overall he and his wife think that he is stable but he is always bothered by his baseline cough which is rated as a 4 out of 10 are moderate. But in the last 1 week it is increased to 8 out of 10 and his severe. It is associated with some white mucous and some increased wheezing. Albuterol that gives relief. Does not help as much now. In terms of his dyspnea he is stable. However spirometry today shows a decline in lung function. He has not yet had his LFTs for  today.  His other main concern is that his co-pay is high. I spoke to the pharmaceutical rep for [employer] and he is advised doing patient's insurance form for drug approval all over again because of the start of new year  2016   #PFT DATA Pulmonary function test 03/07/2013 - Shows moderate restriction -FVC 2.6 L/63%. FEV1 2.2 L/71%. Ratio of 84. Total lung capacity 3.8 L/58%. Patient was unable to perform DLCO.  SPiro Aug 2015  - FVC 2.47L/54%  Spirometry today 04/03/2014 - FVC 2.28 L/58%, FEV1 1.8 L/56%, ratio of 79/101%   #Walk test  04/03/2014: 185 feet x 3 laps: on RA - did not desaturate  04/25/2014 Acute OV (IPF /Cough)  Returns for persistent cough .  Pt has IPF on Esbriet .  Complains of cough occasionally producing clear/yellow, head congestion w/ clear drainage, PND, increased SOB, wheezing, tightness . Was treated with steroid taper last ov. Given Advair.  And Prilosec was increased 40mg  daily in am and Zantac 150mg  At bedtime  .  He did not see any improvement.  He was on an ACE inhibitor changed to ARB today .  Has post nasal drainage.  Using mucinex without help.  Cough is keeping him at night.  No fever or hemoptysis .  Recent dx with olecranon bursitis , rx Keflex per PCP .  Uses percocet to help with cough .    Review of Systems  Constitutional: Negative for fever and unexpected weight change.  HENT: Negative for congestion, dental problem, ear pain, nosebleeds, postnasal drip, rhinorrhea, sinus pressure, sneezing, sore throat and trouble swallowing.   Eyes: Negative for redness and itching.  Respiratory: Positive for cough and shortness of breath.  Cardiovascular: Negative for palpitations and leg swelling.  Gastrointestinal: Negative for nausea and vomiting.  Genitourinary: Negative for dysuria.  Musculoskeletal: Negative for joint swelling.  Skin: Negative for rash.  Neurological: Negative for headaches.  Hematological: Does not bruise/bleed  easily.  Psychiatric/Behavioral: Negative for dysphoric mood. The patient is not nervous/anxious.        Objective:   Physical Exam  GEN: A/Ox3; pleasant , NAD,. COUGHS +  HEENT:  [place]/AT,  EACs-clear, TMs-wnl, NOSE-clear, THROAT-clear, no lesions, no postnasal drip or exudate noted.   NECK:  Supple w/ fair ROM; no JVD; normal carotid impulses w/o bruits; no thyromegaly or nodules palpated; no lymphadenopathy.  RESP  Bibasilar crackles noted no accessory muscle use, no dullness to percussion  CARD:  RRR, no m/r/g  ,tr peripheral edema, pulses intact, no cyanosis or clubbing.  GI:   Soft & nt; nml bowel sounds; no organomegaly or masses detected.  Musco: Warm bil, no deformities or joint swelling noted.   Neuro: alert, no  focal deficits noted.         Assessment & Plan:

## 2014-04-25 NOTE — Telephone Encounter (Signed)
Cathleen from [employer]. Pt was approved for grant for copay assistance. Pharmacy is coordinating first shipment.

## 2014-04-25 NOTE — Telephone Encounter (Signed)
Received approval of Esbriet via fax. Called and spoke to pt's wife, Juliann Pulse. Informed Juliann Pulse of the approval of Esbriet but are waiting on the co-pay assistance. Called and spoke to [place] and was advised pt is still pending co-pay assistance approval and there are several assistance foundations that if pt is denied coverage then other resources are available. [employer] rep stated she will call pt to discuss status. Will call [employer] back to check on status of co-pay approval.

## 2014-04-25 NOTE — Telephone Encounter (Signed)
Sharlett Iles D >','<< Less Detail',event)" href="javascript:;">More Detail >>   Josue Hector, MD   Sent: Mon April 24, 2014 10:47 AM    To: Richmond Campbell, LPN; Guinevere Ferrari        Message     D/C quinapril HCTZ and call in hzaar 50/12.5 mg daily       [name]  04/21/2014  Patient Email  MRN:  [id number]   Description: 68 year old male  Provider: Josue Hector, MD  Department: [facility]         Encounter Messages     Read Composed From To Subject    Y 04/24/2014 7:13 PM Anselmo Rod, MD RE: Non-Urgent Medical Question    Y 04/24/2014 10:47 AM Josue Hector, MD [name] RE: Non-Urgent Medical Question    Y 04/21/2014 12:54 PM Anselmo Rod, MD Non-Urgent Medical Question        Encounter Diagnoses and Associated Orders     There are no diagnoses or orders entered for this encounter.       Notes     No notes of this type exist for this encounter.       Current View: Showing all answers Show Only Relevant Answers   Legend: Scores, Non-relevant Questions  Questionnaire Answers       No questionnaire available.                  Patient Demographics     Patient Name Sex DOB SSN Address Phone    Dale [name], Dale [name] Male 1945/12/31 [id number] [place] [address] [phone] [facility]) [phone] (Work) [phone] (Mobile) *Preferred*      PT'S  WIFE  AWARE OF  MED  CHANGE  .Adonis Housekeeper

## 2014-04-25 NOTE — Assessment & Plan Note (Addendum)
?   Flare with cyclical cough  Suspect ACE inhibitor is congtributing to cough flare  (Appreciate cardiology changing this to ARB, probable take ~6 weeks for cough improvement )  Will control for triggers and cough , -AR /GERD  Will check CT- HRCT to r/o worsening IPF  No improvement with steroids and Advair  Advised on narcoitic use addiction potential   Plan  Begin Delsym 2 teaspoons twice daily for cough Begin losartan HCTZ daily as recommended per cardiology Remain off of your ACE inhibitor, quinapril Use Prilosec 40 mg daily in the morning  Continue on Zantac 150 mg at bedtime Continue on Zyrtec 10 mg in the morning Begin Chlorpheneramine 4mg  2 tabs at bedtime Saline nasal rinses as needed We are setting, you up for a CT scan of Lungs  GERD diet Sugarless candy and sips of water to avoid throat clearing and cough Avoid all mint products Follow up Dr. Chase Caller in 4 weeks and As needed   Please contact office for sooner follow up if symptoms do not improve or worsen or seek emergency care

## 2014-04-26 NOTE — Telephone Encounter (Signed)
Pt aware of status of Esbriet. Nothing further needed at this time.

## 2014-05-02 ENCOUNTER — Ambulatory Visit (INDEPENDENT_AMBULATORY_CARE_PROVIDER_SITE_OTHER)
Admission: RE | Admit: 2014-05-02 | Discharge: 2014-05-02 | Disposition: A | Discharge status: Home / Self Care | Payer: Medicare Other | Source: Ambulatory Visit | Attending: Adult Health | Admitting: Adult Health

## 2014-05-02 DIAGNOSIS — J84112 Idiopathic pulmonary fibrosis: Secondary | ICD-10-CM

## 2014-05-03 NOTE — Progress Notes (Signed)
Quick Note:  LMOM TCB x1. ______ 

## 2014-05-04 ENCOUNTER — Telehealth: Payer: Self-pay | Admitting: Internal Medicine

## 2014-05-04 NOTE — Progress Notes (Signed)
Quick Note:  Called and spoke to pt's wife. Informed pt's wife of the results per TP. Pt's wife verbalized understanding and denied any further questions or concerns at this time. ______

## 2014-05-04 NOTE — Telephone Encounter (Signed)
Notes Recorded by Rinaldo Ratel, CMA on 05/03/2014 at 2:39 PM LMOM TCB x1. ------  Notes Recorded by Melvenia Needles, NP on 05/02/2014 at 4:08 PM Scarring is similar to last CT  Adenopathy has progressed -will discuss in more detail on return on 05/22/13  TAA -is slightly larger 5.0>5.1 cm  Will need to seen to PCP or cardiologist that is following this as will need serial follow up  Please contact office for sooner follow up if symptoms do not improve or worsen or seek emergency care    Called and spoke to pt's wife. Informed pt's wife of the results per TP. Pt's wife verbalized understanding and denied any further questions or concerns at this time.

## 2014-05-18 ENCOUNTER — Telehealth: Payer: Self-pay | Admitting: Internal Medicine

## 2014-05-18 NOTE — Telephone Encounter (Signed)
Dale [name]  I am seeing him 05/22/14 for his IPF in the PM. Can you please ensure that he has full PFT on or before that visit. His address  is [address] [place] [address]  - so if he wants to do it at [facility] is fine but I would like it . I think his IPF might be getting worse  Dr. Brand Males, M.D., [facility].C.P Pulmonary and Critical Care Medicine Staff Physician [place] [facility] Pager: [phone], If no answer or between  15:00h - 7:00h: call [phone]  05/18/2014 8:22 AM

## 2014-05-19 NOTE — Telephone Encounter (Signed)
Ok just do spirometry and walk test in office when he comes  Dr. Brand Males, M.D., [facility].C.P Pulmonary and Critical Care Medicine Staff Physician [place] [facility] Pager: [phone], If no answer or between  15:00h - 7:00h: call [phone]  05/19/2014 10:31 AM

## 2014-05-19 NOTE — Telephone Encounter (Signed)
Called and spoke to [name]. There is no availability at [facility], [facility] or [facility] for PFT's before the pt's appt. There isn't any availability in our office with June Leap either.  MR please advise.

## 2014-05-19 NOTE — Telephone Encounter (Signed)
Noted Will sign off 

## 2014-05-22 ENCOUNTER — Encounter: Payer: Self-pay | Admitting: Internal Medicine

## 2014-05-22 ENCOUNTER — Encounter: Payer: Self-pay | Admitting: Cardiovascular Disease

## 2014-05-22 ENCOUNTER — Ambulatory Visit (INDEPENDENT_AMBULATORY_CARE_PROVIDER_SITE_OTHER): Payer: Medicare Other | Admitting: Internal Medicine

## 2014-05-22 VITALS — BP 122/88 | HR 86 | Ht 67.0 in | Wt 195.0 lb

## 2014-05-22 DIAGNOSIS — E669 Obesity, unspecified: Secondary | ICD-10-CM

## 2014-05-22 DIAGNOSIS — R053 Chronic cough: Secondary | ICD-10-CM

## 2014-05-22 DIAGNOSIS — J84112 Idiopathic pulmonary fibrosis: Secondary | ICD-10-CM

## 2014-05-22 DIAGNOSIS — R05 Cough: Secondary | ICD-10-CM

## 2014-05-22 MED ORDER — GABAPENTIN 300 MG PO CAPS: ORAL_CAPSULE | ORAL | Status: DC

## 2014-05-22 NOTE — Progress Notes (Signed)
Subjective:    Patient ID: Dale [name], male    DOB: 1946-02-29, 68 y.o.   MRN: [id number]  HPI   #IPF   -biopsy proven - Nov 2014 UIP   #Progress PFT FVC fev1 ratio BD fev1 TLC DLCO Walk test 185 feet x 3 laps CT Rx  Bx  Oct 2014/Nov 2014 2.6L       Possible UIP  UIP nov 2014  March 2015 x      ono - desat but he returned o2  Esbriet + Rehab + No Ofev due to anticoag   Aug 2015 2.47L/54% cough        esbiet+ Increased cough  Oct 2015 2.6 L/63% 2.2 L/71%   3.8 L/58% Could not do      Nov/dec 2015  2.28 L/58% 1.8 L/56% 79/101%    Did not desat Slight progression esbruet + Worsening cough. Dc ace inhjbitor  Jan 2016       Desat +  2nd lap  esbriet + Severe cough worse, more dyspenic       OV 05/22/2014  Chief Complaint  Patient presents with  . Follow-up    Pt c/o increase in SOB, prod cough with white and clear mucus and chest pain secondary to cough.     Follow-up idiopathic pulmonary fibrosis  on esbriet since march 2015. Unlikely OFEV candidate due to  anticoagulation   He presents as before with his wife. He continues to report worsening cough. He did see my nurse partition around Christmas 2015 and multiple interventions were made around sinus and acid reflux treatment but this has not helped cough. Cough is rated as severe. Coughing spells bring about dyspnea. During coughing spells wife notices that he does not desaturate. Cough is rated as 8 out of 10 in severity. It is dry. It is really affecting his quality of life. In fact cough is worse since he last saw me a few months ago. In association dyspnea is also worse and class II-III in level relieved by rest. No associated chest pain. He is very anxious about his symptoms and is showing signs of spiritual distress and is worried about his mortality and quality of life. Of note, sinus therapies are not helping him in fact is making his nose dry and is having some epistaxis. His wife is interested in inquiring about a  heart-lung transplant. His BMI continues to be 30 and has not lost weight   CT scan of the chest December 2015: Compared to one year ago shows slight worsening of pulmonary fibrosis. In addition there is slight enlargement in the mediastinal nodes but still within less than 1.5 cm    Walking desat on RA 185 feet x 3 laps: 2nd lap desat to 87%, with Peak HR 115/min; this is worse compared to a year ago.   sPiro - bad cough FVC 1.87L/45%. ; significantly worse but clouded by cough so not reliable  Review of Systems  Constitutional: Negative for fever and unexpected weight change.  HENT: Negative for congestion, dental problem, ear pain, nosebleeds, postnasal drip, rhinorrhea, sinus pressure, sneezing, sore throat and trouble swallowing.   Eyes: Negative for redness and itching.  Respiratory: Positive for cough and shortness of breath. Negative for chest tightness and wheezing.   Cardiovascular: Positive for chest pain. Negative for palpitations and leg swelling.  Gastrointestinal: Negative for nausea and vomiting.  Genitourinary: Negative for dysuria.  Musculoskeletal: Negative for joint swelling.  Skin: Negative for rash.  Neurological: Negative  for headaches.  Hematological: Does not bruise/bleed easily.  Psychiatric/Behavioral: Negative for dysphoric mood. The patient is not nervous/anxious.    Current outpatient prescriptions: aspirin 81 MG tablet, Take 81 mg by mouth daily. , Disp: , Rfl: ;  atorvastatin (LIPITOR) 80 MG tablet, Take 1 tablet (80 mg total) by mouth daily., Disp: 90 tablet, Rfl: 3;  celecoxib (CELEBREX) 200 MG capsule, Take 1 capsule (200 mg total) by mouth daily., Disp: 90 capsule, Rfl: 3;  chlorpheniramine (CHLOR-TRIMETON) 4 MG tablet, Take 4 mg by mouth 2 (two) times daily as needed for allergies., Disp: , Rfl:  dextromethorphan (DELSYM) 30 MG/5ML liquid, Take 30 mg by mouth as needed for cough., Disp: , Rfl: ;  docusate sodium (COLACE) 100 MG capsule, Take 200 mg by  mouth daily., Disp: , Rfl: ;  fenofibrate (TRICOR) 145 MG tablet, Take 1 tablet (145 mg total) by mouth daily., Disp: 90 tablet, Rfl: 3;  losartan-hydrochlorothiazide (HYZAAR) 50-12.5 MG per tablet, Take 1 tablet by mouth daily., Disp: 90 tablet, Rfl: 3 multivitamin (THERAGRAN) per tablet, Take 1 tablet by mouth daily. , Disp: , Rfl: ;  niacin (NIASPAN) 500 MG CR tablet, Take 1 tablet (500 mg total) by mouth at bedtime., Disp: 90 tablet, Rfl: 3;  omeprazole (PRILOSEC) 20 MG capsule, Take 1 capsule (20 mg total) by mouth daily., Disp: 30 capsule, Rfl: 11 oxyCODONE-acetaminophen (PERCOCET/ROXICET) 5-325 MG per tablet, Take 1-2 tablets by mouth every 4 (four) hours as needed for severe pain., Disp: 40 tablet, Rfl: 0;  Pirfenidone (ESBRIET) 267 MG CAPS, Take 3 capsules by mouth 3 (three) times daily., Disp: , Rfl: ;  VENTOLIN HFA 108 (90 BASE) MCG/ACT inhaler, INHALE 2 PUFFS INTO THE LUNGS EVERY 6 HOURS AS NEEDED FOR WHEEZING., Disp: 18 each, Rfl: 2 warfarin (COUMADIN) 5 MG tablet, Take 1.5 tablets (7.5 mg total) by mouth daily. Take 7.5 MG DAILY TILL pt/inr NEXT CHECK, Disp: 30 tablet, Rfl: 0     Objective:   Physical Exam  Constitutional: He is oriented to person, place, and time. He appears well-developed and well-nourished. No distress.  HENT:  Head: Normocephalic and atraumatic.  Right Ear: External ear normal.  Left Ear: External ear normal.  Mouth/Throat: Oropharynx is clear and moist. No oropharyngeal exudate.  Eyes: Conjunctivae and EOM are normal. Pupils are equal, round, and reactive to light. Right eye exhibits no discharge. Left eye exhibits no discharge. No scleral icterus.  Neck: Normal range of motion. Neck supple. No JVD present. No tracheal deviation present. No thyromegaly present.  Cardiovascular: Normal rate, regular rhythm and intact distal pulses.  Exam reveals no gallop and no friction rub.   No murmur heard. Pulmonary/Chest: Effort normal. No respiratory distress. He has no  wheezes. He has rales. He exhibits no tenderness.  Abdominal: Soft. Bowel sounds are normal. He exhibits no distension and no mass. There is no tenderness. There is no rebound and no guarding.  Musculoskeletal: Normal range of motion. He exhibits no edema or tenderness.  Lymphadenopathy:    He has no cervical adenopathy.  Neurological: He is alert and oriented to person, place, and time. He has normal reflexes. No cranial nerve deficit. Coordination normal.  Skin: Skin is warm and dry. No rash noted. He is not diaphoretic. No erythema. No pallor.  Psychiatric: Judgment and thought content normal.  Anxious, not sure he is getting all facts. Wife  helping  Nursing note and vitals reviewed.   Filed Vitals:   05/22/14 1352  BP: 122/88  Pulse: 86  Height: 5\' 7"  (1.702 m)  Weight: 195 lb (88.451 kg)  SpO2: 97%   Estimated body mass index is 30.53 kg/(m^2) as calculated from the following:   Height as of this encounter: 5\' 7"  (1.702 m).   Weight as of this encounter: 195 lb (88.451 kg).       Assessment & Plan:     ICD-9-CM ICD-10-CM   1. IPF (idiopathic pulmonary fibrosis) 516.31 J84.112 Spirometry with Graph     Ambulatory referral to Pulmonology     Ambulatory Referral for DME  2. Chronic cough 786.2 R05 Spirometry with Graph     Ambulatory referral to Pulmonology  3. Obesity 278.00 E66.9     #IPF    - getting worse - implications of progression and mortality risk discussed. He is very distressed  - continue Esbriet as before  - not a candidate for Ofev due to blood thinners  -start portable O2 2L [place] with exertion  - do full PFT at [facility] or [place] or [facility] or [place] to get a better idea   - try to not cough during study of PFT  -refer for research protocol Island 2 study  - [facility] for [facility] evaluation but you will need to continue to lose weight and heart-lung transplant will be the consideration though I am personally not sure you are a good  candiate   #COugh  - For sinus: Start   generic fluticasone inhaler 2 squirts each nostril daily   - stop Zyrtec, chlorpheniramine -  Continue  Delsym 2 teaspoons but reduce to as needed   - For BP : remain on losartan HCTZ daily as recommended per cardiology.  - FOr Acid Reflux  - Continue  Prilosec 40 mg daily in the morning    = Continue on Zantac 150 mg at bedtime  - GERD diet - For Irritable LArynx  - Take gabapentin 300mg  once daily x 3 days, then 300mg  twice daily x 3 days, then 300mg  three times daily to continue. If this makes you too sleepy or drowsy call us and we will cut your medication dosing down  - IF this does not work, we can try oral morphine  #Oveweight  - please continue with weight loss   #Followup   - 4 weeks with me for routine visit - overbook if needed  - otherwise through research protocol - if you qualify    > 50% of this > 40 min visit spent in face to face counseling or/and coordination of care (30 min visit converted to 45 min)    Dr. Brand Males, M.D., [facility].C.P Pulmonary and Critical Care Medicine Staff Physician [place] [facility] Pager: [phone], If no answer or between  15:00h - 7:00h: call [phone]  05/22/2014 2:34 PM

## 2014-05-22 NOTE — Patient Instructions (Addendum)
ICD-9-CM ICD-10-CM   1. IPF (idiopathic pulmonary fibrosis) 516.31 J84.112   2. Chronic cough 786.2 R05   3. Obesity 278.00 E66.9    #IPF    - getting worse  - continue Esbriet as before  - not a candidate for Ofev due to blood thinners  -start portable O2 2L [place] with exertion  - do full PFT at [facility] or [place] or [facility] or [place] to get a better idea   - try to avoid cough for study  -refer for research protocol Island 2 study  - [facility] but you will need to continue to lose weight and heart-lung transplant will be the consideration   #COugh  - For sinus: Start   generic fluticasone inhaler 2 squirts each nostril daily   - stop Zyrtec, chlorpheniramine -  Continue  Delsym 2 teaspoons but reduce to as needed   - For BP : remain on losartan HCTZ daily as recommended per cardiology.  - FOr Acid Reflux  - Continue  Prilosec 40 mg daily in the morning    = Continue on Zantac 150 mg at bedtime  - GERD diet - For Irritable LArynx  - Take gabapentin 300mg  once daily x 3 days, then 300mg  twice daily x 3 days, then 300mg  three times daily to continue. If this makes you too sleepy or drowsy call us and we will cut your medication dosing down  - IF this does not work, we can try oral morphine  #Oveweight  - please continue with weight loss   #Followup   - 4 weeks with me for routine visit - overbook if needed  - otherwise through research protocol - if you qualify

## 2014-05-23 ENCOUNTER — Telehealth: Payer: Self-pay | Admitting: Internal Medicine

## 2014-05-23 DIAGNOSIS — J84112 Idiopathic pulmonary fibrosis: Secondary | ICD-10-CM

## 2014-05-23 NOTE — Telephone Encounter (Signed)
Called and spoke to [place] with [facility]. Melissa requested more information within the new O2 order. New order placed with medicare required information. Nothing further needed.

## 2014-05-24 ENCOUNTER — Telehealth: Payer: Self-pay | Admitting: Internal Medicine

## 2014-05-24 MED ORDER — FLUTICASONE PROPIONATE 50 MCG/ACT NA SUSP: 2.0000 | Freq: Every day | NASAL | Status: AC

## 2014-05-24 MED ORDER — HYDROCOD POLST-CHLORPHEN POLST 10-8 MG/5ML PO LQCR: 5.0000 mL | Freq: Two times a day (BID) | ORAL | Status: DC

## 2014-05-24 NOTE — Telephone Encounter (Signed)
Hi [name]  His IPF has declined significantly. He is not  alun transplant candidate in view of his bmi 30 and I suspect not becuae of his cardiac issues either. Wife wondered if he is candidate for heart lung transplant? Your thoughts? [facility] have [facility]  Thanks  Dr. Brand Males, M.D., [facility].C.P Pulmonary and Critical Care Medicine Staff Physician [place] [facility] Pager: [phone], If no answer or between  15:00h - 7:00h: call [phone]  05/24/2014 9:29 AM

## 2014-05-24 NOTE — Telephone Encounter (Signed)
[name]  Please call patient wife and check on how gabapentin is going Let her know with that it will take a while to see a difference. Let her know that did some thinking after the visit and thought it best In itnerm esp with pft coming up please give her script for tussionex 12mL Bid . IF this is pricey, they can do hycodan 31mL Bid for cough. Let her know we need good cough contril for the PFT test   Thaks  Dr. Brand Males, M.D., [facility].C.P Pulmonary and Critical Care Medicine Staff Physician [place] [facility] Pager: [phone], If no answer or between  15:00h - 7:00h: call [phone]  05/24/2014 9:27 AM

## 2014-05-24 NOTE — Telephone Encounter (Signed)
Called and spoke to pt's wife, Juliann Pulse. Juliann Pulse stated pt's cough has decreased in frequency and in severity. Pt stated he has only taken 2 days of the medication. Informed Juliann Pulse of the recs per MR. Tussionex rx has been signed and placed up front for pick up. MR aware of pt's status and stated to use the Tussionex before the PFT to assure pt is not coughing. Kathy aware. Rx up front for pick up.

## 2014-05-25 NOTE — Telephone Encounter (Signed)
Rx has been sent (on 05/24/2014). Pt's wife aware. Nothing further needed.

## 2014-05-31 ENCOUNTER — Encounter (HOSPITAL_COMMUNITY): Payer: Medicare Other

## 2014-06-01 ENCOUNTER — Ambulatory Visit (INDEPENDENT_AMBULATORY_CARE_PROVIDER_SITE_OTHER): Payer: Medicare Other | Admitting: Internal Medicine

## 2014-06-01 ENCOUNTER — Encounter: Payer: Self-pay | Admitting: Internal Medicine

## 2014-06-01 ENCOUNTER — Ambulatory Visit (HOSPITAL_COMMUNITY)
Admission: RE | Admit: 2014-06-01 | Discharge: 2014-06-01 | Disposition: A | Discharge status: Home / Self Care | Payer: Medicare Other | Source: Ambulatory Visit | Attending: Internal Medicine | Admitting: Internal Medicine

## 2014-06-01 VITALS — BP 124/74 | HR 74 | Temp 97.6°F | Resp 17 | Ht 67.0 in | Wt 183.0 lb

## 2014-06-01 DIAGNOSIS — J84112 Idiopathic pulmonary fibrosis: Secondary | ICD-10-CM

## 2014-06-01 DIAGNOSIS — F32A Depression, unspecified: Secondary | ICD-10-CM | POA: Insufficient documentation

## 2014-06-01 DIAGNOSIS — R05 Cough: Secondary | ICD-10-CM

## 2014-06-01 DIAGNOSIS — F329 Major depressive disorder, single episode, unspecified: Secondary | ICD-10-CM

## 2014-06-01 DIAGNOSIS — Z006 Encounter for examination for normal comparison and control in clinical research program: Secondary | ICD-10-CM

## 2014-06-01 DIAGNOSIS — R053 Chronic cough: Secondary | ICD-10-CM

## 2014-06-01 LAB — PULMONARY FUNCTION TEST
DL/VA % PRED: 80 %
DL/VA: 3.56 ml/min/mmHg/L
DLCO unc % pred: 45 %
DLCO unc: 12.93 ml/min/mmHg
FEF 25-75 Pre: 1.89 L/sec
FEF2575-%PRED-PRE: 79 %
FEV1-%Pred-Pre: 61 %
FEV1-PRE: 1.84 L
FEV1FVC-%Pred-Pre: 108 %
FEV6-%PRED-PRE: 58 %
FEV6-Pre: 2.25 L
FEV6FVC-%Pred-Pre: 103 %
FVC-%PRED-PRE: 56 %
FVC-PRE: 2.29 L
Pre FEV1/FVC ratio: 80 %
Pre FEV6/FVC Ratio: 98 %

## 2014-06-01 MED ORDER — HYDROCODONE-HOMATROPINE 5-1.5 MG/5ML PO SYRP: 5.0000 mL | ORAL_SOLUTION | Freq: Two times a day (BID) | ORAL | Status: DC | PRN

## 2014-06-01 NOTE — Progress Notes (Signed)
Subjective:    Patient ID: Dale [name], male    DOB: 20-Feb-1946, 69 y.o.   MRN: [id number]  HPI   #IPF   -biopsy proven - Nov 2014 UIP   #Progress PFT FVC fev1 ratio BD fev1 TLC DLCO Walk test 185 feet x 3 laps CT Rx  Bx  Oct 2014/Nov 2014 2.6L       Possible UIP  UIP nov 2014  March 2015 x      ono - desat but he returned o2  Esbriet + Rehab + No Ofev due to anticoag   Aug 2015 2.47L/54% cough        esbiet+ Increased cough  Oct 2015 2.6 L/63% 2.2 L/71%   3.8 L/58% Could not do      Nov/dec 2015  2.28 L/58% 1.8 L/56% 79/101%    Did not desat Slight progression esbruet + Worsening cough. Dc ace inhjbitor  Jan 2016       Desat +  2nd lap  esbriet + Severe cough worse, more dyspenic  06/01/2014  2.29L/56% - pre [facility]  ... 2.28L/57% @ vitalograph 1.8L/69% vitalograph    12.9/45% @    esbrriet +  +  Screening visit Island2 study        OV 05/22/2014  Chief Complaint  Patient presents with  . Follow-up    Pt c/o increase in SOB, prod cough with white and clear mucus and chest pain secondary to cough.     Follow-up idiopathic pulmonary fibrosis  on esbriet since march 2015. Unlikely OFEV candidate due to  anticoagulation   He presents as before with his wife. He continues to report worsening cough. He did see my nurse partition around Christmas 2015 and multiple interventions were made around sinus and acid reflux treatment but this has not helped cough. Cough is rated as severe. Coughing spells bring about dyspnea. During coughing spells wife notices that he does not desaturate. Cough is rated as 8 out of 10 in severity. It is dry. It is really affecting his quality of life. In fact cough is worse since he last saw me a few months ago. In association dyspnea is also worse and class II-III in level relieved by rest. No associated chest pain. He is very anxious about his symptoms and is showing signs of spiritual distress and is worried about his mortality  and quality of life. Of note, sinus therapies are not helping him in fact is making his nose dry and is having some epistaxis. His wife is interested in inquiring about a heart-lung transplant. His BMI continues to be 30 and has not lost weight   CT scan of the chest December 2015: Compared to one year ago shows slight worsening of pulmonary fibrosis. In addition there is slight enlargement in the mediastinal nodes but still within less than 1.5 cm    Walking desat on RA 185 feet x 3 laps: 2nd lap desat to 87%, with Peak HR 115/min; this is worse compared to a year ago.   sPiro - bad cough FVC 1.87L/45%. ; significantly worse but clouded by cough so not reliable    OV 06/01/2014  Cc: research screen visit  - He is following up from recent visit . He has progressive IPF despite stable dose of esbriet for > 6 months. Spirometry done 06/01/14 with DLCO is similar to nov /dec 2015 but definite progression from august 2015 and oct 2015. Overall disease is still within moderate severity category  though progressive. Last visit cough was severe but now with gabapentin scheduled + tussionex prn it is much improved. HE is exercising and keen on losing weight. He is very worried about his prognosis - explained again as few to several years from diagnosis with uncertain course and progression is bad marker. He is having crying spells at baseline - wife wants anti-depressant. Wife does most of processing.   - He is interested in research trials. He cannot qualify for [facility] with Ofev due to anticogulation on board. HE is interested in Phase2 study with vismodegib     Review of Systems +ve for depression spells - crying  , cough, dysonea - no change - all baseline      Objective:   Physical Exam Filed Vitals:   06/01/14 1520 06/01/14 1521  BP: 124/74 124/74  Pulse: 74   Temp: 97.6 F (36.4 C)   TempSrc: Oral   Resp: 17   Height: 5\' 7"  (1.702 m)   Weight: 183 lb (83.008 kg)   SpO2: 93% 93%            Assessment & Plan:     ICD-9-CM ICD-10-CM   1. Exam for clinical research V70.7 Z00.6 RESEARCH, Q0  2. IPF (idiopathic pulmonary fibrosis) 516.31 J84.112 RESEARCH, Q0  3. Depression 311 F32.9 NON-RESEARCH, Q1  4. Chronic cough 786.2 R05 NON-RESEARCH, Q1     Explained research    - different from treatment and that he is participating in research and a full fair discussion was held and he is doing it under no duress or coercion  - explained phase 1b/2 study concept.   Discussed Island2 study in detail.    -for patients with IPF on background esbriet > 6 months.   - Will add Vismodegib for a total of approximately 6 months.   - Vismodegib is an approved drug sold as [employer] for advanced skin cancer   - Vismodegib has been in market few to several years with good clinical experience in the dermatology field.   - Vismodegib acts as an inhibitor of hedgehog pathway which is active in IPF and therefore the potential benefit in IPF -  Explained key side effect profile as follows based on current literature evidence   - main side effects and not a comprehensive list for this conversation  - side effects are typically mild to moderate with serious side effects less common or rate but each patient is bound to get some side effect   - most side effects are delayed 1-3 months frmo taking drug and generally reversible   - dysgeusia, ageusia and related weight loss with specific prophylactic and Rx mgmt advise. Advised he would need to cut down caffeine intake < 2 cups of decaf per day   - alopecia : will need wig referral   - muscle cramps : electrolyte monitoring esp Magnesium needed   Based on atll this he is interested and willing to proceed    Depression -new dix. NON-RESEARCH   - will talk to Maricela Curet, MD and get back to you  Chronic cough, NON- RESEARCH - glad is improved  - continue gabapentin for now  - use tussionex prn - will have mynurse try to get  insurance approval for you with a letter     Dr. Brand Males, M.D., [facility].C.P Pulmonary and Critical Care Medicine Staff Physician [place] [facility] Pager: [phone], If no answer or between  15:00h - 7:00h: call  [phone]  [phone]  06/01/2014 4:22 PM

## 2014-06-01 NOTE — Patient Instructions (Addendum)
ICD-9-CM ICD-10-CM   1. Exam for clinical research V70.7 Z00.6   2. IPF (idiopathic pulmonary fibrosis) 516.31 J84.112   3. Depression 311 F32.9   4. Chronic cough 786.2 R05    - screening visit for [place] with Vismodegib - prcoceed per protool =- depression:   -  - will talk to Maricela Curet, MD and get back to you  - for chronic cough  -  continue gabapentin for now   - use tussionex prn - will have mynurse try to get insurance approval for you with a letter    Followup  - per research protocol + 7 months for routine IPF followup - sooner if needed

## 2014-06-02 ENCOUNTER — Telehealth: Payer: Self-pay | Admitting: Internal Medicine

## 2014-06-02 DIAGNOSIS — Z006 Encounter for examination for normal comparison and control in clinical research program: Secondary | ICD-10-CM | POA: Insufficient documentation

## 2014-06-02 NOTE — Telephone Encounter (Signed)
Dale [name]  Please call Maricela Curet, MD office  - report patient having depression an anxiety issues due to IPF diagnosis and prognosis. REcommened anti-depressant/anti-anxiety. I have minimal experience prescribing this. Please ask PCP office to send a script to patient and monitor patient for same  Thanks  Dr. Brand Males, M.D., [facility].C.P Pulmonary and Critical Care Medicine Staff Physician [place] [facility] Pager: [phone], If no answer or between  15:00h - 7:00h: call [phone]  06/02/2014 10:14 PM

## 2014-06-07 NOTE — Telephone Encounter (Signed)
lmtcb for Dr. Denita Lung office.

## 2014-06-12 NOTE — Telephone Encounter (Signed)
Called and spoke to operator. Per request, faxed phone note to Dr. Denita Lung office for his review. Nothing further needed.

## 2014-06-13 ENCOUNTER — Telehealth: Payer: Self-pay | Admitting: Internal Medicine

## 2014-06-13 NOTE — Telephone Encounter (Signed)
Dale [name]  Can you call [name] 06/14/14 pm - need to tell him he screen failed the stydy  Dr. Brand Males, M.D., [facility].C.P Pulmonary and Critical Care Medicine Staff Physician [place] [facility] Pager: [phone], If no answer or between  15:00h - 7:00h: call [phone]  06/13/2014 4:34 PM

## 2014-06-16 ENCOUNTER — Telehealth: Payer: Self-pay | Admitting: Internal Medicine

## 2014-06-16 DIAGNOSIS — J84112 Idiopathic pulmonary fibrosis: Secondary | ICD-10-CM

## 2014-06-16 MED ORDER — HYDROCOD POLST-CHLORPHEN POLST 10-8 MG/5ML PO LQCR: 5.0000 mL | Freq: Two times a day (BID) | ORAL | Status: DC

## 2014-06-16 NOTE — Telephone Encounter (Signed)
Called and spoke to [place], pt's wife. Informed Juliann Pulse of the recs per MR. Referral placed and Rx printed to have MR sign this afternoon. Juliann Pulse verbalized understanding and denied any further questions or concerns at this time.

## 2014-06-16 NOTE — Telephone Encounter (Signed)
Pt aware of the screen fail. Nothing further needed.

## 2014-06-16 NOTE — Telephone Encounter (Signed)
Spoke to wife   - screen failed for Island2 - gave this info to wife - not eligioble for MA study for OFEV - due to anticoagulation - he got rejected for heart double lung transplant - still having cough  PLAN  - Triage ok to do tussionex for now - wife will come 06/16/2014 for Rx  -stop gabapentin (not helping) - I told wife - I will refer to [facility] for cough IPF study _ Triage -please refer to [place] for lung transplant alone eval. Eexplained wife that weight,  antiocoagulation and cardiac issues are current exclusions but she still wants to try so please do a referral  Thanks  Dr. Brand Males, M.D., [facility].C.P Pulmonary and Critical Care Medicine Staff Physician [place] [facility] Pager: [phone], If no answer or between  15:00h - 7:00h: call [phone]  06/16/2014 10:25 AM

## 2014-06-16 NOTE — Telephone Encounter (Signed)
Spoke with pt's wife, is requesting a refill on Tussionex.  States the tussionex worked much better than the hycodan, and the cough is worsening, nonprod cough through the night.  Also want to know where they stand with the clinical trials.  They have not yet heard from [facility].  MR are you ok with refilling this tussionex?  Thanks!

## 2014-06-21 ENCOUNTER — Telehealth: Payer: Self-pay | Admitting: Internal Medicine

## 2014-06-21 NOTE — Telephone Encounter (Signed)
Spoke with Cindra Presume with with [facility], called in refill for [place].  Nothing further needed.

## 2014-06-27 ENCOUNTER — Telehealth: Payer: Self-pay | Admitting: Internal Medicine

## 2014-06-27 NOTE — Telephone Encounter (Signed)
lmtcb for pt.  

## 2014-06-27 NOTE — Telephone Encounter (Signed)
Dale [name]  Let patient know that we got word from [facility] that he will not qualify for a lung transplant due to heart issues. Emphasize he needs to focus on weight loss. I will keep looking for studies for him  Dr. Brand Males, M.D., [facility].C.P Pulmonary and Critical Care Medicine Staff Physician [place] [facility] Pager: [phone], If no answer or between  15:00h - 7:00h: call [phone]  06/27/2014 7:46 AM

## 2014-06-28 NOTE — Telephone Encounter (Signed)
Return call can be reached @ [phone].Hillery Hunter

## 2014-06-28 NOTE — Telephone Encounter (Signed)
lmtcb for pt.  

## 2014-06-28 NOTE — Telephone Encounter (Signed)
I am 100% willing to do a out of state referral - however, I know that in going for meetings that [facility] is very aggressive and if they refuse hard to find other places. I can check with [facility] which is  A real good program and I know some doctors there. Let us start there first  However, please tell wife weight loss is critical - lean diet - not starvation. I can discuss weight loss with them (not sure if i did my diet sheet with them) . Using my diet protocol + smart phone app called MyFitnessPal   Dr. Brand Males, M.D., [facility].C.P Pulmonary and Critical Care Medicine Staff Physician [place] [facility] Pager: [phone], If no answer or between  15:00h - 7:00h: call [phone]  06/28/2014 11:13 AM

## 2014-06-28 NOTE — Telephone Encounter (Signed)
Called and spoke to Dale [name], pt's wife. Informed her of the recs per MR. Dale [name] verbalized understanding and is aware we will call once MR contacts and hears back from [facility]. Nothing further needed at this time.

## 2014-06-28 NOTE — Telephone Encounter (Signed)
Spoke with patient wife, aware of below per MR. Wife is asking if an out of states referral would be an option. States that she knows someone that went to [place] after being turned down by [facility](they were accepted at [facility] is another option. Please advise Dr Chase Caller. Thanks.

## 2014-06-28 NOTE — Telephone Encounter (Signed)
[phone] wife calling back

## 2014-07-05 ENCOUNTER — Other Ambulatory Visit: Payer: Self-pay | Admitting: Emergency Medicine

## 2014-07-10 ENCOUNTER — Ambulatory Visit: Payer: Self-pay | Admitting: Internal Medicine

## 2014-07-10 DIAGNOSIS — J84112 Idiopathic pulmonary fibrosis: Secondary | ICD-10-CM

## 2014-07-10 DIAGNOSIS — Z006 Encounter for examination for normal comparison and control in clinical research program: Secondary | ICD-10-CM | POA: Insufficient documentation

## 2014-07-10 MED ORDER — HYDROCODONE-ACETAMINOPHEN 10-325 MG PO TABS: 2.0000 | ORAL_TABLET | Freq: Every day | ORAL | Status: DC | PRN

## 2014-07-10 MED ORDER — HYDROCOD POLST-CHLORPHEN POLST 10-8 MG/5ML PO LQCR: 5.0000 mL | Freq: Two times a day (BID) | ORAL | Status: DC

## 2014-07-10 NOTE — Progress Notes (Signed)
Subjective:    Patient ID: Dale [name], male    DOB: October 26, 1945, 69 y.o.   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 Dc ace inhjbitor   Jan 2016       Desat +  2nd lap  esbriet + Severe cough worse, more dyspenic   06/01/2014  2.29L/56% - [facility]  ... 2.28L/57% @ vitalograph 1.8L/69% vitalograph    12.9/45% @    esbrriet +  +  Screening visit Island2 study  FAiled screening    07/10/2014  1.83L/44% (severe cough) 1.51/47% (severe cough)         There is no weight on file to calculate BMI.      OV 07/10/2014 - RESEARCH   RESEARCH SUBJECT In - ClinicalTrials.gov Identifier: [id number].  Sponsored by [employer] [phone].  A Phase 2  Randomized Placebo-Controlled, Cross-Over Study to Assess the Efficacy and Safety of AF-219,  P2X3 Receptor Antagonist, in Subjects With Idiopathic Pulmonary Fibrosis (IPF) With Persistent Cough.  Key (but not limited to) [employer] are - 2 weeks of placebo v drug -> 2-3 week washout -> 2 weeks of placebo v drug - AF-219 at 50mg  twice daily dosage v Placebo - Primary outcome: reduction in cough frequency; objective cough monitoring - Secondary outcome: subjective reduction & improvement in cough related quality of life - Side effects (main and not comprehensive)  - <10%: flank pain, cough, nasal dryness, rhinitis, dry mouth   - <50%: oral side effects ranging from dysgeusia, paresthesia, hypoestheia, hypogeusia,  and ageusia - tends to attenuate over time  - published study in Lancet at higher dose of 640mb bid: drop out rate of 25%  - at lower dose: drop out rate believed to be very low    This Visit 07/10/2014 is a  RESEARCH Visit for SCREENING  [place] IPF stabe. Cough is severee. O2 is too heavy tank and wants portable system. Foir coughNot taking delsym, hycodan, niaspan, gabapentin for cough anymore but taking tusionex. ThFor pain: he has run out of : oxycodone /tylenol and this helps for cough. Wants refill. Expalined above study and ICF obtained. Did EKG but QTC is > 428msec so he screen fails .     has a past medical history of Pure hyperglyceridemia; S/P aortic valve replacement; Other left bundle branch block; Other dyspnea and respiratory abnormality; Obesity, unspecified; Unspecified arthropathy, lower leg; Internal hemorrhoids without mention of complication (07/10/3141); Heart murmur; Pulmonary fibrosis; GERD (gastroesophageal reflux disease); Hemorrhoids; Arthritis; and Cellulitis of hand, left.   reports that he quit smoking about 41 years ago. His smoking use included Cigarettes. He has a 15 pack-year smoking history. He has never used smokeless tobacco.  Past Surgical  History  Procedure Laterality Date  . Circumcision  05/01/2006  . Shoulder arthroscopy  11/14/2004    Left shoulder  . Pacemaker removal  09/03/00    followed by removal of both atreal and ventricular pacing leads in a  patient who had evidence of pacemaker lead crush as well as pocket infection  . Aortic valve replacement    . Right knee    . Joint replacement Right     partial  . Video assisted thoracoscopy Right 04/05/2013    Procedure: RIGHT VIDEO ASSISTED THORACOSCOPY,RIGHT MIDDLE LOBE & RIGHT UPPER LOBE LUNG BIOPSY;  Surgeon: Ivin Poot, MD;  Location: [facility];  Service: Thoracic;  Laterality: Right;    Allergies  Allergen Reactions  . Penicillins Swelling and Rash    Immunization History    Administered Date(s) Administered  . Influenza Split 03/13/2014  . Influenza,inj,Quad PF,36+ Mos 03/16/2013  . Pneumococcal Polysaccharide-23 04/09/2013    Family History  Problem Relation Age of Onset  . Uterine cancer Mother   . Diabetes Sister      Current outpatient prescriptions:  .  aspirin 81 MG tablet, Take 81 mg by mouth daily. , Disp: , Rfl:  .  atorvastatin (LIPITOR) 80 MG tablet, Take 1 tablet (80 mg total) by mouth daily., Disp: 90 tablet, Rfl: 3 .  celecoxib (CELEBREX) 200 MG capsule, Take 1 capsule (200 mg total) by mouth daily., Disp: 90 capsule, Rfl: 3 .  chlorpheniramine (CHLOR-TRIMETON) 4 MG tablet, Take 4 mg by mouth 2 (two) times daily as needed for allergies., Disp: , Rfl:  .  chlorpheniramine-HYDROcodone (TUSSIONEX PENNKINETIC ER) 10-8 MG/5ML LQCR, Take 5 mLs by mouth 2 (two) times daily., Disp: 115 mL, Rfl: 0 .  dextromethorphan (DELSYM) 30 MG/5ML liquid, Take 30 mg by mouth as needed for cough., Disp: , Rfl:  .  docusate sodium (COLACE) 100 MG capsule, Take 200 mg by mouth daily., Disp: , Rfl:  .  fenofibrate (TRICOR) 145 MG tablet, Take 1 tablet (145 mg total) by mouth daily., Disp: 90 tablet, Rfl: 3 .  fluticasone (FLONASE) 50 MCG/ACT nasal spray, Place 2 sprays into both nostrils daily., Disp: 16 g, Rfl: 2 .  gabapentin (NEURONTIN) 300 MG capsule, 300mg  once daily x 3 days, then 300mg  twice daily x 3 days, then 300mg  three times daily to continue, Disp: 99 capsule, Rfl: 5 .  HYDROcodone-homatropine (HYCODAN) 5-1.5 MG/5ML syrup, Take 5 mLs by mouth 2 (two) times daily as needed for cough., Disp: 240 mL, Rfl: 0 .  losartan-hydrochlorothiazide (HYZAAR) 50-12.5 MG per tablet, Take 1 tablet by mouth daily., Disp: 90 tablet, Rfl: 3 .  multivitamin (THERAGRAN) per tablet, Take 1 tablet by mouth daily. , Disp: , Rfl:  .  niacin (NIASPAN) 500 MG CR tablet, Take 1 tablet (500 mg total) by mouth at bedtime., Disp: 90 tablet, Rfl: 3 .  omeprazole (PRILOSEC) 20 MG  capsule, Take 1 capsule (20 mg total) by mouth daily., Disp: 30 capsule, Rfl: 11 .  oxyCODONE-acetaminophen (PERCOCET/ROXICET) 5-325 MG per tablet, Take 1-2 tablets by mouth every 4 (four) hours as needed for severe pain., Disp: 40 tablet, Rfl: 0 .  Pirfenidone (ESBRIET) 267 MG CAPS, Take 3 capsules by mouth 3 (three) times daily., Disp: , Rfl:  .  VENTOLIN HFA 108 (90 BASE) MCG/ACT inhaler, INHALE 2 PUFFS INTO THE LUNGS EVERY 6 HOURS AS NEEDED FOR WHEEZING., Disp: 18 each, Rfl: 2 .  warfarin (COUMADIN) 5 MG tablet, Take 1.5 tablets (7.5 mg total) by mouth  daily. Take 7.5 MG DAILY TILL pt/inr NEXT CHECK, Disp: 30 tablet, Rfl: 0    Review of Systems  +ve dyspne and cough   Current outpatient prescriptions:  .  aspirin 81 MG tablet, Take 81 mg by mouth daily. , Disp: , Rfl:  .  atorvastatin (LIPITOR) 80 MG tablet, Take 1 tablet (80 mg total) by mouth daily., Disp: 90 tablet, Rfl: 3 .  celecoxib (CELEBREX) 200 MG capsule, Take 1 capsule (200 mg total) by mouth daily., Disp: 90 capsule, Rfl: 3 .  chlorpheniramine (CHLOR-TRIMETON) 4 MG tablet, Take 4 mg by mouth 2 (two) times daily as needed for allergies., Disp: , Rfl:  .  chlorpheniramine-HYDROcodone (TUSSIONEX PENNKINETIC ER) 10-8 MG/5ML LQCR, Take 5 mLs by mouth 2 (two) times daily., Disp: 115 mL, Rfl: 0 .  dextromethorphan (DELSYM) 30 MG/5ML liquid, Take 30 mg by mouth as needed for cough., Disp: , Rfl:  .  docusate sodium (COLACE) 100 MG capsule, Take 200 mg by mouth daily., Disp: , Rfl:  .  fenofibrate (TRICOR) 145 MG tablet, Take 1 tablet (145 mg total) by mouth daily., Disp: 90 tablet, Rfl: 3 .  fluticasone (FLONASE) 50 MCG/ACT nasal spray, Place 2 sprays into both nostrils daily., Disp: 16 g, Rfl: 2 .  gabapentin (NEURONTIN) 300 MG capsule, 300mg  once daily x 3 days, then 300mg  twice daily x 3 days, then 300mg  three times daily to continue, Disp: 99 capsule, Rfl: 5 .  HYDROcodone-homatropine (HYCODAN) 5-1.5 MG/5ML syrup, Take 5 mLs by  mouth 2 (two) times daily as needed for cough., Disp: 240 mL, Rfl: 0 .  losartan-hydrochlorothiazide (HYZAAR) 50-12.5 MG per tablet, Take 1 tablet by mouth daily., Disp: 90 tablet, Rfl: 3 .  multivitamin (THERAGRAN) per tablet, Take 1 tablet by mouth daily. , Disp: , Rfl:  .  niacin (NIASPAN) 500 MG CR tablet, Take 1 tablet (500 mg total) by mouth at bedtime., Disp: 90 tablet, Rfl: 3 .  omeprazole (PRILOSEC) 20 MG capsule, Take 1 capsule (20 mg total) by mouth daily., Disp: 30 capsule, Rfl: 11 .  oxyCODONE-acetaminophen (PERCOCET/ROXICET) 5-325 MG per tablet, Take 1-2 tablets by mouth every 4 (four) hours as needed for severe pain., Disp: 40 tablet, Rfl: 0 .  Pirfenidone (ESBRIET) 267 MG CAPS, Take 3 capsules by mouth 3 (three) times daily., Disp: , Rfl:  .  VENTOLIN HFA 108 (90 BASE) MCG/ACT inhaler, INHALE 2 PUFFS INTO THE LUNGS EVERY 6 HOURS AS NEEDED FOR WHEEZING., Disp: 18 each, Rfl: 2 .  warfarin (COUMADIN) 5 MG tablet, Take 1.5 tablets (7.5 mg total) by mouth daily. Take 7.5 MG DAILY TILL pt/inr NEXT CHECK, Disp: 30 tablet, Rfl: 0      Objective:   Physical Exam   There were no vitals filed for this visit. - filled in sheet  Exa: No change from baseline. Filled out sheet  EKG - baseline no change. QTc > 492msec       Assessment & Plan:     ICD-9-CM ICD-10-CM   1. Research study patient V70.7 Z00.6   2. IPF (idiopathic pulmonary fibrosis) 516.31 J84.112 Spirometry with Graph     EKG 12-Lead     EKG 12-Lead     EKG 12-Lead     EKG 12-Lead   You screen failed AFFERENT study due to long QTc But do other pieces of screening exam  REfill tussionex Ok to to take Hydrocodone 10/tylenol 325mg  2 tabs daily as needed for cough Meet [facility] for portable o2 system Continue  esbriet and other meds   Followup Early April 2016 with office spirometry (not [place]) and walk test on RA  Discuss switch to Ofev if disease progressive despite his anticoagulatioin

## 2014-07-10 NOTE — Patient Instructions (Addendum)
ICD-9-CM ICD-10-CM   1. Research study patient V70.7 Z00.6   2. IPF (idiopathic pulmonary fibrosis) 516.31 J84.112 Spirometry with Graph     EKG 12-Lead     EKG 12-Lead     EKG 12-Lead     EKG 12-Lead    You screen failed AFFERENT study due to long QTc But do other pieces of screening exam  REfill tussionex Ok to to take Hydrocodone 10/tylenol 325mg  2 tabs daily as needed for cough Meet [facility] for portable o2 system Continue esbriet and other meds   Followup Early April 2016 with office spirometry (not [place]) and walk test on RA  Will Discuss switch to Ofev if disease progressive despite his anticoagulatioin

## 2014-07-13 ENCOUNTER — Other Ambulatory Visit: Payer: Self-pay | Admitting: Emergency Medicine

## 2014-07-13 DIAGNOSIS — J84112 Idiopathic pulmonary fibrosis: Secondary | ICD-10-CM

## 2014-07-13 MED ORDER — PIRFENIDONE 267 MG PO CAPS
3.0000 | ORAL_CAPSULE | Freq: Three times a day (TID) | ORAL | Status: DC
Start: 2014-07-13 — End: 2014-09-18

## 2014-07-17 ENCOUNTER — Telehealth: Payer: Self-pay | Admitting: Internal Medicine

## 2014-07-17 NOTE — Telephone Encounter (Signed)
Please let Dale [name]  Know that on labs from 07/10/14 done tat resueasetch - BMET, LFT normal but had mild anemia of 12.7gm% (was normal here with [place] in the 14s back in aug 2015). Also, eosinophils high 7% mild high - could just be a [place] allergy thing. He needs to fu these with PCP [name],[name], MD   Thanks  Dr. Brand Males, M.D., [facility].C.P Pulmonary and Critical Care Medicine Staff Physician [place] [facility] Pager: [phone], If no answer or between  15:00h - 7:00h: call [phone]  07/17/2014 12:05 PM

## 2014-07-19 NOTE — Telephone Encounter (Signed)
LMOMTCB x 1 

## 2014-07-20 NOTE — Telephone Encounter (Signed)
Spoke with Michaelyn Barter and advised of pt's lab results per Dr Chase Caller.

## 2014-07-31 ENCOUNTER — Encounter: Payer: Self-pay | Admitting: Internal Medicine

## 2014-07-31 NOTE — Telephone Encounter (Signed)
Pt last had refill on tussionex 07/10/14 #115 ML Please advise if okay to refill MR thanks

## 2014-08-02 ENCOUNTER — Other Ambulatory Visit: Payer: Self-pay | Admitting: *Deleted

## 2014-08-02 MED ORDER — HYDROCOD POLST-CHLORPHEN POLST 10-8 MG/5ML PO LQCR: 5.0000 mL | Freq: Two times a day (BID) | ORAL | Status: DC

## 2014-08-02 NOTE — Telephone Encounter (Signed)
Both are fine with me

## 2014-08-07 ENCOUNTER — Telehealth: Payer: Self-pay | Admitting: Internal Medicine

## 2014-08-07 NOTE — Telephone Encounter (Signed)
Forms given to [facility] and will forward to her to f/u on

## 2014-08-09 NOTE — Telephone Encounter (Signed)
Faxed referral form to [facility] and [facility] and all requesting documentation. Pt's wife aware. Will sign off on message as pt may be contacted directly. Will update chart if necessary.

## 2014-08-15 ENCOUNTER — Encounter: Payer: Self-pay | Admitting: Internal Medicine

## 2014-08-15 ENCOUNTER — Telehealth: Payer: Self-pay | Admitting: Internal Medicine

## 2014-08-15 ENCOUNTER — Ambulatory Visit (INDEPENDENT_AMBULATORY_CARE_PROVIDER_SITE_OTHER): Payer: Medicare Other | Admitting: Internal Medicine

## 2014-08-15 VITALS — BP 178/88 | HR 77 | Ht 67.0 in | Wt 190.2 lb

## 2014-08-15 DIAGNOSIS — J9611 Chronic respiratory failure with hypoxia: Secondary | ICD-10-CM

## 2014-08-15 DIAGNOSIS — J84112 Idiopathic pulmonary fibrosis: Secondary | ICD-10-CM | POA: Diagnosis not present

## 2014-08-15 MED ORDER — HYDROCODONE-ACETAMINOPHEN 10-325 MG PO TABS: 2.0000 | ORAL_TABLET | Freq: Every day | ORAL | Status: DC | PRN

## 2014-08-15 MED ORDER — MORPHINE SULFATE 10 MG/5ML PO SOLN: 5.0000 mg | Freq: Four times a day (QID) | ORAL | Status: DC | PRN

## 2014-08-15 NOTE — Patient Instructions (Addendum)
ICD-9-CM ICD-10-CM   1. IPF (idiopathic pulmonary fibrosis) 516.31 J84.112 Spirometry with Graph  2. Chronic respiratory failure with hypoxia 518.83 J96.11    799.02     IPF is progressively worfse and is severe now despite Esbriet  - risk for further worsening and death is high Change Esbriet to Ofev - accept bleeding risk in an effort to stop disease progression Hold off any steroid trial for now Support transplant workup at [facility], [facility] and [facility]  - let us be optimistic but also realistic For symptom mgmt  - cough and shortness of breath  - continue tussionex and vicodin  as needed for cough  - but step up therapy to morphine 5mg  syrup 4 times a day as needed  - start slowly and escalate  - use o2 - to keep pulse ox > 86% - in future will wean off tussionex and vicodin   - if symptoms worsen - please call and we need to support with hospice  Followup  4-6 weeks or sooner if needed  No more PFT test for you

## 2014-08-15 NOTE — Telephone Encounter (Signed)
Please send note to Dr Kathrine Haddock At [facility] in [place], [place] and lung transplant team there

## 2014-08-15 NOTE — Progress Notes (Signed)
Subjective:    Patient ID: Dale [name], male    DOB: Apr 06, 1946, 69 y.o.   MRN: [id number]  HPI   #IPF   -biopsy proven - Nov 2014 UIP (CT now c/with UIP)   #Progress PFT FVC fev1 ratio BD fev1 TLC DLCO Walk test 185 feet x 3 laps CT Rx  Bx weight  Oct 2014/Nov 2014 2.6L       Possible UIP  UIP nov 2014   March 2015 x      ono - desat but he returned o2  Esbriet + Rehab + No Ofev due to anticoag    Aug 2015 2.47L/54% cough        esbiet+ Increased cough   Oct 2015 2.6 L/63% 2.2 L/71%   3.8 L/58% Could not do       Nov/dec 2015  2.28 L/58% 1.8 L/56% 79/101%    Did not desat Slight progression esbruet + Worsening cough. Dc ace inhjbitor   Jan 2016       Desat +  2nd lap  esbriet + Severe cough worse, more dyspenic   06/01/2014 - [facility]  2.29L/56% - pre [facility] [place]  ... 2.28L/57% @ vitalograph 1.8L/69% vitalograph    12.9/45% @    esbrriet +  +  Screening visit [facility] study  FAiled screening due to CT now c/w UIP    07/10/2014 - rsearch afferent cough  1.83L/44% (severe cough) 1.51/47% (severe cough)       Screen failed due to long Qtc    08/15/2014  1/22L/29% (severe cough)      desat at rest per wife but manages with 2L    Estimated body mass index is 29.78 kg/(m^2) as calculated from the following:   Height as of this encounter: 5\' 7"  (1.702 m).   Weight as of this encounter: 190 lb 3.2 oz (86.274 kg).       OV 08/15/2014  Chief Complaint  Patient presents with  . Follow-up    Pt stated his breathing has worsened. Pt c/o increase in SOB, prod cough with white mucus, and chest pain from coughing.       Follow-up idiopathic pulmonary fibrosis and chronic respiratory failure  Last seen January 2016 regular visit. Since then he has had progressive cough and shortness of breath. Cough is pretty clearly severe. He has had multiple episodes of cough during the day particularly early in the morning, after shower and once at night where it makes him  extremely dyspneic and fatigued and the paroxysmal extremely severe and causes muscular skeletal chest pain. He spends most of the day now fatigued and sad entry despite oxygen use. Wife says that he now desaturates even at rest and request 2 L of oxygen. They have been declined transplant at [facility] but currently pursuing [facility], [place], [facility] [place] and [facility] in [place]. They are aware that given his cardiac issues and weight that it is a low probability that he will be a transplant candidate. Today in the office he could not do spirometry properly because of severe cough. Tussionex not helping cough. Gabapentin in the past did not help cough. Vicodin helping cough somewhat   has a past medical history of Pure hyperglyceridemia; S/P aortic valve replacement; Other left bundle branch block; Other dyspnea and respiratory abnormality; Obesity, unspecified; Unspecified arthropathy, lower leg; Internal hemorrhoids without mention of complication (57/05/7791); Heart murmur; Pulmonary fibrosis; GERD (gastroesophageal reflux disease); Hemorrhoids; Arthritis; and  Cellulitis of hand, left.   reports that he quit smoking about 41 years ago. His smoking use included Cigarettes. He has a 15 pack-year smoking history. He has never used smokeless tobacco.  Past Surgical History  Procedure Laterality Date  . Circumcision  05/01/2006  . Shoulder arthroscopy  11/14/2004    Left shoulder  . Pacemaker removal  09/03/00    followed by removal of both atreal and ventricular pacing leads in a  patient who had evidence of pacemaker lead crush as well as pocket infection  . Aortic valve replacement    . Right knee    . Joint replacement Right     partial  . Video assisted thoracoscopy Right 04/05/2013    Procedure: RIGHT VIDEO ASSISTED THORACOSCOPY,RIGHT MIDDLE LOBE & RIGHT UPPER LOBE LUNG BIOPSY;  Surgeon: Ivin Poot, MD;  Location: [place];  Service:  Thoracic;  Laterality: Right;    Allergies  Allergen Reactions  . Penicillins Swelling and Rash    Immunization History  Administered Date(s) Administered  . Influenza Split 03/13/2014  . Influenza,inj,Quad PF,36+ Mos 03/16/2013  . Pneumococcal Polysaccharide-23 04/09/2013    Family History  Problem Relation Age of Onset  . Uterine cancer Mother   . Diabetes Sister      Current outpatient prescriptions:  .  aspirin 81 MG tablet, Take 81 mg by mouth daily. , Disp: , Rfl:  .  atorvastatin (LIPITOR) 80 MG tablet, Take 1 tablet (80 mg total) by mouth daily., Disp: 90 tablet, Rfl: 3 .  celecoxib (CELEBREX) 200 MG capsule, Take 1 capsule (200 mg total) by mouth daily., Disp: 90 capsule, Rfl: 3 .  chlorpheniramine-HYDROcodone (TUSSIONEX PENNKINETIC ER) 10-8 MG/5ML LQCR, Take 5 mLs by mouth 2 (two) times daily., Disp: 115 mL, Rfl: 0 .  docusate sodium (COLACE) 100 MG capsule, Take 200 mg by mouth daily., Disp: , Rfl:  .  fluticasone (FLONASE) 50 MCG/ACT nasal spray, Place 2 sprays into both nostrils daily., Disp: 16 g, Rfl: 2 .  HYDROcodone-acetaminophen (NORCO) 10-325 MG per tablet, Take 2 tablets by mouth daily as needed., Disp: 60 tablet, Rfl: 0 .  losartan-hydrochlorothiazide (HYZAAR) 50-12.5 MG per tablet, Take 1 tablet by mouth daily., Disp: 90 tablet, Rfl: 3 .  multivitamin (THERAGRAN) per tablet, Take 1 tablet by mouth daily. , Disp: , Rfl:  .  omeprazole (PRILOSEC) 20 MG capsule, Take 1 capsule (20 mg total) by mouth daily. (Patient taking differently: Take 40 mg by mouth daily. ), Disp: 30 capsule, Rfl: 11 .  Pirfenidone (ESBRIET) 267 MG CAPS, Take 3 capsules by mouth 3 (three) times daily., Disp: 270 capsule, Rfl: 3 .  VENTOLIN HFA 108 (90 BASE) MCG/ACT inhaler, INHALE 2 PUFFS INTO THE LUNGS EVERY 6 HOURS AS NEEDED FOR WHEEZING., Disp: 18 each, Rfl: 2 .  Vortioxetine HBr 10 MG TABS, Take 1 tablet by mouth daily., Disp: , Rfl:  .  warfarin (COUMADIN) 5 MG tablet, Take 1.5  tablets (7.5 mg total) by mouth daily. Take 7.5 MG DAILY TILL pt/inr NEXT CHECK, Disp: 30 tablet, Rfl: 0    Review of Systems  Constitutional: Negative for fever and unexpected weight change.  HENT: Positive for sore throat. Negative for congestion, dental problem, ear pain, nosebleeds, postnasal drip, rhinorrhea, sinus pressure, sneezing and trouble swallowing.   Eyes: Negative for redness and itching.  Respiratory: Positive for cough and shortness of breath. Negative for chest tightness and wheezing.   Cardiovascular: Positive for chest pain. Negative for palpitations and leg swelling.  Gastrointestinal: Positive for nausea and vomiting.  Genitourinary: Negative for dysuria.  Musculoskeletal: Negative for joint swelling.  Skin: Negative for rash.  Neurological: Negative for headaches.  Hematological: Does not bruise/bleed easily.  Psychiatric/Behavioral: Negative for dysphoric mood. The patient is not nervous/anxious.        Objective:   Physical Exam  Constitutional: He is oriented to person, place, and time. He appears well-developed and well-nourished. No distress.  HENT:  Head: Normocephalic and atraumatic.  Right Ear: External ear normal.  Left Ear: External ear normal.  Mouth/Throat: Oropharynx is clear and moist. No oropharyngeal exudate.  Coughs severely intermittently and then gets tachypneic throughout the exam  Eyes: Conjunctivae and EOM are normal. Pupils are equal, round, and reactive to light. Right eye exhibits no discharge. Left eye exhibits no discharge. No scleral icterus.  Neck: Normal range of motion. Neck supple. No JVD present. No tracheal deviation present. No thyromegaly present.  Cardiovascular: Normal rate, regular rhythm and intact distal pulses.  Exam reveals no gallop and no friction rub.   No murmur heard. Pulmonary/Chest: Effort normal. No respiratory distress. He has no wheezes. He has rales. He exhibits no tenderness.  Bilateral extensive crackles    Abdominal: Soft. Bowel sounds are normal. He exhibits no distension and no mass. There is no tenderness. There is no rebound and no guarding.  Musculoskeletal: Normal range of motion. He exhibits no edema or tenderness.  Lymphadenopathy:    He has no cervical adenopathy.  Neurological: He is alert and oriented to person, place, and time. He has normal reflexes. No cranial nerve deficit. Coordination normal.  Skin: Skin is warm and dry. No rash noted. He is not diaphoretic. No erythema. No pallor.  Psychiatric: He has a normal mood and affect. His behavior is normal. Judgment and thought content normal.  Nursing note and vitals reviewed.   Filed Vitals:   08/15/14 0925  BP: 178/88  Pulse: 77  Height: 5\' 7"  (1.702 m)  Weight: 190 lb 3.2 oz (86.274 kg)  SpO2: 92%   Estimated body mass index is 29.78 kg/(m^2) as calculated from the following:   Height as of this encounter: 5\' 7"  (1.702 m).   Weight as of this encounter: 190 lb 3.2 oz (86.274 kg).       Assessment & Plan:     ICD-9-CM ICD-10-CM   1. IPF (idiopathic pulmonary fibrosis) 516.31 J84.112 Spirometry with Graph  2. Chronic respiratory failure with hypoxia 518.83 J96.11    799.02      IPF is progressively worfse and is severe now despite Esbriet  - risk for further worsening and death is high Change Esbriet to Ofev - accept bleeding risk in an effort to stop disease progression Hold off any steroid trial for now Support transplant workup at [facility] and [place]  - let us be optimistic but also realistic For symptom mgmt  - cough and shortness of breath  - continue tussionex and vicodin  as needed for cough  - but step up therapy to morphine 5mg  syrup 4 times a day as needed  - start slowly and escalate  - use o2 - to keep pulse ox > 86% - in future will wean off tussionex and vicodin   - if symptoms worsen - please call and we need to support with hospice - No more PFT check due to cough  Wife and he in  tears so code status etc. Could not be addressed  Followup  4-6 weeks or sooner  if needed  No more PFT test for you Will send note to [facility]  -Dr Kathrine Haddock and colleagues  > 50% of this > 25 min visit spent in face to face counseling or coordination of care (15 min visit converted to 25 min)   Dr. Brand Males, M.D., [facility].C.P Pulmonary and Critical Care Medicine Staff Physician [facility] Pager: [phone], If no answer or between  15:00h - 7:00h: call [phone]  08/15/2014 10:01 AM

## 2014-08-16 NOTE — Telephone Encounter (Signed)
Note sent to Dr. Kathrine Haddock at [facility] in [place], [place] ... Phone number: [phone] - fax number: [phone].  Nothing further needed.

## 2014-08-22 ENCOUNTER — Encounter: Payer: Self-pay | Admitting: Internal Medicine

## 2014-08-22 ENCOUNTER — Other Ambulatory Visit: Payer: Self-pay | Admitting: Internal Medicine

## 2014-08-22 ENCOUNTER — Telehealth: Payer: Self-pay | Admitting: Internal Medicine

## 2014-08-22 NOTE — Telephone Encounter (Signed)
Is fine to give refill on this

## 2014-08-22 NOTE — Telephone Encounter (Signed)
MR please advise on refill. Thanks.

## 2014-08-22 NOTE — Telephone Encounter (Signed)
Pt last had tussionex refilled 08/02/14 #115 ML 1 tsp BID Please advise on refill MR? thanks

## 2014-08-23 MED ORDER — HYDROCOD POLST-CHLORPHEN POLST 10-8 MG/5ML PO LQCR: 5.0000 mL | Freq: Two times a day (BID) | ORAL | Status: DC

## 2014-08-23 NOTE — Telephone Encounter (Signed)
Patients RX printed and given to MR to sign.  Patient's wife notified that Rx will be available for pick up this afternoon after MR signs it.    To [name] for follow up - Given to [place]

## 2014-08-24 NOTE — Telephone Encounter (Signed)
Rx and OV note have been given to [place] to give to pt's nephew. When I went up front to give the envelopes to him he was not up front.

## 2014-08-24 NOTE — Telephone Encounter (Signed)
Signed rx placed up front for pick up. Pts wife aware and stated she will pick it up 08/24/2014. Nothing further needed.

## 2014-08-24 NOTE — Telephone Encounter (Signed)
PATIENT'S [employer] LOBBY is here to pick up RX and a letter for the patient to take to [facility].  The nephew states the patient's wife was told yesterday it was ready to pick up.

## 2014-08-25 ENCOUNTER — Telehealth: Payer: Self-pay | Admitting: Internal Medicine

## 2014-08-25 NOTE — Telephone Encounter (Signed)
Received PA for Ofev. Form completed and faxed back to [facility] at [phone]. Will await decision.

## 2014-08-25 NOTE — Telephone Encounter (Signed)
Spoke with Dale [name] at [facility], answered additional questions re: PA for Ofev.   [employer] member ID: [id number] We will be contacted when a decision is made.

## 2014-08-28 ENCOUNTER — Encounter: Payer: Self-pay | Admitting: Internal Medicine

## 2014-08-30 ENCOUNTER — Telehealth: Payer: Self-pay | Admitting: Internal Medicine

## 2014-08-30 NOTE — Telephone Encounter (Signed)
Called and spoke to [place] with specialty pharmacy. Informed Steffanie Dunn pt is switching from Esbriet to [place] verbalized understanding and denied any further questions or concerns at this time.

## 2014-08-30 NOTE — Telephone Encounter (Signed)
Dale [name]: Have you seen anything regarding this patient's PA for Esbriet?

## 2014-08-31 ENCOUNTER — Telehealth: Payer: Self-pay | Admitting: Internal Medicine

## 2014-08-31 ENCOUNTER — Encounter: Payer: Self-pay | Admitting: Internal Medicine

## 2014-08-31 NOTE — Telephone Encounter (Signed)
[name] - are you familiar with this?  I see that the medication has been switched from [place] to [facility], has that been approved?  Please advise.

## 2014-08-31 NOTE — Telephone Encounter (Signed)
Called and spoke to [place] and was informed pt's Ofev was denied. The denial was because the pt's FVC was less than 50, which does not meet the criteria for approval.  MR please advise if you would like to appeal.  Katherine Roan stated to appeal will need to send supporting information to fax number: [phone].

## 2014-08-31 NOTE — Telephone Encounter (Signed)
Wow, they are all declining and being very strict. Letter to state  - patient has IPF confirmed on biopsy. He has been on esbriet and has tolerated it but is declining fast and is progressive despite esbriet. Therefore, would like to switch with hope of stabilizing disease. He is on anticoagulation but we understand the bleeding risk and patient has accepted that risk as well. His one year expected mortality is very high and we hope you would look at this application favorably

## 2014-08-31 NOTE — Telephone Encounter (Signed)
Called and spoke to pt's wife. Pt currently has only a few more days of Esbriet. Pt is leaving for [facility]) on 4/23. Called [facility] and pt's Esbriet will need a PA and they do not allow a 30 day supply to bridge the pt to other therapy. Pt will be without Esbriet while in [place].   MR please advise on recs. Thanks.

## 2014-08-31 NOTE — Telephone Encounter (Signed)
Patient should continue on esbriet though I am not sure is making a difference for him anymore. Do the prior auth - I am in hospital all day tomorrow and if you need help I can help. IF they are unable to get it before they leave is fine - it acts slow over months so trip to [place] without it wont make a difference

## 2014-08-31 NOTE — Telephone Encounter (Signed)
Caryl Pina, have you received a decision on pt's Ofev? Thanks.

## 2014-08-31 NOTE — Telephone Encounter (Signed)
Pt wife aware of rec's  Aware that we will contact her once we know the outcome of PA Will send to [facility] to follow up as she is doing the PA.

## 2014-08-31 NOTE — Telephone Encounter (Signed)
PA denied. Please see phone note from 4/15. Will sign off.

## 2014-08-31 NOTE — Telephone Encounter (Signed)
Disregard previous message-MR pt.   No update yet on pt's Ofev.

## 2014-08-31 NOTE — Telephone Encounter (Signed)
Will close this e-mail as this has already be taken care of Call Documentation      Virl Cagey, CMA at 08/31/2014 5:19 PM     Status: Signed       Expand All Collapse All   Pt wife aware of rec's  Aware that we will contact her once we know the outcome of PA Will send to [place] to follow up as she is doing the PA.             Brand Males, MD at 08/31/2014 5:16 PM     Status: Signed       Expand All Collapse All   Patient should continue on esbriet though I am not sure is making a difference for him anymore. Do the prior auth - I am in hospital all day tomorrow and if you need help I can help. IF they are unable to get it before they leave is fine - it acts slow over months so trip to [place] without it wont make a difference            Maurice March, RN at 08/31/2014 5:08 PM     Status: Signed       Expand All Collapse All   Called and spoke to pt's wife. Pt currently has only a few more days of Esbriet. Pt is leaving for [facility]) on 4/23. Called [facility] and pt's Esbriet will need a PA and they do not allow a 30 day supply to bridge the pt to other therapy. Pt will be without Esbriet while in [place].   MR please advise on recs. Thanks.             Glean Hess, CMA at 08/31/2014 12:07 PM     Status: Signed       Expand All Collapse All   [name] - are you familiar with this? I see that the medication has been switched from [place] to [facility], has that been approved? Please advise

## 2014-09-01 NOTE — Telephone Encounter (Signed)
Spoke to [place].  Letter is awaiting signature to be faxed.  Nothing further needed.   (see TE 09/01/14)

## 2014-09-01 NOTE — Telephone Encounter (Signed)
Larene Beach at [facility].  Appeal has been received & has been sent to analyst. There is a 7 day turn around time - [phone]

## 2014-09-01 NOTE — Telephone Encounter (Signed)
Letter, supporting documents, and patient information faxed to appeal department at [phone]. Will await decision.

## 2014-09-01 NOTE — Telephone Encounter (Signed)
Letter drafted and given to [facility] to send (per her request)  To [name].

## 2014-09-04 NOTE — Telephone Encounter (Signed)
PA completed and faxed back to insurance. Will await decision.

## 2014-09-05 ENCOUNTER — Telehealth: Payer: Self-pay | Admitting: Cardiovascular Disease

## 2014-09-05 ENCOUNTER — Telehealth: Payer: Self-pay | Admitting: Internal Medicine

## 2014-09-05 NOTE — Telephone Encounter (Signed)
Why? He was already on it. Is just a refill. Can you please have [name] talk to the rep Malena Edman

## 2014-09-05 NOTE — Telephone Encounter (Signed)
New Message       Pt's wife calling stating that they are at the [facility] and need the pt's most recent CT and Echo results, Fax [phone] Attn: Romie Minus. Please call pt's wife back.

## 2014-09-05 NOTE — Telephone Encounter (Signed)
REQUESTED  RECORDS  FAXED PER PT'S  REQUEST ALSO  DR Johnsie Cancel SPOKE WITH   PT  AND  PT'S  WIFE .Adonis Housekeeper

## 2014-09-05 NOTE — Telephone Encounter (Signed)
Forwarding to MR as FYI.  

## 2014-09-06 NOTE — Telephone Encounter (Signed)
LMTCB for Jessica at [phone].

## 2014-09-06 NOTE — Telephone Encounter (Signed)
Just get  A urine nicotine level when he returns from [facility]

## 2014-09-06 NOTE — Telephone Encounter (Signed)
PA denied d/t pt not being biochemically test as a nonsmoker. Please see phone note from 4/26.

## 2014-09-06 NOTE — Telephone Encounter (Signed)
Dale [name] called back. Pt's case will need to be appealed. Per Dale [name] this is a new feature-requesting biochemical testing to confirm the pt is a nonsmoker. Dale [name] stated they have had several denials because of this.   MR please advise if you want to appeal.

## 2014-09-06 NOTE — Telephone Encounter (Signed)
Called and spoke to [place]. Informed her of the denial and that pt has not been biochemically tested and how this may have affected his approval/denial. Janett Billow stated she will look further into this and call back. Will await call.

## 2014-09-07 NOTE — Telephone Encounter (Signed)
Pt was approved for OFEV. Is this test still needed? Esbriet appeal is no longer necessary d/t OFEV approval.   MR please advise.

## 2014-09-07 NOTE — Telephone Encounter (Signed)
Called and spoke to [place] at [place]. Appeal for Dale [name] has been overturned and pt has been approved till 08/25/2015. [place] at [phone] to update that the pt has been approved and they will contact pt to gather more income information to see what the monthly payment will be. Called and spoke Dale [name], pt's wife, and informed her of the approval and that [employer] will be contacting her. Dale [name] verbalized understanding and stated she would like MR to know the pt is still at the [facility] and they have found that his aortic valve is not properly functioning. Prior to finding the valve issue the pt was to undergo 2 weeks of testing to see if he could withstand transplant.   Will forward to MR as FYI.

## 2014-09-11 ENCOUNTER — Encounter: Payer: Self-pay | Admitting: Cardiovascular Disease

## 2014-09-11 NOTE — Telephone Encounter (Signed)
Hold off urine nicotine for esbriet

## 2014-09-11 NOTE — Telephone Encounter (Signed)
Nothing further needed at this time as pt has been approved for Ofev. Will sign off.

## 2014-09-12 ENCOUNTER — Telehealth: Payer: Self-pay | Admitting: Cardiovascular Disease

## 2014-09-12 NOTE — Telephone Encounter (Signed)
New message         Pt wife calling to find out when pt Cath will be scheduled

## 2014-09-12 NOTE — Telephone Encounter (Signed)
Called patient's wife and advised Dr. Johnsie Cancel working in hospital and his primary nurse is out of office all week.  Pt's wife states Dr. Johnsie Cancel and cardiologist from [facility] spoke and patient is to have a heart cath as part of pre op clearance for lung transplant.    Advised her I will forward to Dr. Johnsie Cancel and will contact her with response after triage hears back. She is appreciative for the call and help.

## 2014-09-13 ENCOUNTER — Telehealth: Payer: Self-pay | Admitting: Internal Medicine

## 2014-09-13 NOTE — Telephone Encounter (Signed)
Dr. Johnsie Cancel has communicated with patient via email regarding this issue

## 2014-09-13 NOTE — Telephone Encounter (Signed)
Yes, good idea, start 2 days post cath

## 2014-09-13 NOTE — Telephone Encounter (Signed)
Called and spoke to pt's wife, Dale [name]. Pt is a new start on Ofev and is scheduled to take his first dose today (5/4). Pt has an upcoming heart cath on 5/10. Should pt hold off on taking Ofev till after the procedure d/t potential bleeding risk. MR is currently out unavailable today. Will send to doc of day.   BQ please advise. Thanks.

## 2014-09-13 NOTE — Telephone Encounter (Signed)
lmomtcb x1 for [employer]

## 2014-09-13 NOTE — Telephone Encounter (Signed)
Pt wife returning call.Stanley A Dalton' °

## 2014-09-13 NOTE — Telephone Encounter (Signed)
Called and spoke to pt. Informed pt of the recs per BQ. Pt verbalized understanding and denied any further questions or concerns at this time.   Will send to MR as FYI.

## 2014-09-14 NOTE — Telephone Encounter (Signed)
Received message from Dr. Johnsie Cancel-  Patient scheduled for right and left cath 5/10 10:00 Told him to take last coumadin dose on Thursday and have INR checked Monday 5/9 If less than 2 would hospitalize for heparin night before cath Pleas arrange INR check Monday and call patient's wife to make sure we are all on same page.   Spoke with pt's wife.  Verified instructions and scheduled an appt to check his INR on 5/9 at 10:30.

## 2014-09-15 ENCOUNTER — Telehealth: Payer: Self-pay | Admitting: Internal Medicine

## 2014-09-15 NOTE — Telephone Encounter (Signed)
Per [name]... No appeal, patient is taking OFEV.  Notified Veronica at pharmacy. Nothing further needed.

## 2014-09-18 ENCOUNTER — Ambulatory Visit (INDEPENDENT_AMBULATORY_CARE_PROVIDER_SITE_OTHER): Payer: Medicare Other | Admitting: *Deleted

## 2014-09-18 ENCOUNTER — Inpatient Hospital Stay (HOSPITAL_COMMUNITY): Payer: Medicare Other

## 2014-09-18 ENCOUNTER — Inpatient Hospital Stay (HOSPITAL_COMMUNITY)
Admission: AD | Admit: 2014-09-18 | Discharge: 2014-09-20 | DRG: 189 | Disposition: A | Discharge status: Home / Self Care | Payer: Medicare Other | Source: Ambulatory Visit | Attending: Cardiovascular Disease | Admitting: Cardiovascular Disease

## 2014-09-18 ENCOUNTER — Encounter: Payer: Self-pay | Admitting: Internal Medicine

## 2014-09-18 ENCOUNTER — Ambulatory Visit: Payer: Medicare Other | Admitting: Cardiovascular Disease

## 2014-09-18 ENCOUNTER — Ambulatory Visit (INDEPENDENT_AMBULATORY_CARE_PROVIDER_SITE_OTHER): Payer: Medicare Other | Admitting: Internal Medicine

## 2014-09-18 VITALS — BP 136/82 | HR 97 | Ht 67.0 in | Wt 192.0 lb

## 2014-09-18 DIAGNOSIS — Z7951 Long term (current) use of inhaled steroids: Secondary | ICD-10-CM

## 2014-09-18 DIAGNOSIS — I447 Left bundle-branch block, unspecified: Secondary | ICD-10-CM | POA: Diagnosis present

## 2014-09-18 DIAGNOSIS — I2510 Atherosclerotic heart disease of native coronary artery without angina pectoris: Secondary | ICD-10-CM | POA: Diagnosis not present

## 2014-09-18 DIAGNOSIS — J9621 Acute and chronic respiratory failure with hypoxia: Secondary | ICD-10-CM | POA: Diagnosis present

## 2014-09-18 DIAGNOSIS — K219 Gastro-esophageal reflux disease without esophagitis: Secondary | ICD-10-CM | POA: Diagnosis present

## 2014-09-18 DIAGNOSIS — Z954 Presence of other heart-valve replacement: Secondary | ICD-10-CM

## 2014-09-18 DIAGNOSIS — R05 Cough: Secondary | ICD-10-CM | POA: Insufficient documentation

## 2014-09-18 DIAGNOSIS — J9611 Chronic respiratory failure with hypoxia: Secondary | ICD-10-CM | POA: Diagnosis not present

## 2014-09-18 DIAGNOSIS — J84112 Idiopathic pulmonary fibrosis: Secondary | ICD-10-CM | POA: Diagnosis not present

## 2014-09-18 DIAGNOSIS — M542 Cervicalgia: Secondary | ICD-10-CM | POA: Diagnosis present

## 2014-09-18 DIAGNOSIS — Z79899 Other long term (current) drug therapy: Secondary | ICD-10-CM

## 2014-09-18 DIAGNOSIS — Z7982 Long term (current) use of aspirin: Secondary | ICD-10-CM

## 2014-09-18 DIAGNOSIS — J849 Interstitial pulmonary disease, unspecified: Secondary | ICD-10-CM | POA: Diagnosis present

## 2014-09-18 DIAGNOSIS — M6248 Contracture of muscle, other site: Secondary | ICD-10-CM | POA: Diagnosis not present

## 2014-09-18 DIAGNOSIS — Z6826 Body mass index (BMI) 26.0-26.9, adult: Secondary | ICD-10-CM | POA: Diagnosis not present

## 2014-09-18 DIAGNOSIS — R0600 Dyspnea, unspecified: Secondary | ICD-10-CM

## 2014-09-18 DIAGNOSIS — J9620 Acute and chronic respiratory failure, unspecified whether with hypoxia or hypercapnia: Secondary | ICD-10-CM | POA: Diagnosis present

## 2014-09-18 DIAGNOSIS — Z5181 Encounter for therapeutic drug level monitoring: Secondary | ICD-10-CM | POA: Diagnosis not present

## 2014-09-18 DIAGNOSIS — R059 Cough, unspecified: Secondary | ICD-10-CM | POA: Insufficient documentation

## 2014-09-18 DIAGNOSIS — Z952 Presence of prosthetic heart valve: Secondary | ICD-10-CM

## 2014-09-18 DIAGNOSIS — I359 Nonrheumatic aortic valve disorder, unspecified: Secondary | ICD-10-CM | POA: Diagnosis not present

## 2014-09-18 DIAGNOSIS — R040 Epistaxis: Secondary | ICD-10-CM | POA: Insufficient documentation

## 2014-09-18 DIAGNOSIS — E669 Obesity, unspecified: Secondary | ICD-10-CM | POA: Diagnosis present

## 2014-09-18 DIAGNOSIS — Z87891 Personal history of nicotine dependence: Secondary | ICD-10-CM | POA: Diagnosis not present

## 2014-09-18 DIAGNOSIS — M62838 Other muscle spasm: Secondary | ICD-10-CM

## 2014-09-18 DIAGNOSIS — Z7901 Long term (current) use of anticoagulants: Secondary | ICD-10-CM

## 2014-09-18 LAB — CBC WITH DIFFERENTIAL/PLATELET
Basophils Absolute: 0 10*3/uL (ref 0.0–0.1)
Basophils Relative: 0 % (ref 0–1)
EOS PCT: 3 % (ref 0–5)
Eosinophils Absolute: 0.3 10*3/uL (ref 0.0–0.7)
HCT: 36 % — ABNORMAL LOW (ref 39.0–52.0)
Hemoglobin: 11.5 g/dL — ABNORMAL LOW (ref 13.0–17.0)
LYMPHS PCT: 13 % (ref 12–46)
Lymphs Abs: 1.4 10*3/uL (ref 0.7–4.0)
MCH: 27.4 pg (ref 26.0–34.0)
MCHC: 31.9 g/dL (ref 30.0–36.0)
MCV: 85.9 fL (ref 78.0–100.0)
MONO ABS: 1 10*3/uL (ref 0.1–1.0)
Monocytes Relative: 9 % (ref 3–12)
Neutro Abs: 8.3 10*3/uL — ABNORMAL HIGH (ref 1.7–7.7)
Neutrophils Relative %: 75 % (ref 43–77)
Platelets: 264 10*3/uL (ref 150–400)
RBC: 4.19 MIL/uL — ABNORMAL LOW (ref 4.22–5.81)
RDW: 13.6 % (ref 11.5–15.5)
WBC: 11.1 10*3/uL — ABNORMAL HIGH (ref 4.0–10.5)

## 2014-09-18 LAB — COMPREHENSIVE METABOLIC PANEL
ALK PHOS: 66 U/L (ref 38–126)
ALT: 21 U/L (ref 17–63)
AST: 28 U/L (ref 15–41)
Albumin: 3.3 g/dL — ABNORMAL LOW (ref 3.5–5.0)
Anion gap: 7 (ref 5–15)
BUN: 8 mg/dL (ref 6–20)
CHLORIDE: 101 mmol/L (ref 101–111)
CO2: 30 mmol/L (ref 22–32)
Calcium: 8.7 mg/dL — ABNORMAL LOW (ref 8.9–10.3)
Creatinine, Ser: 0.62 mg/dL (ref 0.61–1.24)
GLUCOSE: 88 mg/dL (ref 70–99)
Potassium: 3.6 mmol/L (ref 3.5–5.1)
Sodium: 138 mmol/L (ref 135–145)
TOTAL PROTEIN: 6.9 g/dL (ref 6.5–8.1)
Total Bilirubin: 1 mg/dL (ref 0.3–1.2)

## 2014-09-18 LAB — APTT: aPTT: 30 seconds (ref 24–37)

## 2014-09-18 LAB — POCT I-STAT 3, ART BLOOD GAS (G3+)
Acid-Base Excess: 3 mmol/L — ABNORMAL HIGH (ref 0.0–2.0)
BICARBONATE: 27.9 meq/L — AB (ref 20.0–24.0)
O2 SAT: 96 %
PCO2 ART: 42.6 mmHg (ref 35.0–45.0)
Patient temperature: 98.6
TCO2: 29 mmol/L (ref 0–100)
pH, Arterial: 7.425 (ref 7.350–7.450)
pO2, Arterial: 80 mmHg (ref 80.0–100.0)

## 2014-09-18 LAB — POCT INR: INR: 1.1

## 2014-09-18 LAB — HEPARIN LEVEL (UNFRACTIONATED): Heparin Unfractionated: <0.1 IU/mL — ABNORMAL LOW (ref 0.30–0.70)

## 2014-09-18 LAB — BRAIN NATRIURETIC PEPTIDE: B Natriuretic Peptide: 267.8 pg/mL — ABNORMAL HIGH (ref 0.0–100.0)

## 2014-09-18 LAB — MRSA PCR SCREENING: MRSA BY PCR: NEGATIVE

## 2014-09-18 MED ORDER — ASPIRIN EC 81 MG PO TBEC
81.0000 mg | DELAYED_RELEASE_TABLET | Freq: Every day | ORAL | Status: DC
  Administered 2014-09-18 – 2014-09-20 (×3): 81 mg via ORAL
  Filled 2014-09-18 (×3): qty 1

## 2014-09-18 MED ORDER — LOSARTAN POTASSIUM 50 MG PO TABS
50.0000 mg | ORAL_TABLET | Freq: Every day | ORAL | Status: DC
  Administered 2014-09-19 – 2014-09-20 (×2): 50 mg via ORAL
  Filled 2014-09-18 (×2): qty 1

## 2014-09-18 MED ORDER — SODIUM CHLORIDE 0.9 % IV SOLN
INTRAVENOUS | Status: DC
  Administered 2014-09-19: 05:00:00 via INTRAVENOUS

## 2014-09-18 MED ORDER — ACETAMINOPHEN 325 MG PO TABS: 650.0000 mg | ORAL_TABLET | ORAL | Status: DC | PRN

## 2014-09-18 MED ORDER — FLUTICASONE PROPIONATE 50 MCG/ACT NA SUSP
2.0000 | Freq: Every day | NASAL | Status: DC
  Administered 2014-09-20: 2 via NASAL
  Filled 2014-09-18 (×2): qty 16

## 2014-09-18 MED ORDER — HEPARIN (PORCINE) IN NACL 100-0.45 UNIT/ML-% IJ SOLN
1400.0000 [IU]/h | INTRAMUSCULAR | Status: DC
  Administered 2014-09-18: 1200 [IU]/h via INTRAVENOUS
  Filled 2014-09-18 (×2): qty 250

## 2014-09-18 MED ORDER — ONDANSETRON HCL 4 MG/2ML IJ SOLN: 4.0000 mg | Freq: Four times a day (QID) | INTRAMUSCULAR | Status: DC | PRN

## 2014-09-18 MED ORDER — SODIUM CHLORIDE 0.9 % IJ SOLN
3.0000 mL | Freq: Two times a day (BID) | INTRAMUSCULAR | Status: DC
  Administered 2014-09-19: 3 mL via INTRAVENOUS

## 2014-09-18 MED ORDER — METHYLPREDNISOLONE SODIUM SUCC 40 MG IJ SOLR
40.0000 mg | Freq: Two times a day (BID) | INTRAMUSCULAR | Status: DC
  Administered 2014-09-18 – 2014-09-20 (×4): 40 mg via INTRAVENOUS
  Filled 2014-09-18 (×7): qty 1

## 2014-09-18 MED ORDER — ASPIRIN 81 MG PO CHEW
81.0000 mg | CHEWABLE_TABLET | ORAL | Status: AC
  Administered 2014-09-19: 81 mg via ORAL
  Filled 2014-09-18: qty 1

## 2014-09-18 MED ORDER — SODIUM CHLORIDE 0.9 % IV SOLN: 250.0000 mL | INTRAVENOUS | Status: DC | PRN

## 2014-09-18 MED ORDER — ALBUTEROL SULFATE (2.5 MG/3ML) 0.083% IN NEBU
3.0000 mL | INHALATION_SOLUTION | RESPIRATORY_TRACT | Status: DC | PRN
  Filled 2014-09-18: qty 3

## 2014-09-18 MED ORDER — INSULIN ASPART 100 UNIT/ML ~~LOC~~ SOLN
0.0000 [IU] | Freq: Three times a day (TID) | SUBCUTANEOUS | Status: DC
  Administered 2014-09-19: 2 [IU] via SUBCUTANEOUS
  Administered 2014-09-19: 1 [IU] via SUBCUTANEOUS

## 2014-09-18 MED ORDER — DOCUSATE SODIUM 100 MG PO CAPS
200.0000 mg | ORAL_CAPSULE | Freq: Every day | ORAL | Status: DC
  Administered 2014-09-19: 200 mg via ORAL
  Filled 2014-09-18 (×3): qty 2

## 2014-09-18 MED ORDER — HYDROCODONE-ACETAMINOPHEN 10-325 MG PO TABS
2.0000 | ORAL_TABLET | ORAL | Status: DC | PRN
  Administered 2014-09-18 – 2014-09-19 (×3): 2 via ORAL
  Filled 2014-09-18 (×3): qty 2

## 2014-09-18 MED ORDER — VORTIOXETINE HBR 10 MG PO TABS
1.0000 | ORAL_TABLET | Freq: Every day | ORAL | Status: DC
  Administered 2014-09-19 – 2014-09-20 (×2): 10 mg via ORAL
  Filled 2014-09-18: qty 1

## 2014-09-18 MED ORDER — HYDROCHLOROTHIAZIDE 12.5 MG PO CAPS
12.5000 mg | ORAL_CAPSULE | Freq: Every day | ORAL | Status: DC
  Administered 2014-09-19 – 2014-09-20 (×2): 12.5 mg via ORAL
  Filled 2014-09-18 (×2): qty 1

## 2014-09-18 MED ORDER — ALBUTEROL SULFATE (2.5 MG/3ML) 0.083% IN NEBU: 3.0000 mL | INHALATION_SOLUTION | RESPIRATORY_TRACT | Status: DC

## 2014-09-18 MED ORDER — SODIUM CHLORIDE 0.9 % IJ SOLN: 3.0000 mL | INTRAMUSCULAR | Status: DC | PRN

## 2014-09-18 MED ORDER — PANTOPRAZOLE SODIUM 40 MG PO TBEC
40.0000 mg | DELAYED_RELEASE_TABLET | Freq: Every day | ORAL | Status: DC
  Administered 2014-09-19 – 2014-09-20 (×2): 40 mg via ORAL
  Filled 2014-09-18 (×2): qty 1

## 2014-09-18 MED ORDER — HEPARIN BOLUS VIA INFUSION
2000.0000 [IU] | Freq: Once | INTRAVENOUS | Status: AC
  Administered 2014-09-18: 2000 [IU] via INTRAVENOUS
  Filled 2014-09-18: qty 2000

## 2014-09-18 MED ORDER — LOSARTAN POTASSIUM-HCTZ 50-12.5 MG PO TABS: 1.0000 | ORAL_TABLET | Freq: Every day | ORAL | Status: DC

## 2014-09-18 MED ORDER — ALBUTEROL SULFATE (2.5 MG/3ML) 0.083% IN NEBU
2.5000 mg | INHALATION_SOLUTION | RESPIRATORY_TRACT | Status: DC
  Administered 2014-09-18 – 2014-09-20 (×12): 2.5 mg via RESPIRATORY_TRACT
  Filled 2014-09-18 (×11): qty 3

## 2014-09-18 MED ORDER — HYDROCOD POLST-CPM POLST ER 10-8 MG/5ML PO SUER: 5.0000 mL | Freq: Two times a day (BID) | ORAL | Status: DC | PRN

## 2014-09-18 MED ORDER — HYDROCODONE-ACETAMINOPHEN 10-325 MG PO TABS: 2.0000 | ORAL_TABLET | Freq: Every day | ORAL | Status: DC | PRN

## 2014-09-18 MED ORDER — ATORVASTATIN CALCIUM 80 MG PO TABS
80.0000 mg | ORAL_TABLET | Freq: Every day | ORAL | Status: DC
  Administered 2014-09-19 – 2014-09-20 (×2): 80 mg via ORAL
  Filled 2014-09-18 (×2): qty 1

## 2014-09-18 MED ORDER — SODIUM CHLORIDE 0.9 % IJ SOLN
3.0000 mL | Freq: Two times a day (BID) | INTRAMUSCULAR | Status: DC
  Administered 2014-09-18: 3 mL via INTRAVENOUS
  Administered 2014-09-18: 10 mL via INTRAVENOUS
  Administered 2014-09-19 (×2): 3 mL via INTRAVENOUS

## 2014-09-18 MED ORDER — CELECOXIB 200 MG PO CAPS
200.0000 mg | ORAL_CAPSULE | Freq: Every day | ORAL | Status: DC
  Administered 2014-09-19 – 2014-09-20 (×2): 200 mg via ORAL
  Filled 2014-09-18 (×2): qty 1

## 2014-09-18 NOTE — Progress Notes (Signed)
Discussed care with Dr. Johnsie Cancel.  Pt to be admitted to CCU for respiratory failure and heparin drip.

## 2014-09-18 NOTE — Progress Notes (Signed)
    Please see Dr. Johnsie Cancel note for details. Assisted with orders.  Spoke to pt and wife.  Candee Furbish, MD

## 2014-09-18 NOTE — Patient Instructions (Addendum)
ICD-9-CM ICD-10-CM   1. IPF (idiopathic pulmonary fibrosis) 516.31 J84.112   2. Chronic respiratory failure with hypoxia 518.83 J96.11    799.02    3. Neck muscle spasm 728.85 M62.48    #IPF with chronic resp failure IPF appears progressed and is in severe state Do not start Ofev yet Use o2 2-4L at rest, 8-10L with exertion Keep up with efforts to get transplant at [facility]  - cardiac cath next 24h; ensure you can lie flat Any worsening go to ER  #NEck spasm - per PCP [name],RICHARD M, MD - can try massage (gentle)   #Followup  1 month if needed; keep it open on schedule.  Can cancel if neeeded

## 2014-09-18 NOTE — Consult Note (Signed)
Name: Dale [name] MRN: [id number] DOB: 1946-04-08    ADMISSION DATE:  09/18/2014 CONSULTATION DATE:  09/18/2014  REFERRING MD :  Johnsie Cancel  CHIEF COMPLAINT:  Dyspnea  BRIEF PATIENT DESCRIPTION: 69 y.o. M with hx of severe IPF (currently underoing transplant eval at [facility] in [facility]), brought to [facility] 09/18/14 in preparation for left and right heart cath 09/19/14. PCCM consulted for assistance with management of his chronic dyspnea.  SIGNIFICANT EVENTS  5/9 - admitted 5/10 - planned left and right heart cath  STUDIES:  None   HISTORY OF PRESENT ILLNESS:  Dale [name] is a 69 y.o. M with PMH as outlined below including IPF with biopsy proven UIP in 03/2013. He is followed by Dr. Chase Caller as an outpatient.  He was initially evaluated for transplant at [facility] but was denied due to his BMI.  Family then seeked transplant at [facility] in [place], [facility] in [place], and [facility] in [place].  Pt is currently being evaluated  and considered at [facility] for both heart and lung transplant; however, lung is a priority due to his severe ILD.  Transplant team at [facility] requesting right and left heart cath.  He was seen by Dr. Johnsie Cancel on 09/18/14 and admitted to [facility] in hopes of performing cardiac cath 09/19/14.  PCCM was consulted for assistance with his IPF management pre and post procedure.  He is chronically on anywhere from 2 - 4 L O2 at rest and 4 - 10L with exertion.  He was seen by Dr. Chase Caller on 09/18/14 and had during office visit, reported progressive cough associated with his usual dyspnea.  He was unable to perform spirometry due to his severe cough.  He was started on hydrocodone for cough.  Family declined any morphine for cough and dyspnea after advice from [facility].   In addition, he was scheduled to begin Ofev in hopes of ceasing disease progression; however, start date has been postponed until cardiac cath is completed due to theoretical risk of increased bleeding  with Ofev.   PAST MEDICAL HISTORY :   has a past medical history of Pure hyperglyceridemia; S/P aortic valve replacement; Other left bundle branch block; Other dyspnea and respiratory abnormality; Obesity, unspecified; Unspecified arthropathy, lower leg; Internal hemorrhoids without mention of complication ([id number]); Heart murmur; Pulmonary fibrosis; GERD (gastroesophageal reflux disease); Hemorrhoids; Arthritis; and Cellulitis of hand, left.  has past surgical history that includes Circumcision (05/01/2006); Shoulder arthroscopy (11/14/2004); Pacemaker removal (09/03/00); Aortic valve replacement; right knee; Joint replacement (Right); and Video assisted thoracoscopy (Right, 04/05/2013). Prior to Admission medications   Medication Sig Start Date End Date Taking? Authorizing Provider  atorvastatin (LIPITOR) 80 MG tablet Take 1 tablet (80 mg total) by mouth daily. 04/25/14  Yes Josue Hector, MD  celecoxib (CELEBREX) 200 MG capsule Take 1 capsule (200 mg total) by mouth daily. 04/25/14  Yes Josue Hector, MD  chlorpheniramine-HYDROcodone [facility] PENNKINETIC ER) 10-8 MG/5ML SUER Take 5 mLs by mouth 2 (two) times daily as needed for cough. 09/18/14  Yes Brand Males, MD  docusate sodium (COLACE) 100 MG capsule Take 200 mg by mouth at bedtime.    Yes Historical Provider, MD  fluticasone (FLONASE) 50 MCG/ACT nasal spray Place 2 sprays into both nostrils daily. 05/24/14  Yes Brand Males, MD  HYDROcodone-acetaminophen (NORCO) 10-325 MG per tablet Take 2 tablets by mouth daily as needed. Patient taking differently: Take 1 tablet by mouth every 4 (four) hours as needed (pain).  09/18/14  Yes Brand Males, MD  losartan-hydrochlorothiazide (HYZAAR) 50-12.5 MG per tablet Take 1 tablet by mouth daily. 04/25/14  Yes Josue Hector, MD  Multiple Vitamin (MULTIVITAMIN WITH MINERALS) TABS tablet Take 1 tablet by mouth daily.   Yes Historical Provider, MD  omeprazole (PRILOSEC) 20 MG capsule Take 1  capsule (20 mg total) by mouth daily. Patient taking differently: Take 40 mg by mouth daily.  03/21/14  Yes Brand Males, MD  VENTOLIN HFA 108 (90 BASE) MCG/ACT inhaler INHALE 2 PUFFS INTO THE LUNGS EVERY 6 HOURS AS NEEDED FOR WHEEZING. 04/24/14  Yes Brand Males, MD  Vortioxetine HBr 10 MG TABS Take 10 mg by mouth daily. BRINTELLIX   Yes Historical Provider, MD  aspirin EC 81 MG tablet Take 81 mg by mouth daily.    Historical Provider, MD  chlorpheniramine-HYDROcodone (TUSSIONEX PENNKINETIC ER) 10-8 MG/5ML LQCR Take 5 mLs by mouth 2 (two) times daily. Patient not taking: Reported on 09/18/2014 08/23/14   Brand Males, MD  morphine 10 MG/5ML solution Take 2.5 mLs (5 mg total) by mouth 4 (four) times daily as needed (SOB, cough and IPF). Patient not taking: Reported on 09/18/2014 08/15/14   Brand Males, MD  Nintedanib (OFEV) 150 MG CAPS Take 150 mg by mouth 2 (two) times daily.    Historical Provider, MD  warfarin (COUMADIN) 10 MG tablet Take 5-10 mg by mouth daily. Take 2 tablets (10 mg) on Monday, Wednesday, Friday, take 1 tablet (5 mg) on Sunday, Tuesday, Thursday, Saturday    Historical Provider, MD  warfarin (COUMADIN) 5 MG tablet Take 1.5 tablets (7.5 mg total) by mouth daily. Take 7.5 MG DAILY TILL pt/inr NEXT CHECK Patient not taking: Reported on 09/18/2014 04/09/13   John Giovanni, PA-C   Allergies  Allergen Reactions  . Penicillins Swelling and Rash    FAMILY HISTORY:  family history includes Diabetes in his sister; Uterine cancer in his mother. SOCIAL HISTORY:  reports that he quit smoking about 41 years ago. His smoking use included Cigarettes. He has a 15 pack-year smoking history. He has never used smokeless tobacco. He reports that he drinks alcohol. He reports that he does not use illicit drugs.  REVIEW OF SYSTEMS:   All negative; except for those that are bolded, which indicate positives.  Constitutional: weight loss, weight gain, night sweats, fevers, chills,  fatigue, weakness.  HEENT: headaches, sore throat, sneezing, nasal congestion, post nasal drip, difficulty swallowing, tooth/dental problems, visual complaints, visual changes, ear aches. Neuro: difficulty with speech, weakness, numbness, ataxia. CV:  chest pain, orthopnea, PND, swelling in lower extremities, dizziness, palpitations, syncope.  Resp: cough, hemoptysis, dyspnea, wheezing. GI  heartburn, indigestion, abdominal pain, nausea, vomiting, diarrhea, constipation, change in bowel habits, loss of appetite, hematemesis, melena, hematochezia.  GU: dysuria, change in color of urine, urgency or frequency, flank pain, hematuria. MSK: joint pain or swelling, decreased range of motion. Psych: change in mood or affect, depression, anxiety, suicidal ideations, homicidal ideations. Skin: rash, itching, bruising.   SUBJECTIVE:  Currently not coughing.  Has been able to eat his full lunch without trouble.  Had neck pain earlier in the day following long drive back from [facility]; however, now feels that it is much improved.  VITAL SIGNS: Pulse Rate:  [97] 97 (05/09 0944) Resp:  [18-28] 23 (05/09 1300) BP: (136-171)/(70-90) 171/85 mmHg (05/09 1300) SpO2:  [92 %-100 %] 100 % (05/09 1300) Weight:  [84.4 kg (186 lb 1.1 oz)-87.091 kg (192 lb)] 84.4 kg (186 lb 1.1 oz) (05/09 1200)  PHYSICAL EXAMINATION: General: Chronically ill appearing male, resting in bed, in NAD. Neuro: A&O x 3, non-focal.  HEENT: [place]/AT. PERRL, sclerae anicteric. Cardiovascular: RRR, systolic click noted. Lungs: Respirations even and unlabored.  Faint rhonchi. Abdomen: BS x 4, soft, NT/ND.  Musculoskeletal: No gross deformities, no edema.  Skin: Intact, warm, no rashes.    No results for input(s): NA, K, CL, CO2, BUN, CREATININE, GLUCOSE in the last 168 hours. No results for input(s): HGB, HCT, WBC, PLT in the last 168 hours. No results found.  ASSESSMENT / PLAN:  Chronic hypoxic respiratory failure due to underlying severe  IPF Potential lung transplant candidate - currently being evaluated / considered at [facility] Plan: Continue supplemental O2 as needed to maintain SpO2 > 92%. Continue hydrocodone for cough and dyspnea. Albuterol PRN. Ofev on hold until 48 hrs post cath. Continue with planned left / right heart cath 09/19/14 as long as no issues overnight.   Montey Hora, [facility] Pager: [phone]  or [phone] 09/18/2014, 1:35 PM   STAFF NOTE: I, Merrie Roof, MD FACP have personally reviewed patient's available data, including medical history, events of note, physical examination and test results as part of my evaluation. I have discussed with resident/NP and other care providers such as pharmacist, RN and RRT. In addition, I personally evaluated patient and elicited key findings of: he feels he has had worsening sob over 2 weeks , especially with bending down, I did call and updated and discussed his case with Dr Chase Caller. Coarse BS crackles dry, possible has IPF flare, will attempt steroids for days period of time and dc if not responding in 48 hrs, pcxr now and in am , with steroids add ssi, fullyupdated wife, niece, and pt , extensive, lasix and maximziig neg balance per cards  Lavon Paganini. Titus Mould, MD, [place] Pgr: [facility] 09/18/2014 3:52 PM

## 2014-09-18 NOTE — Progress Notes (Signed)
Subjective:    Patient ID: Dale [name], male    DOB: [birth year], 69 y.o.   MRN: [id number]  HPI   #IPF   -biopsy proven - Nov 2014 UIP (CT now c/with UIP)   #Progress PFT FVC fev1 ratio BD fev1 TLC DLCO Walk test 185 feet x 3 laps CT Rx  Bx weight  Oct 2014/Nov 2014 2.6L       Possible UIP  UIP nov 2014   March 2015 x      ono - desat but he returned o2  Esbriet + Rehab + No Ofev due to anticoag    Aug 2015 2.47L/54% cough        esbiet+ Increased cough   Oct 2015 2.6 L/63% 2.2 L/71%   3.8 L/58% Could not do       Nov/dec 2015  2.28 L/58% 1.8 L/56% 79/101%    Did not desat Slight progression esbruet + Worsening cough. Dc ace inhjbitor   Jan 2016       Desat +  2nd lap  esbriet + Severe cough worse, more dyspenic   06/01/2014 - [facility]  2.29L/56% - pre [facility] [place]  ... 2.28L/57% @ vitalograph 1.8L/69% vitalograph    12.9/45% @ [place]   esbrriet +  +  Screening visit [facility] study  FAiled screening due to CT now c/w UIP    07/10/2014 - rsearch afferent cough  1.83L/44% (severe cough) 1.51/47% (severe cough)       Screen failed due to long Qtc    08/15/2014  1/22L/29% (severe cough)      desat at rest per wife but manages with 2L    Estimated body mass index is 29.78 kg/(m^2) as calculated from the following:   Height as of this encounter: 5\' 7"  (1.702 m).   Weight as of this encounter: 190 lb 3.2 oz (86.274 kg).       OV 08/15/2014  Chief Complaint  Patient presents with  . Follow-up    Pt stated his breathing has worsened. Pt c/o increase in SOB, prod cough with white mucus, and chest pain from coughing.       Follow-up idiopathic pulmonary fibrosis and chronic respiratory failure  Last seen January 2016 regular visit. Since then he has had progressive cough and shortness of breath. Cough is pretty clearly severe. He has had multiple episodes of cough during the day particularly early in the morning, after shower and once at night where it makes him  extremely dyspneic and fatigued and the paroxysmal extremely severe and causes muscular skeletal chest pain. He spends most of the day now fatigued and sad entry despite oxygen use. Wife says that he now desaturates even at rest and request 2 L of oxygen. They have been declined transplant at [facility] but currently pursuing [facility] [place] in [place] and [facility] in [place]. They are aware that given his cardiac issues and weight that it is a low probability that he will be a transplant candidate. Today in the office he could not do spirometry properly because of severe cough. Tussionex not helping cough. Gabapentin in the past did not help cough. Vicodin helping cough somewhat    OV 09/18/2014  Chief Complaint  Patient presents with  . Follow-up    Pt c/o increase in SOB. Pt has a cardiac cath on 5/10, pt is on hold with [facility] with transplant team till after cath.     Follow-up  idiopathic pulmonary fibrosis and chronic respiratory failure  Since last seeing a month ago he has been to [facility] in [place]. Wife tells me that [facility] gives most of the history because of his dyspnea is unable to talk] at [facility] did not think his BMI of 30 was a contraindication for transplant unlike [employer]. The overall thought he was a good transplant candidate but apparently much of the dyspnea and cough is felt to be related to his aortic valve disease. Therefore he is back in [place] and is due for a cardiac cath with his cardiologist today/tomorrow. Meanwhile his dyspnea has progressed his cough has progressed. He is visibly more dyspneic. He is also feeling a lot worse. They do admit that he is significantly worse but is still optimistic that they can find a Conservation officer, nature for transplant at [facility] in [place]. Transplant suitability depends on results of cardiac cath and subsequent return to [facility] and further testing  including psychological and financial testing at [facility]. Both heart and lung transference of being considered but with lung transplant a priority  Currently the do not want morphine for symptom relief. Do not [facility] hospice evaluation. They want refill  on the Hycodan /. Hydrocodone. Ofev specific therapy against IPF has arrived but they were not started due to potential bleeding risks especially with him for cardiac cath today/tomorrow    Review of Systems  Constitutional: Negative for fever and unexpected weight change.  HENT: Negative for congestion, dental problem, ear pain, nosebleeds, postnasal drip, rhinorrhea, sinus pressure, sneezing, sore throat and trouble swallowing.   Eyes: Negative for redness and itching.  Respiratory: Positive for cough and shortness of breath. Negative for chest tightness and wheezing.   Cardiovascular: Negative for palpitations and leg swelling.  Gastrointestinal: Negative for nausea and vomiting.  Genitourinary: Negative for dysuria.  Musculoskeletal: Negative for joint swelling.  Skin: Negative for rash.  Neurological: Negative for headaches.  Hematological: Does not bruise/bleed easily.  Psychiatric/Behavioral: Negative for dysphoric mood. The patient is not nervous/anxious.        Objective:   Physical Exam  Constitutional: He is oriented to person, place, and time. He appears well-developed and well-nourished. No distress.  HENT:  Head: Normocephalic and atraumatic.  Right Ear: External ear normal.  Left Ear: External ear normal.  Mouth/Throat: Oropharynx is clear and moist. No oropharyngeal exudate.  Nasal cannula oxygen on Neck muscle spasm Left trapezius tender  Eyes: Conjunctivae and EOM are normal. Pupils are equal, round, and reactive to light. Right eye exhibits no discharge. Left eye exhibits no discharge. No scleral icterus.  Neck: Normal range of motion. Neck supple. No JVD present. No tracheal deviation present. No thyromegaly  present.  Cardiovascular: Normal rate, regular rhythm and intact distal pulses.  Exam reveals no gallop and no friction rub.   No murmur heard. Pulmonary/Chest: He is in respiratory distress. He has no wheezes. He has rales. He exhibits no tenderness.  Class IV dyspnea Small lung volumes Tachypneic Using oxygen  Abdominal: Soft. Bowel sounds are normal. He exhibits no distension and no mass. There is no tenderness. There is no rebound and no guarding.  Musculoskeletal: Normal range of motion. He exhibits no edema or tenderness.  Lymphadenopathy:    He has no cervical adenopathy.  Neurological: He is alert and oriented to person, place, and time. He has normal reflexes. No cranial nerve deficit. Coordination normal.  Skin: Skin is warm and dry. No rash noted. He is not diaphoretic.  No erythema. No pallor.  Psychiatric:  Flat affect Wife doing most of the talking because of his dyspnea  Nursing note and vitals reviewed.   Filed Vitals:   09/18/14 0944  BP: 136/82  Pulse: 97  Height: 5\' 7"  (1.702 m)  Weight: 192 lb (87.091 kg)  SpO2: 92%    Estimated body mass index is 30.06 kg/(m^2) as calculated from the following:   Height as of this encounter: 5\' 7"  (1.702 m).   Weight as of this encounter: 192 lb (87.091 kg).       Assessment & Plan:     ICD-9-CM ICD-10-CM   1. IPF (idiopathic pulmonary fibrosis) 516.31 J84.112   2. Chronic respiratory failure with hypoxia 518.83 J96.11    799.02    3. Neck muscle spasm 728.85 M62.48     #IPF with chronic resp failure IPF appears progressed and is in severe state. Risk of dying in the next few weeks to few months is extremely high. At this point in time they're not interested in any hospice options  (not discussed this visit but body langugage is that they want to pursue transplant). Wife is aggressively pursuing transplant options. He will get hydrocodone for cough and shortness of breath. Last visit advised them morphine but [facility] is advised against it due to potential for transplant.. I prescribed Ofev specific therapy against idiopathic pulmonary fibrosis in an effort to stop disease progression. We are accepting theoretical bleeding risk with this drug. The drug has been only studied in mild to moderate disease and its efficacy of this advanced state is unknown and is probably not effective but it is a  desperate attempt to stop disease progression. Currently till he finishes cardiac cath starting OFev is on hold.   PLAN Hold Ofev til cath finished  Use o2 2-4L at rest, 8-10L with exertion Keep up with efforts to get transplant at [facility]  - cardiac cath next 24h; ensure you can lie flat Any worsening go to ER Use hydrocodone for cough and dyspnea relief  #NEck spasm - per PCP [name],RICHARD M, MD - can try massage (gentle)   #Followup  1 month if needed; keep it open on schedule.  Can cancel if neeeded   > 50% of this > 25 min visit spent in face to face counseling or coordination of care (15 min visit converted to 25 min)   Dr. Brand Males, M.D., [facility].C.P Pulmonary and Critical Care Medicine Staff Physician [facility] [facility] Pager: [phone], If no answer or between  15:00h - 7:00h: call [phone]  09/18/2014 10:11 AM

## 2014-09-18 NOTE — H&P (Signed)
Physician History and Physical     Patient ID: Dale [name] MRN: [id number] DOB/AGE: 06/16/1945 69 y.o. Admit date: 09/18/2014  Primary Care Physician: Maricela Curet, MD Primary Cardiologist:  Johnsie Cancel  Active Problems:   * No active hospital problems. *   HPI:   69 y.o. seen in coumadin clinic today at request of Simmie Davies.  He is s/p Probation officer AV in 1996.  Chronic LBBB.  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  Has been on escalating oxygen now requiring close to 10 L On exertion.  Increasing cough and dyspnea.  Wife took him to [facility] two weeks ago in [place] to be evaluated for Lung transplant.  They Were concerned about his heart and wanted right and left cath done.  They mentioned crossing his St Jude valve and I told them we would not do this In office today RR 30, some changes in MS.  Severe neck pain from long car ride back from [place] Unable to sleep.  INR subRx 1.1   Discussed hospitalization for respiratory failure, heparin and pain control  Personally I think he will end of as a palliative care patient but wife is still hoping to get him a lung transplant.  Discussed admission with our hospital service Admit to CCU  Will evaluate him in am to see if he is stable enough for right and left cath.    Review of systems complete and found to be negative unless listed above   Past Medical History  Diagnosis Date  . Pure hyperglyceridemia   . S/P aortic valve replacement   . Other left bundle branch block   . Other dyspnea and respiratory abnormality   . Obesity, unspecified   . Unspecified arthropathy, lower leg   . Internal hemorrhoids without mention of complication 32/67/1245    Colonoscopy   . Heart murmur   . Pulmonary fibrosis   . GERD (gastroesophageal reflux disease)   . Hemorrhoids   . Arthritis   . Cellulitis of hand, left     possible IV infiltration as source    Family History    Problem Relation Age of Onset  . Uterine cancer Mother   . Diabetes Sister     History   Social History  . Marital Status: Married    Spouse Name: N/A  . Number of Children: N/A  . Years of Education: N/A   Occupational History  . Refrigeration    Social History Main Topics  . Smoking status: Former Smoker -- 1.50 packs/day for 10 years    Types: Cigarettes    Quit date: 05/12/1973  . Smokeless tobacco: Never Used  . Alcohol Use: 0.0 oz/week     Comment: 2-3 beers occassionally  . Drug Use: No  . Sexual Activity: Not on file   Other Topics Concern  . Not on file   Social History Narrative    Past Surgical History  Procedure Laterality Date  . Circumcision  05/01/2006  . Shoulder arthroscopy  11/14/2004    Left shoulder  . Pacemaker removal  09/03/00    followed by removal of both atreal and ventricular pacing leads in a  patient who had evidence of pacemaker lead crush as well as pocket infection  . Aortic valve replacement    . Right knee    . Joint replacement Right     partial  . Video assisted thoracoscopy Right 04/05/2013  Procedure: RIGHT VIDEO ASSISTED THORACOSCOPY,RIGHT MIDDLE LOBE & RIGHT UPPER LOBE LUNG BIOPSY;  Surgeon: Ivin Poot, MD;  Location: [place];  Service: Thoracic;  Laterality: Right;     Prescriptions prior to admission  Medication Sig Dispense Refill Last Dose  . aspirin 81 MG tablet Take 81 mg by mouth daily.    Taking  . atorvastatin (LIPITOR) 80 MG tablet Take 1 tablet (80 mg total) by mouth daily. 90 tablet 3 Taking  . celecoxib (CELEBREX) 200 MG capsule Take 1 capsule (200 mg total) by mouth daily. 90 capsule 3 Taking  . chlorpheniramine-HYDROcodone (TUSSIONEX PENNKINETIC ER) 10-8 MG/5ML LQCR Take 5 mLs by mouth 2 (two) times daily. (Patient not taking: Reported on 09/18/2014) 115 mL 0 Not Taking  . chlorpheniramine-HYDROcodone (TUSSIONEX PENNKINETIC ER) 10-8 MG/5ML SUER Take 5 mLs by mouth 2 (two) times daily as needed for cough.  115 mL 0   . docusate sodium (COLACE) 100 MG capsule Take 200 mg by mouth daily.   Taking  . fluticasone (FLONASE) 50 MCG/ACT nasal spray Place 2 sprays into both nostrils daily. 16 g 2 Taking  . HYDROcodone-acetaminophen (NORCO) 10-325 MG per tablet Take 2 tablets by mouth daily as needed. 60 tablet 0   . losartan-hydrochlorothiazide (HYZAAR) 50-12.5 MG per tablet Take 1 tablet by mouth daily. 90 tablet 3 Taking  . morphine 10 MG/5ML solution Take 2.5 mLs (5 mg total) by mouth 4 (four) times daily as needed (SOB, cough and IPF). (Patient not taking: Reported on 09/18/2014) 300 mL 0 Not Taking  . multivitamin (THERAGRAN) per tablet Take 1 tablet by mouth daily.    Taking  . omeprazole (PRILOSEC) 20 MG capsule Take 1 capsule (20 mg total) by mouth daily. (Patient taking differently: Take 40 mg by mouth daily. ) 30 capsule 11 Taking  . VENTOLIN HFA 108 (90 BASE) MCG/ACT inhaler INHALE 2 PUFFS INTO THE LUNGS EVERY 6 HOURS AS NEEDED FOR WHEEZING. 18 each 2 Taking  . Vortioxetine HBr 10 MG TABS Take 1 tablet by mouth daily.   Taking  . warfarin (COUMADIN) 5 MG tablet Take 1.5 tablets (7.5 mg total) by mouth daily. Take 7.5 MG DAILY TILL pt/inr NEXT CHECK (Patient not taking: Reported on 09/18/2014) 30 tablet 0 Not Taking    Physical Exam: 130/70 88 afibrile RR 30     Affect appropriate Chronically ill white male  HEENT: normal Neck supple with no adenopathy JVP elevated using accesory muscles  Lungs diffuse velcro crackles with exp wheezing  Heart:  V5/I4 click SEM  murmur, no rub, gallop or click PMI normal Abdomen: benighn, BS positve, no tenderness, no AAA no bruit.  No HSM or HJR Distal pulses intact with no bruits Plus one bilateral edema Neuro non-focal Skin warm and dry Weak  Somewhat somulent   No current facility-administered medications on file prior to encounter.   Current Outpatient Prescriptions on File Prior to Encounter  Medication Sig Dispense Refill  . aspirin 81 MG  tablet Take 81 mg by mouth daily.     Marland Kitchen atorvastatin (LIPITOR) 80 MG tablet Take 1 tablet (80 mg total) by mouth daily. 90 tablet 3  . celecoxib (CELEBREX) 200 MG capsule Take 1 capsule (200 mg total) by mouth daily. 90 capsule 3  . chlorpheniramine-HYDROcodone (TUSSIONEX PENNKINETIC ER) 10-8 MG/5ML LQCR Take 5 mLs by mouth 2 (two) times daily. (Patient not taking: Reported on 09/18/2014) 115 mL 0  . docusate sodium (COLACE) 100 MG capsule Take 200 mg by mouth daily.    Marland Kitchen  fluticasone (FLONASE) 50 MCG/ACT nasal spray Place 2 sprays into both nostrils daily. 16 g 2  . losartan-hydrochlorothiazide (HYZAAR) 50-12.5 MG per tablet Take 1 tablet by mouth daily. 90 tablet 3  . morphine 10 MG/5ML solution Take 2.5 mLs (5 mg total) by mouth 4 (four) times daily as needed (SOB, cough and IPF). (Patient not taking: Reported on 09/18/2014) 300 mL 0  . multivitamin (THERAGRAN) per tablet Take 1 tablet by mouth daily.     Marland Kitchen omeprazole (PRILOSEC) 20 MG capsule Take 1 capsule (20 mg total) by mouth daily. (Patient taking differently: Take 40 mg by mouth daily. ) 30 capsule 11  . VENTOLIN HFA 108 (90 BASE) MCG/ACT inhaler INHALE 2 PUFFS INTO THE LUNGS EVERY 6 HOURS AS NEEDED FOR WHEEZING. 18 each 2  . Vortioxetine HBr 10 MG TABS Take 1 tablet by mouth daily.    Marland Kitchen warfarin (COUMADIN) 5 MG tablet Take 1.5 tablets (7.5 mg total) by mouth daily. Take 7.5 MG DAILY TILL pt/inr NEXT CHECK (Patient not taking: Reported on 09/18/2014) 30 tablet 0    Labs:   Lab Results  Component Value Date   WBC 11.6* 01/04/2014   HGB 14.7 01/04/2014   HCT 43.8 01/04/2014   MCV 90.9 01/04/2014   PLT 293.0 01/04/2014   No results for input(s): NA, K, CL, CO2, BUN, CREATININE, CALCIUM, PROT, BILITOT, ALKPHOS, ALT, AST, GLUCOSE in the last 168 hours.  Invalid input(s): LABALBU Lab Results  Component Value Date   CKTOTAL 44 03/03/2013     Lab Results  Component Value Date   CHOL 137 01/04/2014   Lab Results  Component Value Date    HDL 56.90 01/04/2014   Lab Results  Component Value Date   LDLCALC 73 01/04/2014   Lab Results  Component Value Date   TRIG 36.0 01/04/2014   Lab Results  Component Value Date   CHOLHDL 2 01/04/2014   No results found for: LDLDIRECT     Radiology: No results found.  EKG:  SR LBBB  ASSESSMENT AND PLAN:   Dyspnea:  Respitory failure on high dose oxygen.  Will have pulmonary see for ILD in hospital Check CXR and ABG for CO2 retention Given change in MS  May be a component of CHF but not primary issue.  Check BNP In regard to potential for lung transplant not sure He will make it and would likely need heart lung given EF 35%    Will evaluate in am but not sure he is stable enough for right and left cath at this time  Anticoagulation:  Coumadin held start heparin per pharmacy protocol  Neck Pain:  Start flexeril and tramadol for pain.  Follow mental status  Overall prognosis is poor discussed code status with patient and wife and they want everything done at this point even if it results In prolonged intubation/trach.  Full Code   Signed: Collier Salina Nishan5/01/2015, 12:08 PM

## 2014-09-18 NOTE — Progress Notes (Signed)
09/18/14  Pharmacy-  Heparin 2115   Heparin Level < 0.1   A/P:  69yo male on heparin bridge while Coumadin on hold for cath on 5/10.  Heparin level is undetectable on current rate of 1200 units/hr.  Per d/w RN, there have been no issues with the IV pump nor bleeding.  Will give a small bolus and increase the rate.  1-  Heparin 2000 units IV x 1, inc to 1400 units/hr 2-  Heparin level 8hr 3-  Monitor for bleeding  Gracy Bruins, PharmD [facility]

## 2014-09-18 NOTE — Progress Notes (Signed)
ANTICOAGULATION CONSULT NOTE - Initial Consult  Pharmacy Consult for Heparin Indication:   Allergies  Allergen Reactions  . Penicillins Swelling and Rash    Patient Measurements:  Patient weight: 84.4 kg IBW: 66.1 Heparin Dosing Weight: 84 kg  Vital Signs: BP: 136/82 mmHg (05/09 0944) Pulse Rate: 97 (05/09 0944)  Labs:  Recent Labs  09/18/14 1105  INR 1.1    CrCl cannot be calculated (Patient has no serum creatinine result on file.).   Medical History: Past Medical History  Diagnosis Date  . Pure hyperglyceridemia   . S/P aortic valve replacement   . Other left bundle branch block   . Other dyspnea and respiratory abnormality   . Obesity, unspecified   . Unspecified arthropathy, lower leg   . Internal hemorrhoids without mention of complication 02/63/7858    Colonoscopy   . Heart murmur   . Pulmonary fibrosis   . GERD (gastroesophageal reflux disease)   . Hemorrhoids   . Arthritis   . Cellulitis of hand, left     possible IV infiltration as source    Medications:  Prescriptions prior to admission  Medication Sig Dispense Refill Last Dose  . aspirin 81 MG tablet Take 81 mg by mouth daily.    Taking  . atorvastatin (LIPITOR) 80 MG tablet Take 1 tablet (80 mg total) by mouth daily. 90 tablet 3 Taking  . celecoxib (CELEBREX) 200 MG capsule Take 1 capsule (200 mg total) by mouth daily. 90 capsule 3 Taking  . chlorpheniramine-HYDROcodone (TUSSIONEX PENNKINETIC ER) 10-8 MG/5ML LQCR Take 5 mLs by mouth 2 (two) times daily. (Patient not taking: Reported on 09/18/2014) 115 mL 0 Not Taking  . chlorpheniramine-HYDROcodone (TUSSIONEX PENNKINETIC ER) 10-8 MG/5ML SUER Take 5 mLs by mouth 2 (two) times daily as needed for cough. 115 mL 0   . docusate sodium (COLACE) 100 MG capsule Take 200 mg by mouth daily.   Taking  . ESBRIET 267 MG CAPS   5   . fluticasone (FLONASE) 50 MCG/ACT nasal spray Place 2 sprays into both nostrils daily. 16 g 2 Taking  . HYDROcodone-acetaminophen  (NORCO) 10-325 MG per tablet Take 2 tablets by mouth daily as needed. 60 tablet 0   . losartan-hydrochlorothiazide (HYZAAR) 50-12.5 MG per tablet Take 1 tablet by mouth daily. 90 tablet 3 Taking  . morphine 10 MG/5ML solution Take 2.5 mLs (5 mg total) by mouth 4 (four) times daily as needed (SOB, cough and IPF). (Patient not taking: Reported on 09/18/2014) 300 mL 0 Not Taking  . multivitamin (THERAGRAN) per tablet Take 1 tablet by mouth daily.    Taking  . naproxen (NAPROSYN) 500 MG tablet Take 500 mg by mouth 2 (two) times daily.  5   . OFEV 150 MG CAPS   11   . omeprazole (PRILOSEC) 20 MG capsule Take 1 capsule (20 mg total) by mouth daily. (Patient taking differently: Take 40 mg by mouth daily. ) 30 capsule 11 Taking  . VENTOLIN HFA 108 (90 BASE) MCG/ACT inhaler INHALE 2 PUFFS INTO THE LUNGS EVERY 6 HOURS AS NEEDED FOR WHEEZING. 18 each 2 Taking  . Vortioxetine HBr 10 MG TABS Take 1 tablet by mouth daily.   Taking  . warfarin (COUMADIN) 5 MG tablet Take 1.5 tablets (7.5 mg total) by mouth daily. Take 7.5 MG DAILY TILL pt/inr NEXT CHECK (Patient not taking: Reported on 09/18/2014) 30 tablet 0 Not Taking    Assessment: 77 yoM with increasing cough and dyspnea. He recently went to [facility]  2 weeks ago to be evaluated for Lung Transplant. Were concerned for his heart and wanted Right and Left Cath done. Warfarin has been held since 5/6.  Pharmacy consulted to dose heparin in this patient. Planned Cath for 5/10 AM.  CBC in process. No signs of bleeding noted.  Goal of Therapy:  Heparin level 0.3-0.7 units/ml Monitor platelets by anticoagulation protocol: Yes   Plan:  -Start Heparin 1200 units/hr -F/U BL CBC -Check 8-hour HL @ 2100 -Daily HL and CBC -F/U Cath and plans to restart warfarin  Theron Arista, PharmD Clinical Pharmacist - Resident Pager: [phone] 5/9/201612:45 PM

## 2014-09-19 ENCOUNTER — Inpatient Hospital Stay (HOSPITAL_COMMUNITY): Payer: Medicare Other

## 2014-09-19 ENCOUNTER — Encounter (HOSPITAL_COMMUNITY)
Admission: AD | Disposition: A | Discharge status: Home / Self Care | Payer: Medicare Other | Source: Ambulatory Visit | Attending: Cardiovascular Disease

## 2014-09-19 ENCOUNTER — Encounter (HOSPITAL_COMMUNITY): Payer: Self-pay | Admitting: Cardiovascular Disease

## 2014-09-19 DIAGNOSIS — R05 Cough: Secondary | ICD-10-CM

## 2014-09-19 DIAGNOSIS — I359 Nonrheumatic aortic valve disorder, unspecified: Secondary | ICD-10-CM

## 2014-09-19 DIAGNOSIS — I2510 Atherosclerotic heart disease of native coronary artery without angina pectoris: Secondary | ICD-10-CM

## 2014-09-19 DIAGNOSIS — J9621 Acute and chronic respiratory failure with hypoxia: Principal | ICD-10-CM

## 2014-09-19 HISTORY — PX: CARDIAC CATHETERIZATION: SHX172

## 2014-09-19 LAB — GLUCOSE, CAPILLARY
GLUCOSE-CAPILLARY: 156 mg/dL — AB (ref 70–99)
GLUCOSE-CAPILLARY: 158 mg/dL — AB (ref 70–99)
Glucose-Capillary: 119 mg/dL — ABNORMAL HIGH (ref 70–99)
Glucose-Capillary: 130 mg/dL — ABNORMAL HIGH (ref 70–99)

## 2014-09-19 LAB — CBC
HEMATOCRIT: 36.2 % — AB (ref 39.0–52.0)
Hemoglobin: 11.7 g/dL — ABNORMAL LOW (ref 13.0–17.0)
MCH: 27.6 pg (ref 26.0–34.0)
MCHC: 32.3 g/dL (ref 30.0–36.0)
MCV: 85.4 fL (ref 78.0–100.0)
Platelets: 243 10*3/uL (ref 150–400)
RBC: 4.24 MIL/uL (ref 4.22–5.81)
RDW: 13.4 % (ref 11.5–15.5)
WBC: 9.7 10*3/uL (ref 4.0–10.5)

## 2014-09-19 LAB — BASIC METABOLIC PANEL
Anion gap: 10 (ref 5–15)
BUN: 10 mg/dL (ref 6–20)
CHLORIDE: 99 mmol/L — AB (ref 101–111)
CO2: 29 mmol/L (ref 22–32)
Calcium: 8.9 mg/dL (ref 8.9–10.3)
Creatinine, Ser: 0.56 mg/dL — ABNORMAL LOW (ref 0.61–1.24)
Glucose, Bld: 178 mg/dL — ABNORMAL HIGH (ref 70–99)
Potassium: 3.9 mmol/L (ref 3.5–5.1)
SODIUM: 138 mmol/L (ref 135–145)

## 2014-09-19 LAB — HEPARIN LEVEL (UNFRACTIONATED): HEPARIN UNFRACTIONATED: 0.15 [IU]/mL — AB (ref 0.30–0.70)

## 2014-09-19 LAB — POCT I-STAT 3, VENOUS BLOOD GAS (G3P V)
Acid-Base Excess: 1 mmol/L (ref 0.0–2.0)
Bicarbonate: 26.8 mEq/L — ABNORMAL HIGH (ref 20.0–24.0)
O2 Saturation: 75 %
PCO2 VEN: 45.8 mmHg (ref 45.0–50.0)
TCO2: 28 mmol/L (ref 0–100)
pH, Ven: 7.376 — ABNORMAL HIGH (ref 7.250–7.300)
pO2, Ven: 41 mmHg (ref 30.0–45.0)

## 2014-09-19 SURGERY — RIGHT/LEFT HEART CATH AND CORONARY ANGIOGRAPHY
Anesthesia: LOCAL

## 2014-09-19 MED ORDER — HEPARIN (PORCINE) IN NACL 2-0.9 UNIT/ML-% IJ SOLN
INTRAMUSCULAR | Status: AC
  Filled 2014-09-19: qty 1500

## 2014-09-19 MED ORDER — MIDAZOLAM HCL 2 MG/2ML IJ SOLN
INTRAMUSCULAR | Status: AC
  Filled 2014-09-19: qty 2

## 2014-09-19 MED ORDER — WARFARIN SODIUM 5 MG PO TABS
5.0000 mg | ORAL_TABLET | Freq: Once | ORAL | Status: AC
  Administered 2014-09-19: 5 mg via ORAL
  Filled 2014-09-19: qty 1

## 2014-09-19 MED ORDER — ACETAMINOPHEN 325 MG PO TABS: 650.0000 mg | ORAL_TABLET | ORAL | Status: DC | PRN

## 2014-09-19 MED ORDER — LIDOCAINE HCL (PF) 1 % IJ SOLN
INTRAMUSCULAR | Status: AC
  Filled 2014-09-19: qty 30

## 2014-09-19 MED ORDER — SODIUM CHLORIDE 0.9 % IJ SOLN: 3.0000 mL | INTRAMUSCULAR | Status: DC | PRN

## 2014-09-19 MED ORDER — SODIUM CHLORIDE 0.9 % IJ SOLN
3.0000 mL | Freq: Two times a day (BID) | INTRAMUSCULAR | Status: DC
  Administered 2014-09-19 (×2): 3 mL via INTRAVENOUS

## 2014-09-19 MED ORDER — OXYCODONE-ACETAMINOPHEN 5-325 MG PO TABS
1.0000 | ORAL_TABLET | ORAL | Status: DC | PRN
  Administered 2014-09-20 (×2): 2 via ORAL
  Filled 2014-09-19 (×2): qty 2

## 2014-09-19 MED ORDER — DIAZEPAM 2 MG PO TABS
2.0000 mg | ORAL_TABLET | Freq: Three times a day (TID) | ORAL | Status: DC | PRN
  Administered 2014-09-20: 2 mg via ORAL
  Filled 2014-09-19: qty 1

## 2014-09-19 MED ORDER — ONDANSETRON HCL 4 MG/2ML IJ SOLN: 4.0000 mg | Freq: Four times a day (QID) | INTRAMUSCULAR | Status: DC | PRN

## 2014-09-19 MED ORDER — SODIUM CHLORIDE 0.9 % IV SOLN: 250.0000 mL | INTRAVENOUS | Status: DC | PRN

## 2014-09-19 MED ORDER — VERAPAMIL HCL 2.5 MG/ML IV SOLN
INTRAVENOUS | Status: AC
  Filled 2014-09-19: qty 2

## 2014-09-19 MED ORDER — HEPARIN (PORCINE) IN NACL 100-0.45 UNIT/ML-% IJ SOLN
1600.0000 [IU]/h | INTRAMUSCULAR | Status: DC
  Administered 2014-09-19 (×2): 1600 [IU]/h via INTRAVENOUS
  Filled 2014-09-19 (×2): qty 250

## 2014-09-19 MED ORDER — SODIUM CHLORIDE 0.9 % IJ SOLN
3.0000 mL | Freq: Two times a day (BID) | INTRAMUSCULAR | Status: DC
  Administered 2014-09-19: 3 mL via INTRAVENOUS

## 2014-09-19 MED ORDER — HEPARIN BOLUS VIA INFUSION
1000.0000 [IU] | INTRAVENOUS | Status: AC
  Administered 2014-09-19: 1000 [IU] via INTRAVENOUS
  Filled 2014-09-19: qty 1000

## 2014-09-19 MED ORDER — MIDAZOLAM HCL 2 MG/2ML IJ SOLN
INTRAMUSCULAR | Status: DC | PRN
  Administered 2014-09-19 (×3): 1 mg via INTRAVENOUS

## 2014-09-19 MED ORDER — IOHEXOL 350 MG/ML SOLN
INTRAVENOUS | Status: DC | PRN
  Administered 2014-09-19: 125 mL via INTRACARDIAC

## 2014-09-19 MED ORDER — WARFARIN - PHARMACIST DOSING INPATIENT: Freq: Every day | Status: DC

## 2014-09-19 SURGICAL SUPPLY — 21 items
CATH BALLN WEDGE 5F 110CM (CATHETERS) IMPLANT
CATH INFINITI 5 FR JL3.5 (CATHETERS) IMPLANT
CATH INFINITI 5FR ANG PIGTAIL (CATHETERS) ×2 IMPLANT
CATH INFINITI 5FR JL4 (CATHETERS) ×2 IMPLANT
CATH INFINITI 5FR JL5 (CATHETERS) ×2 IMPLANT
CATH INFINITI 5FR MULTPACK ANG (CATHETERS) IMPLANT
CATH INFINITI JR4 5F (CATHETERS) ×2 IMPLANT
CATH SWAN GANZ 7F STRAIGHT (CATHETERS) ×2 IMPLANT
DEVICE RAD COMP TR BAND LRG (VASCULAR PRODUCTS) IMPLANT
GLIDESHEATH SLEND SS 6F .021 (SHEATH) IMPLANT
KIT HEART LEFT (KITS) ×2 IMPLANT
KIT HEART RIGHT NAMIC (KITS) ×2 IMPLANT
PACK CARDIAC CATHETERIZATION (CUSTOM PROCEDURE TRAY) ×2 IMPLANT
SHEATH FAST CATH BRACH 5F 5CM (SHEATH) IMPLANT
SHEATH PINNACLE 5F 10CM (SHEATH) ×2 IMPLANT
SHEATH PINNACLE 7F 10CM (SHEATH) ×2 IMPLANT
SYR MEDRAD MARK V 150ML (SYRINGE) ×2 IMPLANT
TRANSDUCER W/STOPCOCK (MISCELLANEOUS) ×4 IMPLANT
TUBING CIL FLEX 10 FLL-RA (TUBING) ×2 IMPLANT
WIRE EMERALD 3MM-J .035X150CM (WIRE) ×2 IMPLANT
WIRE SAFE-T 1.5MM-J .035X260CM (WIRE) IMPLANT

## 2014-09-19 NOTE — Progress Notes (Signed)
Name: Dale [name] MRN: [id number] DOB: 01/18/1946    ADMISSION DATE:  09/18/2014 CONSULTATION DATE:  09/19/2014  REFERRING MD :  Johnsie Cancel  CHIEF COMPLAINT:  Dyspnea  BRIEF PATIENT DESCRIPTION: 69 y.o. M with hx of severe IPF (currently underoing transplant eval at [facility] in [facility]), brought to [facility] 09/18/14 in preparation for left and right heart cath 09/19/14. PCCM consulted for assistance with management of his chronic dyspnea.  SIGNIFICANT EVENTS  5/9 - admitted 5/10 - planned left and right heart cath  STUDIES:  None   HISTORY OF PRESENT ILLNESS:  Dale [name] is a 69 y.o. M with PMH as outlined below including IPF with biopsy proven UIP in 03/2013. He is followed by Dr. Chase Caller as an outpatient.  He was initially evaluated for transplant at [facility] but was denied due to his BMI.  Family then seeked transplant at [facility] in [place], [facility] in [place], and [facility] in [place].  Pt is currently being evaluated  and considered at [facility] for both heart and lung transplant; however, lung is a priority due to his severe ILD.  Transplant team at [facility] requesting right and left heart cath.  He was seen by Dr. Johnsie Cancel on 09/18/14 and admitted to [facility] in hopes of performing cardiac cath 09/19/14.  PCCM was consulted for assistance with his IPF management pre and post procedure.  He is chronically on anywhere from 2 - 4 L O2 at rest and 4 - 10L with exertion.  He was seen by Dr. Chase Caller on 09/18/14 and had during office visit, reported progressive cough associated with his usual dyspnea.  He was unable to perform spirometry due to his severe cough.  He was started on hydrocodone for cough.  Family declined any morphine for cough and dyspnea after advice from [facility].   In addition, he was scheduled to begin Ofev in hopes of ceasing disease progression; however, start date has been postponed until cardiac cath is completed due to theoretical risk of increased bleeding  with Ofev.  SUBJECTIVE:  Currently in cath lab recovery.  Mildly sedate, no new complaints.  VITAL SIGNS: Temp:  [97.6 F (36.4 C)-98.6 F (37 C)] 97.6 F (36.4 C) (05/10 0740) Pulse Rate:  [0-127] 0 (05/10 1114) Resp:  [0-29] 0 (05/10 1114) BP: (111-171)/(69-108) 129/81 mmHg (05/10 1059) SpO2:  [0 %-100 %] 0 % (05/10 1114) Weight:  [84.4 kg (186 lb 1.1 oz)] 84.4 kg (186 lb 1.1 oz) (05/09 1200)  PHYSICAL EXAMINATION: General: Chronically ill appearing male, resting in bed, in NAD. Neuro: A&O x 3, non-focal.  Head:[place]/AT EENT:  PERRL, sclerae anicteric, EOM-I and MMM Cardiovascular: RRR, systolic click noted. Lungs: Respirations even and unlabored.  Faint rhonchi. Abdomen: BS x 4, soft, NT/ND.  Musculoskeletal: No gross deformities, no edema.  Skin: Intact, warm, no rashes.  Recent Labs Lab 09/18/14 1300 09/19/14 0332  NA 138 138  K 3.6 3.9  CL 101 99*  CO2 30 29  BUN 8 10  CREATININE 0.62 0.56*  GLUCOSE 88 178*   Recent Labs Lab 09/18/14 1300 09/19/14 0332  HGB 11.5* 11.7*  HCT 36.0* 36.2*  WBC 11.1* 9.7  PLT 264 243   Dg Chest Port 1 View  09/19/2014   CLINICAL DATA:  Shortness of breath.  EXAM: PORTABLE CHEST - 1 VIEW  COMPARISON:  09/18/2014 .  09/08/2013 .  FINDINGS: Prior CABG. Severe cardiomegaly. Diffuse pulmonary infiltrates again noted. No interim change. A component of chronic  underlying interstitial disease again may be present. No pleural effusion or pneumothorax.  IMPRESSION: 1. Prior CABG. Persistent severe cardiomegaly with bilateral diffuse pulmonary infiltrates again noted. No interim change. These changes could be secondary to pulmonary edema and/or bilateral pneumonia. 2. Underlying chronic interstitial lung disease.   Electronically Signed   By: Marcello Moores  Register   On: 09/19/2014 07:22   Dg Chest Port 1 View  09/18/2014   CLINICAL DATA:  Preoperative exam prior to cardiac catheterization. Idiopathic pulmonary fibrosis.  EXAM: PORTABLE CHEST - 1 VIEW   COMPARISON:  09/08/2013  FINDINGS: Interval increase in peripheral pulmonary parenchymal opacity superimposed on chronically prominent interstitial markings is noted. Trace pleural fluid may be present but is poorly visualized at portable AP technique. Evidence of median sternotomy noted. Moderate enlargement of the cardiomediastinal silhouette is identified. No acute osseous finding.  IMPRESSION: Interval increase in predominantly peripheral hazy pulmonary airspace opacity superimposed on chronic interstitial prominence. This could represent progression of the given history of idiopathic pulmonary fibrosis with areas of potentially active disease, although the findings are nonspecific and edema, infection, or other alveolar filling processes could appear similar.   Electronically Signed   By: Conchita Paris M.D.   On: 09/18/2014 19:22   I reviewed CXR myself, diffuse ILD noted on CXR and chest CT.  ASSESSMENT / PLAN:  Chronic hypoxic respiratory failure due to underlying severe IPF Potential lung transplant candidate - currently being evaluated / considered at [facility] Plan: Continue supplemental O2 as needed to maintain SpO2 > 92%. Continue hydrocodone for cough and dyspnea. Albuterol PRN. Ofev on hold until 48 hrs post cath. F/U on cath results.  Discussed with cardiology and bedside RN.  Rush Farmer, M.D. [facility] Pulmonary/Critical Care Medicine. Pager: [phone]. After hours pager: [phone].  09/19/2014 11:17 AM

## 2014-09-19 NOTE — Progress Notes (Signed)
Site area:right groin a 5 french arterial and a 7 french venous sheath was removed  Site Prior to Removal:  Level 0  Pressure Applied For 20 MINUTES    Minutes Beginning at 1135am  Manual:   Yes.    Patient Status During Pull:  stable  Post Pull Groin Site:  Level 0  Post Pull Instructions Given:  Yes.    Post Pull Pulses Present:  Yes.    Dressing Applied:  Yes.    Comments:  VS remain stable during sheath pull.  Pt denies any discomfort at this time at site.

## 2014-09-19 NOTE — Interval H&P Note (Signed)
History and Physical Interval Note:  09/19/2014 8:04 AM  Dale [name]  has presented today for surgery, with the diagnosis of cp  The various methods of treatment have been discussed with the patient and family. After consideration of risks, benefits and other options for treatment, the patient has consented to  Procedure(s): Right/Left Heart Cath and Coronary Angiography (N/A) as a surgical intervention .  The patient's history has been reviewed, patient examined, no change in status, stable for surgery.  I have reviewed the patient's chart and labs.  Questions were answered to the patient's satisfaction.     Jenkins Rouge

## 2014-09-19 NOTE — Progress Notes (Signed)
Utilization review completed. Waldemar Siegel, RN, BSN. 

## 2014-09-19 NOTE — Progress Notes (Signed)
ANTICOAGULATION CONSULT NOTE - Initial Consult  Pharmacy Consult for Warfarin Indication: St. Jude Mechanical Aortic Valve  Allergies  Allergen Reactions  . Penicillins Swelling and Rash    Patient Measurements: Height: 5\' 7"  (170.2 cm) Weight: 186 lb 1.1 oz (84.4 kg) IBW/kg (Calculated) : 66.1  Vital Signs: Temp: 97.8 F (36.6 C) (05/10 1200) Temp Source: Oral (05/10 1200) BP: 140/64 mmHg (05/10 1200) Pulse Rate: 94 (05/10 1200)  Labs:  Recent Labs  09/18/14 1105 09/18/14 1300 09/18/14 1958 09/19/14 0332  HGB  --  11.5*  --  11.7*  HCT  --  36.0*  --  36.2*  PLT  --  264  --  243  APTT  --  30  --   --   INR 1.1  --   --   --   HEPARINUNFRC  --   --  <0.10* 0.15*  CREATININE  --  0.62  --  0.56*    Estimated Creatinine Clearance: 94.3 mL/min (by C-G formula based on Cr of 0.56).   Medical History: Past Medical History  Diagnosis Date  . Pure hyperglyceridemia   . S/P aortic valve replacement   . Other left bundle branch block   . Other dyspnea and respiratory abnormality   . Obesity, unspecified   . Unspecified arthropathy, lower leg   . Internal hemorrhoids without mention of complication 85/63/1497    Colonoscopy   . Heart murmur   . Pulmonary fibrosis   . GERD (gastroesophageal reflux disease)   . Hemorrhoids   . Arthritis   . Cellulitis of hand, left     possible IV infiltration as source    Medications:  Scheduled:  . albuterol  2.5 mg Nebulization Q4H  . aspirin EC  81 mg Oral Daily  . atorvastatin  80 mg Oral Daily  . celecoxib  200 mg Oral Daily  . docusate sodium  200 mg Oral Daily  . fluticasone  2 spray Each Nare Daily  . losartan  50 mg Oral Daily   And  . hydrochlorothiazide  12.5 mg Oral Daily  . insulin aspart  0-9 Units Subcutaneous TID WC  . methylPREDNISolone (SOLU-MEDROL) injection  40 mg Intravenous Q12H  . pantoprazole  40 mg Oral Daily  . sodium chloride  3 mL Intravenous Q12H  . sodium chloride  3 mL Intravenous Q12H   . sodium chloride  3 mL Intravenous Q12H  . sodium chloride  3 mL Intravenous Q12H  . Vortioxetine HBr  1 tablet Oral Daily    Assessment: 48 yoM with increasing cough and dyspnea on warfarin PTA for St Jude Mechanical Aortic Valve. He recently went to [facility] 2 weeks ago to be evaluated for Lung Transplant and were concerned for his heart and wanted Right and Left Cath done. Warfarin has been held since 5/6.  INR on admission (5/9) was 1.1.  Patient bridged with heparin for 1 day and underwent cath on 5/10.  Sheath pulled at ~1200.   Per Dr. Johnsie Cancel, wants restart patient on warfarin but hold heparin until he evaluates the patient for risk of bleeding S/P cath this evening. He says he will place order for heparin if he wants to bridge therapy.  Hgb stable at 11.7, plts 243.  Home Dose: Warfarin 10 mg on MWF, 5 mg on TThSatSun. Per patient's wife, he was well managed on this regimen (He follows at an outside clinic so we do not have documentation of appointments).  Goal of Therapy:  INR  2-3 Monitor platelets by anticoagulation protocol: Yes   Plan:  -Give warfarin 5 mg X 1 tonight -Daily INR -F/U plans to bridge heparin with Nishan based on bleeding risk  Theron Arista, PharmD Clinical Pharmacist - Resident Pager: [phone] 5/10/20162:09 PM

## 2014-09-19 NOTE — Progress Notes (Signed)
ANTICOAGULATION CONSULT NOTE - Follow up  Pharmacy Consult for Heparin Indication: h/o Mechanical aortic valve   Allergies  Allergen Reactions  . Penicillins Swelling and Rash    Patient Measurements: Height: 5\' 7"  (170.2 cm) Weight: 186 lb 1.1 oz (84.4 kg) IBW/kg (Calculated) : 66.1 Heparin Dosing Weight: 84 kg  Vital Signs: Temp: 97.7 F (36.5 C) (05/10 0000) Temp Source: Oral (05/10 0000) BP: 153/108 mmHg (05/10 0100)  Labs:  Recent Labs  09/18/14 1105 09/18/14 1300 09/18/14 1958 09/19/14 0332  HGB  --  11.5*  --  11.7*  HCT  --  36.0*  --  36.2*  PLT  --  264  --  243  APTT  --  30  --   --   INR 1.1  --   --   --   HEPARINUNFRC  --   --  <0.10* 0.15*  CREATININE  --  0.62  --   --     Estimated Creatinine Clearance: 94.3 mL/min (by C-G formula based on Cr of 0.62).   Medical History: Past Medical History  Diagnosis Date  . Pure hyperglyceridemia   . S/P aortic valve replacement   . Other left bundle branch block   . Other dyspnea and respiratory abnormality   . Obesity, unspecified   . Unspecified arthropathy, lower leg   . Internal hemorrhoids without mention of complication 85/06/7739    Colonoscopy   . Heart murmur   . Pulmonary fibrosis   . GERD (gastroesophageal reflux disease)   . Hemorrhoids   . Arthritis   . Cellulitis of hand, left     possible IV infiltration as source    Medications:  Prescriptions prior to admission  Medication Sig Dispense Refill Last Dose  . atorvastatin (LIPITOR) 80 MG tablet Take 1 tablet (80 mg total) by mouth daily. 90 tablet 3 09/18/2014 at Unknown time  . celecoxib (CELEBREX) 200 MG capsule Take 1 capsule (200 mg total) by mouth daily. 90 capsule 3 09/18/2014 at Unknown time  . chlorpheniramine-HYDROcodone (TUSSIONEX PENNKINETIC ER) 10-8 MG/5ML SUER Take 5 mLs by mouth 2 (two) times daily as needed for cough. 115 mL 0 09/18/2014 at am  . docusate sodium (COLACE) 100 MG capsule Take 200 mg by mouth at bedtime.     09/17/2014 at Unknown time  . fluticasone (FLONASE) 50 MCG/ACT nasal spray Place 2 sprays into both nostrils daily. 16 g 2 09/18/2014 at Unknown time  . HYDROcodone-acetaminophen (NORCO) 10-325 MG per tablet Take 2 tablets by mouth daily as needed. (Patient taking differently: Take 1 tablet by mouth every 4 (four) hours as needed (pain). ) 60 tablet 0 09/18/2014 at Unknown time  . losartan-hydrochlorothiazide (HYZAAR) 50-12.5 MG per tablet Take 1 tablet by mouth daily. 90 tablet 3 09/18/2014 at Unknown time  . Multiple Vitamin (MULTIVITAMIN WITH MINERALS) TABS tablet Take 1 tablet by mouth daily.   09/18/2014 at Unknown time  . omeprazole (PRILOSEC) 20 MG capsule Take 1 capsule (20 mg total) by mouth daily. (Patient taking differently: Take 40 mg by mouth daily. ) 30 capsule 11 09/18/2014 at Unknown time  . VENTOLIN HFA 108 (90 BASE) MCG/ACT inhaler INHALE 2 PUFFS INTO THE LUNGS EVERY 6 HOURS AS NEEDED FOR WHEEZING. 18 each 2 09/17/2014 at Unknown time  . Vortioxetine HBr 10 MG TABS Take 10 mg by mouth daily. BRINTELLIX   09/18/2014 at Unknown time  . aspirin EC 81 MG tablet Take 81 mg by mouth daily.   09/14/2014  .  chlorpheniramine-HYDROcodone (TUSSIONEX PENNKINETIC ER) 10-8 MG/5ML LQCR Take 5 mLs by mouth 2 (two) times daily. (Patient not taking: Reported on 09/18/2014) 115 mL 0 Not Taking  . morphine 10 MG/5ML solution Take 2.5 mLs (5 mg total) by mouth 4 (four) times daily as needed (SOB, cough and IPF). (Patient not taking: Reported on 09/18/2014) 300 mL 0 Not Taking at Unknown time  . Nintedanib (OFEV) 150 MG CAPS Take 150 mg by mouth 2 (two) times daily.   not yet started  . warfarin (COUMADIN) 10 MG tablet Take 5-10 mg by mouth daily. Take 2 tablets (10 mg) on Monday, Wednesday, Friday, take 1 tablet (5 mg) on Sunday, Tuesday, Thursday, Saturday   09/14/2014  . warfarin (COUMADIN) 5 MG tablet Take 1.5 tablets (7.5 mg total) by mouth daily. Take 7.5 MG DAILY TILL pt/inr NEXT CHECK (Patient not taking: Reported on  09/18/2014) 30 tablet 0 Not Taking at Unknown time    Assessment: 69yo male on heparin bridge while Coumadin on hold for cath on 5/10. Heparin level = 0.15 on IV heparin drip 1400 units/hr    Goal of Therapy:  Heparin level 0.3-0.7 units/ml Monitor platelets by anticoagulation protocol: Yes   Plan:  Give 1000 units IV bolus of heparin and  Increase Heparin 1600 units/hr check 6-hour HL  Daily HL and CBC F/U Cath and plans to restart warfarin   Nicole Cella, RPh Clinical Pharmacist Pager: [phone]  5/10/20164:51 AM

## 2014-09-20 ENCOUNTER — Encounter (HOSPITAL_COMMUNITY): Payer: Self-pay | Admitting: *Deleted

## 2014-09-20 DIAGNOSIS — Z954 Presence of other heart-valve replacement: Secondary | ICD-10-CM

## 2014-09-20 DIAGNOSIS — R05 Cough: Secondary | ICD-10-CM | POA: Insufficient documentation

## 2014-09-20 DIAGNOSIS — R0600 Dyspnea, unspecified: Secondary | ICD-10-CM

## 2014-09-20 DIAGNOSIS — R040 Epistaxis: Secondary | ICD-10-CM | POA: Insufficient documentation

## 2014-09-20 DIAGNOSIS — J849 Interstitial pulmonary disease, unspecified: Secondary | ICD-10-CM

## 2014-09-20 DIAGNOSIS — R059 Cough, unspecified: Secondary | ICD-10-CM | POA: Insufficient documentation

## 2014-09-20 LAB — POCT I-STAT 3, VENOUS BLOOD GAS (G3P V)
ACID-BASE EXCESS: 1 mmol/L (ref 0.0–2.0)
Bicarbonate: 26.2 mEq/L — ABNORMAL HIGH (ref 20.0–24.0)
O2 SAT: 72 %
TCO2: 28 mmol/L (ref 0–100)
pCO2, Ven: 44.9 mmHg — ABNORMAL LOW (ref 45.0–50.0)
pH, Ven: 7.373 — ABNORMAL HIGH (ref 7.250–7.300)
pO2, Ven: 39 mmHg (ref 30.0–45.0)

## 2014-09-20 LAB — CBC
HCT: 34.7 % — ABNORMAL LOW (ref 39.0–52.0)
HEMOGLOBIN: 11.2 g/dL — AB (ref 13.0–17.0)
MCH: 27.9 pg (ref 26.0–34.0)
MCHC: 32.3 g/dL (ref 30.0–36.0)
MCV: 86.3 fL (ref 78.0–100.0)
Platelets: 271 10*3/uL (ref 150–400)
RBC: 4.02 MIL/uL — ABNORMAL LOW (ref 4.22–5.81)
RDW: 13.6 % (ref 11.5–15.5)
WBC: 16.4 10*3/uL — AB (ref 4.0–10.5)

## 2014-09-20 LAB — POCT I-STAT 3, ART BLOOD GAS (G3+)
Acid-Base Excess: 1 mmol/L (ref 0.0–2.0)
Bicarbonate: 25.6 mEq/L — ABNORMAL HIGH (ref 20.0–24.0)
O2 Saturation: 94 %
PO2 ART: 72 mmHg — AB (ref 80.0–100.0)
TCO2: 27 mmol/L (ref 0–100)
pCO2 arterial: 42 mmHg (ref 35.0–45.0)
pH, Arterial: 7.393 (ref 7.350–7.450)

## 2014-09-20 LAB — BASIC METABOLIC PANEL
Anion gap: 10 (ref 5–15)
BUN: 12 mg/dL (ref 6–20)
CALCIUM: 9 mg/dL (ref 8.9–10.3)
CO2: 28 mmol/L (ref 22–32)
CREATININE: 0.63 mg/dL (ref 0.61–1.24)
Chloride: 100 mmol/L — ABNORMAL LOW (ref 101–111)
GFR calc Af Amer: >60 mL/min (ref >60)
GFR calc non Af Amer: >60 mL/min (ref >60)
GLUCOSE: 192 mg/dL — AB (ref 70–99)
Potassium: 3.9 mmol/L (ref 3.5–5.1)
Sodium: 138 mmol/L (ref 135–145)

## 2014-09-20 LAB — PROTIME-INR
INR: 1.05 (ref 0.00–1.49)
Prothrombin Time: 13.8 seconds (ref 11.6–15.2)

## 2014-09-20 LAB — GLUCOSE, CAPILLARY
Glucose-Capillary: 120 mg/dL — ABNORMAL HIGH (ref 70–99)
Glucose-Capillary: 97 mg/dL (ref 70–99)

## 2014-09-20 MED ORDER — GUAIFENESIN-DM 100-10 MG/5ML PO SYRP
5.0000 mL | ORAL_SOLUTION | ORAL | Status: DC | PRN
  Administered 2014-09-20: 5 mL via ORAL
  Filled 2014-09-20: qty 5

## 2014-09-20 MED ORDER — WARFARIN SODIUM 10 MG PO TABS
10.0000 mg | ORAL_TABLET | Freq: Once | ORAL | Status: DC
  Filled 2014-09-20: qty 1

## 2014-09-20 MED ORDER — GUAIFENESIN 100 MG/5ML PO SYRP
200.0000 mg | ORAL_SOLUTION | ORAL | Status: DC | PRN
  Filled 2014-09-20: qty 10

## 2014-09-20 MED ORDER — NINTEDANIB ESYLATE 150 MG PO CAPS: 150.0000 mg | ORAL_CAPSULE | Freq: Two times a day (BID) | ORAL | Status: DC

## 2014-09-20 MED FILL — Lidocaine HCl Local Preservative Free (PF) Inj 1%: INTRAMUSCULAR | Qty: 30 | Status: AC

## 2014-09-20 MED FILL — Heparin Sodium (Porcine) 2 Unit/ML in Sodium Chloride 0.9%: INTRAMUSCULAR | Qty: 1500 | Status: AC

## 2014-09-20 NOTE — Discharge Instructions (Addendum)
1. You have hospital follow up appointments as follows: 09/22/11 - please go to your doctor's office for INR check  09/25/14 with cardiology (Dr. Johnsie Cancel) 09/28/14 with pulmonology ([name] Parrett, PA)    2. Please take all medications as prescribed.  Do not take your Ofev today or tomorrow.  You can restart your Ofev on 09/22/14.   3. If you have worsening of your symptoms or new symptoms arise, please call the clinic ([phone]), or go to the ER immediately if symptoms are severe.

## 2014-09-20 NOTE — Progress Notes (Signed)
Patient can be d/c from cardiac perspective Records and cath with images sent to [facility] yesterday I think it is fine to re coumadinize with no lovenox bridge Got dose of coumadin yesterday Check INR in clinic Friday  Needs close f/u with pulmonary for ILD He should see them regarding starting new Rx for ILD Ofev with its attendant bleeding risks  F/U with me next available  [employer]

## 2014-09-20 NOTE — Progress Notes (Signed)
Name: Dale [name] MRN: [id number] DOB: [birth year]    ADMISSION DATE:  09/18/2014 CONSULTATION DATE:  09/20/2014  REFERRING MD :  Johnsie Cancel  CHIEF COMPLAINT:  Dyspnea  BRIEF PATIENT DESCRIPTION: 69 y.o. M with hx of severe IPF (currently underoing transplant eval at [facility] in [facility]), brought to [facility] 09/18/14 in preparation for left and right heart cath 09/19/14. PCCM consulted for assistance with management of his chronic dyspnea.  SIGNIFICANT EVENTS  5/9 - admitted 5/10 - planned left and right heart cath  STUDIES:  5/10 cath clean.  HISTORY OF PRESENT ILLNESS:  Dale [name] is a 69 y.o. M with PMH as outlined below including IPF with biopsy proven UIP in 03/2013. He is followed by Dr. Chase Caller as an outpatient.  He was initially evaluated for transplant at [facility] but was denied due to his BMI.  Family then seeked transplant at [facility] in [place], [place] in [place], and [facility] in [place].  Pt is currently being evaluated  and considered at [facility] for both heart and lung transplant; however, lung is a priority due to his severe ILD.  Transplant team at [facility] requesting right and left heart cath.  He was seen by Dr. Johnsie Cancel on 09/18/14 and admitted to [facility] in hopes of performing cardiac cath 09/19/14.  PCCM was consulted for assistance with his IPF management pre and post procedure.  He is chronically on anywhere from 2 - 4 L O2 at rest and 4 - 10L with exertion.  He was seen by Dr. Chase Caller on 09/18/14 and had during office visit, reported progressive cough associated with his usual dyspnea.  He was unable to perform spirometry due to his severe cough.  He was started on hydrocodone for cough.  Family declined any morphine for cough and dyspnea after advice from [facility].   In addition, he was scheduled to begin Ofev in hopes of ceasing disease progression; however, start date has been postponed until cardiac cath is completed due to theoretical risk of increased  bleeding with Ofev.  SUBJECTIVE:  Cough much improved with humidifier and no further nose bleeds.  VITAL SIGNS: Temp:  [97.5 F (36.4 C)-98.3 F (36.8 C)] 98.3 F (36.8 C) (05/11 0818) Pulse Rate:  [0-106] 94 (05/10 1200) Resp:  [0-42] 28 (05/11 0900) BP: (96-140)/(51-93) 121/64 mmHg (05/11 0900) SpO2:  [0 %-100 %] 95 % (05/11 0900) Weight:  [76.4 kg (168 lb 6.9 oz)-84.868 kg (187 lb 1.6 oz)] 84.868 kg (187 lb 1.6 oz) (05/11 0411)  PHYSICAL EXAMINATION: General: Chronically ill appearing male, resting in bed, in NAD. Neuro: A&O x 3, non-focal.  Head:[facility]/AT EENT:  PERRL, sclerae anicteric, EOM-I and MMM Cardiovascular: RRR, systolic click noted. Lungs: Respirations even and unlabored.  Faint rhonchi. Abdomen: BS x 4, soft, NT/ND.  Musculoskeletal: No gross deformities, no edema.  Skin: Intact, warm, no rashes.  Recent Labs Lab 09/18/14 1300 09/19/14 0332 09/20/14 0250  NA 138 138 138  K 3.6 3.9 3.9  CL 101 99* 100*  CO2 30 29 28   BUN 8 10 12   CREATININE 0.62 0.56* 0.63  GLUCOSE 88 178* 192*    Recent Labs Lab 09/18/14 1300 09/19/14 0332 09/20/14 0250  HGB 11.5* 11.7* 11.2*  HCT 36.0* 36.2* 34.7*  WBC 11.1* 9.7 16.4*  PLT 264 243 271   Dg Chest Port 1 View  09/19/2014   CLINICAL DATA:  Shortness of breath.  EXAM: PORTABLE CHEST - 1 VIEW  COMPARISON:  09/18/2014 .  09/08/2013 .  FINDINGS: Prior CABG. Severe cardiomegaly. Diffuse pulmonary infiltrates again noted. No interim change. A component of chronic underlying interstitial disease again may be present. No pleural effusion or pneumothorax.  IMPRESSION: 1. Prior CABG. Persistent severe cardiomegaly with bilateral diffuse pulmonary infiltrates again noted. No interim change. These changes could be secondary to pulmonary edema and/or bilateral pneumonia. 2. Underlying chronic interstitial lung disease.   Electronically Signed   By: Marcello Moores  Register   On: 09/19/2014 07:22   Dg Chest Port 1 View  09/18/2014   CLINICAL  DATA:  Preoperative exam prior to cardiac catheterization. Idiopathic pulmonary fibrosis.  EXAM: PORTABLE CHEST - 1 VIEW  COMPARISON:  09/08/2013  FINDINGS: Interval increase in peripheral pulmonary parenchymal opacity superimposed on chronically prominent interstitial markings is noted. Trace pleural fluid may be present but is poorly visualized at portable AP technique. Evidence of median sternotomy noted. Moderate enlargement of the cardiomediastinal silhouette is identified. No acute osseous finding.  IMPRESSION: Interval increase in predominantly peripheral hazy pulmonary airspace opacity superimposed on chronic interstitial prominence. This could represent progression of the given history of idiopathic pulmonary fibrosis with areas of potentially active disease, although the findings are nonspecific and edema, infection, or other alveolar filling processes could appear similar.   Electronically Signed   By: Conchita Paris M.D.   On: 09/18/2014 19:22   I reviewed CXR and CT myself, diffuse ILD noted on CXR and chest CT.  ASSESSMENT / PLAN:  Chronic hypoxic respiratory failure due to underlying severe IPF Potential lung transplant candidate - currently being evaluated / considered at [facility] Plan: Continue supplemental O2 as needed to maintain SpO2 > 92%. Will need home humidifier for the level of O2 flow, discussed with patient and wife and they are calling [facility] home[facility] now. Continue hydrocodone for cough and dyspnea. Albuterol PRN. Ofev on hold until 48 hrs post cath, may restart on 5/13 AM. Cath results to [facility] clinic.  Discussed with bedside RN, ok to discharge from a pulmonary standpoint, RN to contact cards for discharge proceedings.  Please arrange f/u with Dr. Chase Caller within 2 wks from discharge.  PCCM will sign off, please call back if needed.  Rush Farmer, M.D. [facility] Pulmonary/Critical Care Medicine. Pager: [phone]. After hours pager:  [phone].  09/20/2014 10:58 AM

## 2014-09-20 NOTE — Progress Notes (Signed)
ANTICOAGULATION CONSULT NOTE - Follow-up Consult  Pharmacy Consult for Warfarin Indication: St. Jude Mechanical Aortic Valve  Allergies  Allergen Reactions  . Penicillins Swelling and Rash    Patient Measurements: Height: 5\' 11"  (180.3 cm) Weight: 187 lb 1.6 oz (84.868 kg) IBW/kg (Calculated) : 75.3  Vital Signs: Temp: 97.7 F (36.5 C) (05/11 1232) Temp Source: Oral (05/11 1232) BP: 132/68 mmHg (05/11 1100)  Labs:  Recent Labs  09/18/14 1105  09/18/14 1300 09/18/14 1958 09/19/14 0332 09/20/14 0250  HGB  --   < > 11.5*  --  11.7* 11.2*  HCT  --   --  36.0*  --  36.2* 34.7*  PLT  --   --  264  --  243 271  APTT  --   --  30  --   --   --   LABPROT  --   --   --   --   --  13.8  INR 1.1  --   --   --   --  1.05  HEPARINUNFRC  --   --   --  <0.10* 0.15*  --   CREATININE  --   --  0.62  --  0.56* 0.63  < > = values in this interval not displayed.  Estimated Creatinine Clearance: 96.7 mL/min (by C-G formula based on Cr of 0.63).   Medical History: Past Medical History  Diagnosis Date  . Pure hyperglyceridemia   . S/P aortic valve replacement   . Other left bundle branch block   . Other dyspnea and respiratory abnormality   . Obesity, unspecified   . Unspecified arthropathy, lower leg   . Internal hemorrhoids without mention of complication 14/48/1856    Colonoscopy   . Heart murmur   . Pulmonary fibrosis   . GERD (gastroesophageal reflux disease)   . Hemorrhoids   . Arthritis   . Cellulitis of hand, left     possible IV infiltration as source    Medications:  Scheduled:  . albuterol  2.5 mg Nebulization Q4H  . aspirin EC  81 mg Oral Daily  . atorvastatin  80 mg Oral Daily  . celecoxib  200 mg Oral Daily  . docusate sodium  200 mg Oral Daily  . fluticasone  2 spray Each Nare Daily  . losartan  50 mg Oral Daily   And  . hydrochlorothiazide  12.5 mg Oral Daily  . insulin aspart  0-9 Units Subcutaneous TID WC  . methylPREDNISolone (SOLU-MEDROL)  injection  40 mg Intravenous Q12H  . pantoprazole  40 mg Oral Daily  . sodium chloride  3 mL Intravenous Q12H  . sodium chloride  3 mL Intravenous Q12H  . sodium chloride  3 mL Intravenous Q12H  . Vortioxetine HBr  1 tablet Oral Daily  . Warfarin - Pharmacist Dosing Inpatient   Does not apply q1800    Assessment: 41 yoM with increasing cough and dyspnea on warfarin PTA for St Jude Mechanical Aortic Valve. He recently went to [facility] 2 weeks ago to be evaluated for Lung Transplant and were concerned for his heart and wanted Right and Left Cath done. Warfarin has been held since 5/6.  INR on admission (5/9) was 1.1.  Patient bridged with heparin for 1 day and underwent cath on 5/10.  Sheath pulled at ~1200.    INR today is 1.05, as is expected due to lack of warfarin for ~4 days.  Per Dr. Johnsie Cancel, will plan to discharge patient today without  Lovenox bridge and will schedule follow-up INR on Friday, May 13.   Home Dose: Warfarin 10 mg on MWF, 5 mg on TThSatSun. Per patient's wife, he was well managed on this regimen (He follows at an outside clinic so we do not have documentation of appointments).  Goal of Therapy:  INR 2-3 Monitor platelets by anticoagulation protocol: Yes   Plan:  -Give warfarin 10 mg X 1 tonight -Daily INR   Theron Arista, PharmD Clinical Pharmacist - Resident Pager: [phone] 5/11/201612:47 PM

## 2014-09-20 NOTE — Progress Notes (Signed)
DC orders received.  Patient stable with no S/S of distress.  Medication and discharge information reviewed with patient and patient's wife.  Patient instructed to have his INR checked at his PCP's office this Friday 09/22/14.  Patient DC home with wife. Dale [name], Ardeth Sportsman

## 2014-09-20 NOTE — Progress Notes (Addendum)
    OK for DC. Reviewed Dr. Johnsie Cancel note.  Coumadin, no lovenox bridge.  Lungs with classic ILD findings RRR sharp S1 click Cath site normal.   Candee Furbish, MD

## 2014-09-20 NOTE — Discharge Summary (Signed)
Patient Name:  Dale [name]  MRN: [id number]  PCP: Maricela Curet, MD  DOB:  11-24-1945       Date of Admission:  09/18/2014  Date of Discharge:  09/20/2014      Attending Physician: Dr. Josue Hector, MD         DISCHARGE DIAGNOSES 1.   Acute on chronic respiratory failure due to ILD (interstitial lung disease) 2.   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    DISCHARGE MEDICATIONS:   Medication List    TAKE these medications        aspirin EC 81 MG tablet  Take 81 mg by mouth daily.     atorvastatin 80 MG tablet  Commonly known as:  LIPITOR  Take 1 tablet (80 mg total) by mouth daily.     celecoxib 200 MG capsule  Commonly known as:  CELEBREX  Take 1 capsule (200 mg total) by mouth daily.     chlorpheniramine-HYDROcodone 10-8 MG/5ML Suer  Commonly known as:  TUSSIONEX PENNKINETIC ER  Take 5 mLs by mouth 2 (two) times daily as needed for cough.     docusate sodium 100 MG capsule  Commonly known as:  COLACE  Take 200 mg by mouth at bedtime.     fluticasone 50 MCG/ACT nasal spray  Commonly known as:  FLONASE  Place 2 sprays into both nostrils daily.     HYDROcodone-acetaminophen 10-325 MG per tablet  Commonly known as:  NORCO  Take 2 tablets by mouth daily as needed.     losartan-hydrochlorothiazide 50-12.5 MG per tablet  Commonly known as:  HYZAAR  Take 1 tablet by mouth daily.     multivitamin with minerals Tabs tablet  Take 1 tablet by mouth daily.     Nintedanib 150 MG Caps  Commonly known as:  OFEV  Take 150 mg by mouth 2 (two) times daily.  Start taking on:  09/22/2014     omeprazole 20 MG capsule  Commonly known as:  PRILOSEC  Take 1 capsule (20 mg total) by mouth daily.     VENTOLIN HFA 108 (90  BASE) MCG/ACT inhaler  Generic drug:  albuterol  INHALE 2 PUFFS INTO THE LUNGS EVERY 6 HOURS AS NEEDED FOR WHEEZING.     Vortioxetine HBr 10 MG Tabs  Take 10 mg by mouth daily. BRINTELLIX     warfarin 10 MG tablet  Commonly known as:  COUMADIN  Take 5-10 mg by mouth daily. Take 2 tablets (10 mg) on Monday, Wednesday, Friday, take 1 tablet (5 mg) on Sunday, Tuesday, Thursday, Saturday         CONSULTS:   Pulmonology/Critical Care   PROCEDURES PERFORMED:   Cardiac cath right and left:  Dist RCA lesion, 30% stenosed.  Ost RCA to Prox RCA lesion, 40% stenosed.  Prox LAD lesion, 30% stenosed.  Mean RA: 5 mmHg RV: 46/3 mmHg PA: 42/23 mmHg with a mean of 30 mmHg PCWP: 18 mmHg  Fick Cardiac Output: 8.62 L/minute, 4.4 L/minute/M2  PVR : 129 D/S 253 D/DI TPVR 322 D/S SVR: 854 D/S 1673 D/DI  Ao sat 94% PA sat 72% RA sat 75%   1) No significant CAD. Right dominant. 30% stenosis in proximal and distal RCA 30% stenosis in  proximal LAD 2) Mildly elevated right heart pressures PA 42/23 mmHg PVR 129 D/S 253 D/DI 3) Moderate to severely dilated aortic root with no significant peri prosthetic AR Recent high resolution CT for his ILD Showed root to measure 5.1 cm in axial plane 4) Fluoro of St Jude valve in multiple angles appears to show normal bileaflet motion  Dg Chest Port 1 View  09/19/2014   CLINICAL DATA:  Shortness of breath.  EXAM: PORTABLE CHEST - 1 VIEW  COMPARISON:  09/18/2014 .  09/08/2013 .  FINDINGS: Prior CABG. Severe cardiomegaly. Diffuse pulmonary infiltrates again noted. No interim change. A component of chronic underlying interstitial disease again may be present. No pleural effusion or pneumothorax.  IMPRESSION: 1. Prior CABG. Persistent severe cardiomegaly with bilateral diffuse pulmonary infiltrates again noted. No interim change. These changes could be secondary to pulmonary edema and/or bilateral pneumonia. 2. Underlying chronic interstitial lung  disease.   Electronically Signed   By: Dale [name]  Register   On: 09/19/2014 07:22   Dg Chest Port 1 View  09/18/2014   CLINICAL DATA:  Preoperative exam prior to cardiac catheterization. Idiopathic pulmonary fibrosis.  EXAM: PORTABLE CHEST - 1 VIEW  COMPARISON:  09/08/2013  FINDINGS: Interval increase in peripheral pulmonary parenchymal opacity superimposed on chronically prominent interstitial markings is noted. Trace pleural fluid may be present but is poorly visualized at portable AP technique. Evidence of median sternotomy noted. Moderate enlargement of the cardiomediastinal silhouette is identified. No acute osseous finding.  IMPRESSION: Interval increase in predominantly peripheral hazy pulmonary airspace opacity superimposed on chronic interstitial prominence. This could represent progression of the given history of idiopathic pulmonary fibrosis with areas of potentially active disease, although the findings are nonspecific and edema, infection, or other alveolar filling processes could appear similar.   Electronically Signed   By: Dale [name] M.D.   On: 09/18/2014 19:22       ADMISSION DATA: H&P: HPI: 69 y.o. seen in coumadin clinic today at request of Sindy Guadeloupe D. He is s/p Probation officer AV in 1996. Chronic LBBB. 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 Has been on escalating oxygen now requiring close to 10 L On exertion. Increasing cough and dyspnea. Wife took him to [facility] two weeks ago in [place] to be evaluated for Lung transplant. They Were concerned about his heart and wanted right and left cath done. They mentioned crossing his St Jude valve and I told them we would not do this In office today RR 30, some changes in MS. Severe neck pain from long car ride back from [place] Unable to sleep. INR subRx 1.1   Discussed hospitalization for respiratory failure, heparin and pain control Personally I think he will  end of as a palliative care patient but wife is still hoping to get him a lung transplant. Discussed admission with our hospital service Admit to CCU  Will evaluate him in am to see if he is stable enough for right and left cath.   Physical Exam: 130/70 88 afibrile RR 30   Affect appropriate Chronically ill white male  HEENT: normal Neck supple with no adenopathy JVP elevated using accesory muscles  Lungs diffuse velcro crackles with exp wheezing  Heart: U2/P5 click SEM murmur, no rub, gallop or click PMI normal Abdomen: benighn, BS positve, no tenderness, no AAA no bruit. No HSM or HJR Distal pulses intact with no bruits Plus one bilateral edema Neuro non-focal Skin warm  and dry Weak  Somewhat somulent   HOSPITAL COURSE:  Acute on chronic respiratory failure due to ILD:  The patient is currently undergoing evaluation for possible lung transplant at [facility] in [place], [place].  He was admitted with respiratory failure but also had planned for cardiac cath as part of lung transplant evaluation.  Apparently, there is also consideration of heart/lung transplant.  He was stable and able to undergo catheterization on hospital day 2.  His cath did not reveal significant CAD.  His St. Jude valve showed normal bileaflet motion.  The patient was feeling at baseline on the day after cath and felt ready to return home.  Both cardiology and pulmonology felt he was stable for discharge home.  His Ofev was held pre-cath and needs to be held for 48 hours after cath.  He was instructed to resume this medication on 09/22/14.  Follow-up has been arranged with pulmonology on 09/28/14.  They will follow-up with [facility] regarding possibility of transplant.  s/p AV replacement in 1996: The patient's coumadin was resumed 09/19/14.  Cardiology felt there was no need to bridge with lovenox.  The patient has his INR checked at [facility] clinic.  Patient and wife were instructed to get next INR  check on 09/22/14.  The patient with follow-up with Dr. Johnsie Cancel on 09/25/14.   DISCHARGE DATA: Vital Signs: BP 132/68 mmHg  Pulse 94  Temp(Src) 97.7 F (36.5 C) (Oral)  Resp 18  Ht 5\' 11"  (1.803 m)  Wt 187 lb 1.6 oz (84.868 kg)  BMI 26.11 kg/m2  SpO2 98%  Labs: Results for orders placed or performed during the hospital encounter of 09/18/14 (from the past 24 hour(s))  Glucose, capillary     Status: Abnormal   Collection Time: 09/19/14  4:46 PM  Result Value Ref Range   Glucose-Capillary 130 (H) 70 - 99 mg/dL   Comment 1 Notify RN   Glucose, capillary     Status: Abnormal   Collection Time: 09/19/14 11:02 PM  Result Value Ref Range   Glucose-Capillary 158 (H) 70 - 99 mg/dL   Comment 1 Capillary Specimen   Basic metabolic panel     Status: Abnormal   Collection Time: 09/20/14  2:50 AM  Result Value Ref Range   Sodium 138 135 - 145 mmol/L   Potassium 3.9 3.5 - 5.1 mmol/L   Chloride 100 (L) 101 - 111 mmol/L   CO2 28 22 - 32 mmol/L   Glucose, Bld 192 (H) 70 - 99 mg/dL   BUN 12 6 - 20 mg/dL   Creatinine, Ser 0.63 0.61 - 1.24 mg/dL   Calcium 9.0 8.9 - 10.3 mg/dL   GFR calc non Af Amer >60 >60 mL/min   GFR calc Af Amer >60 >60 mL/min   Anion gap 10 5 - 15  CBC     Status: Abnormal   Collection Time: 09/20/14  2:50 AM  Result Value Ref Range   WBC 16.4 (H) 4.0 - 10.5 K/uL   RBC 4.02 (L) 4.22 - 5.81 MIL/uL   Hemoglobin 11.2 (L) 13.0 - 17.0 g/dL   HCT 34.7 (L) 39.0 - 52.0 %   MCV 86.3 78.0 - 100.0 fL   MCH 27.9 26.0 - 34.0 pg   MCHC 32.3 30.0 - 36.0 g/dL   RDW 13.6 11.5 - 15.5 %   Platelets 271 150 - 400 K/uL  Protime-INR     Status: None   Collection Time: 09/20/14  2:50 AM  Result Value Ref  Range   Prothrombin Time 13.8 11.6 - 15.2 seconds   INR 1.05 0.00 - 1.49  Glucose, capillary     Status: Abnormal   Collection Time: 09/20/14  8:18 AM  Result Value Ref Range   Glucose-Capillary 120 (H) 70 - 99 mg/dL     Services Ordered on Discharge: Y = Yes; Blank = No PT:    OT:   RN:   Equipment:   Other:      Time Spent on Discharge: 35 min   Signed: Duwaine Maxin PGY 2, Internal Medicine Resident 09/20/2014, 12:34 PM   Personally seen and examined. Agree with above.  Successful right and left heart cath as above. Continue evaluation for transplant.  Cath site soft, no hematoma Lungs velcro like inspiratory noise (ILD) Mechanical AV click.   Candee Furbish, MD

## 2014-09-20 NOTE — Progress Notes (Signed)
Pt found coughing uncontrolably at about 0745 standing by the sink, intermittant coughing spasms with sats dropping into the 70's, RR into the 40's.  Pt reluctantly agreed to sit down.  O2 increased to 6L per [place], also initialted albuterol neb tx.  Pt coughing up clear phlem.  Albuterol Tx seemed to help.    Carol Ada, RN

## 2014-09-20 NOTE — Progress Notes (Signed)
Cardiology Progress Note  Subjective: Mr. Shells was seen and examined this morning.  He is feeling well and says he feels ready to go home.  He denies chest pain.  He is requiring 6L oxygen (normal home oxygen is 2-3L at rest, 8-10L with exertion).  Objective: Vital signs in last 24 hours: Filed Vitals:   09/20/14 0400 09/20/14 0411 09/20/14 0423 09/20/14 0600  BP: 96/58   113/65  Pulse:      Temp: 97.9 F (36.6 C)     TempSrc: Oral     Resp: 19   23  Height:      Weight:  187 lb 1.6 oz (84.868 kg)    SpO2: 96%  96% 100%   Weight change: -17 lb 10.2 oz (-8 kg)  Intake/Output Summary (Last 24 hours) at 09/20/14 0726 Last data filed at 09/20/14 0400  Gross per 24 hour  Intake  831.2 ml  Output   1125 ml  Net -293.8 ml   General: sitting up in chair at bedside HEENT: [place]/AT, no JVD or carotid bruits  Cardiac: RRR, no rubs or gallops, + aortic click, + systolic murmur (best heard RUSB), intact distal pulses Pulm: diffuse coarse rales (R>L), moving normal volumes of air Abd: soft, nontender, nondistended, BS present Ext: warm and well perfused, no pedal edema, right radial bandage c/d/i Neuro: alert and oriented X3, responding appropriately, moves extremities spontaneously  Lab Results: Basic Metabolic Panel:  Recent Labs Lab 09/19/14 0332 09/20/14 0250  NA 138 138  K 3.9 3.9  CL 99* 100*  CO2 29 28  GLUCOSE 178* 192*  BUN 10 12  CREATININE 0.56* 0.63  CALCIUM 8.9 9.0   Liver Function Tests:  Recent Labs Lab 09/18/14 1300  AST 28  ALT 21  ALKPHOS 66  BILITOT 1.0  PROT 6.9  ALBUMIN 3.3*   CBC:  Recent Labs Lab 09/18/14 1300 09/19/14 0332 09/20/14 0250  WBC 11.1* 9.7 16.4*  NEUTROABS 8.3*  --   --   HGB 11.5* 11.7* 11.2*  HCT 36.0* 36.2* 34.7*  MCV 85.9 85.4 86.3  PLT 264 243 271   CBG:  Recent Labs Lab 09/19/14 0738 09/19/14 1214 09/19/14 1646 09/19/14 2302  GLUCAP 156* 119* 130* 158*   Coagulation:  Recent Labs Lab 09/18/14 1105  09/20/14 0250  LABPROT  --  13.8  INR 1.1 1.05    09/19/14 Cardiac cath:  Dist RCA lesion, 30% stenosed.  Ost RCA to Prox RCA lesion, 40% stenosed.  Prox LAD lesion, 30% stenosed.  1) No significant CAD. Right dominant. 30% stenosis in proximal and distal RCA 30% stenosis in proximal LAD 2) Mildly elevated right heart pressures PA 42/23 mmHg PVR 129 D/S 253 D/DI 3) Moderate to severely dilated aortic root with no significant peri prosthetic AR Recent high resolution CT for his ILD Showed root to measure 5.1 cm in axial plane 4) Fluoro of St Jude valve in multiple angles appears to show normal bileaflet motion  Medications: I have reviewed the patient's current medications. Scheduled Meds: . albuterol  2.5 mg Nebulization Q4H  . aspirin EC  81 mg Oral Daily  . atorvastatin  80 mg Oral Daily  . celecoxib  200 mg Oral Daily  . docusate sodium  200 mg Oral Daily  . fluticasone  2 spray Each Nare Daily  . losartan  50 mg Oral Daily   And  . hydrochlorothiazide  12.5 mg Oral Daily  . insulin aspart  0-9 Units Subcutaneous TID WC  .  methylPREDNISolone (SOLU-MEDROL) injection  40 mg Intravenous Q12H  . pantoprazole  40 mg Oral Daily  . sodium chloride  3 mL Intravenous Q12H  . sodium chloride  3 mL Intravenous Q12H  . sodium chloride  3 mL Intravenous Q12H  . Vortioxetine HBr  1 tablet Oral Daily  . Warfarin - Pharmacist Dosing Inpatient   Does not apply q1800   Continuous Infusions: none PRN Meds:.sodium chloride, sodium chloride, sodium chloride, acetaminophen, albuterol, diazepam, HYDROcodone-acetaminophen, ondansetron (ZOFRAN) IV, oxyCODONE-acetaminophen, sodium chloride, sodium chloride, sodium chloride   Assessment/Plan: 69 year old male with hx of AVR and ILD being  S/p cardiac catheterization to evaluate function in anticipation of lung transplant:  Dr. Kyla Balzarine note reviewed.   - patient ok for d/c home - cath records were sent to [facility] for review -  will arrange follow-up with Dr. Johnsie Cancel as soon as available  AS s/p AVR 20 years ago:  Patient is back on coumadin. - Ok to continue coumadin w/o lovenox bridge - will arrange INR check on 09/22/14  ILD, currently undergoing eval for lung transplant:  Appreciate pulmonology recommendations.  [place] for discharge from pulmonary standpoint. - continue supplemental oxygen, patient to get home humidifier - continue cough medicine, albuterol prn - hold Ofev until 48 hours post cath (plan to restart on 05/13) - will arrange follow-up with Dr. Chase Caller in 2 weeks.    LOS: 2 days   Francesca Oman, DO 09/20/2014, 7:26 AM  Personally seen and examined. Agree with above. Reviewed Dr. Kyla Balzarine comments.  DC home Velcro like lung sounds.  Coumadin, no bridge (mech AV OK) Close follow up.  Discussed with wife.  Transplant w/u Doreen Beam, MD

## 2014-09-22 NOTE — Progress Notes (Signed)
Patient ID: Dale [name], male   DOB: 02/10/1946, 69 y.o.   MRN: [id number] 69 y.o.   s/p St Jude mechanical AV in 1996. Chronic LBBB. 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 Has been on escalating oxygen now requiring close to 10 L on exertion. Increasing cough and dyspnea. Wife took him to [facility] 3 weeks ago in [place] to be evaluated for Lung transplant. They Were concerned about his heart and wanted right and left cath done. They mentioned crossing his St Jude valve and I told them we would not do this  Coumadin held and cath done 09/19/14 with results sent to [facility] Cath reviewed  No significant CAD.  No AR on root injection and normal leaflet motion on fluro.  Normal pulmonary pressures and good cardiac output   Mean RA: 5 mmHg RV: 46/3 mmHg PA: 42/23 mmHg with a mean of 30 mmHg PCWP: 18 mmHg  Fick Cardiac Output: 8.62 L/minute, 4.4 L/minute/M2  PVR : 129 D/S 253 D/DI TPVR 322 D/S SVR: 854 D/S 1673 D/DI  Ao sat 94% PA sat 72% RA sat 75%  Echo reviewed 08/02/13 Study Conclusions  - Left ventricle: The cavity size was mildly dilated. Wall thickness was increased in a pattern of mild LVH. Systolic function was moderately to severely reduced. The estimated ejection fraction was in the range of 30% to 35%. Diffuse hypokinesis. Doppler parameters are consistent with abnormal left ventricular relaxation (grade 1 diastolic dysfunction). Doppler parameters are consistent with high ventricular filling pressure. - Aortic valve: A bioprosthesis was present. There was mild stenosis. Trivial regurgitation. - Ascending aorta: The ascending aorta was mildly dilated. - Mitral valve: Calcified annulus. Mildly thickened leaflets . Mild regurgitation. - Left atrium: The atrium was moderately dilated. - Right ventricle: Systolic function was mildly reduced. - Right atrium: The atrium was mildly to moderately  dilated.   Mean gradient 20 mmHg and peak 34 mmHg   Since d/c doing better back on Rx coumadin  Started ILD drug bid with no bleeding issues.  Less coughing   Have called Dr Serita Kyle to make sure they received his cardiac data.  I think he should be ok to be Considered for lung transplant.    ROS: Denies fever, malais, weight loss, blurry vision, decreased visual acuity, cough, sputum, SOB, hemoptysis, pleuritic pain, palpitaitons, heartburn, abdominal pain, melena, lower extremity edema, claudication, or rash.  All other systems reviewed and negative  General:  Affect appropriate Chronically ill white male  HEENT: normal Neck supple with no adenopathy JVP elevated using accesory muscles  Lungs diffuse velcro crackles with exp wheezing  Heart: E3/M6 click SEM murmur, no rub, gallop or click PMI normal Abdomen: benighn, BS positve, no tenderness, no AAA no bruit. No HSM or HJR Distal pulses intact with no bruits Plus one bilateral edema Neuro non-focal Skin warm and dry Weak   Current Outpatient Prescriptions  Medication Sig Dispense Refill  . aspirin EC 81 MG tablet Take 81 mg by mouth daily.    Marland Kitchen atorvastatin (LIPITOR) 80 MG tablet Take 1 tablet (80 mg total) by mouth daily. 90 tablet 3  . celecoxib (CELEBREX) 200 MG capsule Take 1 capsule (200 mg total) by mouth daily. 90 capsule 3  . chlorpheniramine-HYDROcodone (TUSSIONEX PENNKINETIC ER) 10-8 MG/5ML SUER Take 5 mLs by mouth 2 (two) times daily as needed for cough. 115 mL 0  . docusate sodium (COLACE) 100 MG capsule Take 200 mg  by mouth at bedtime.     . fluticasone (FLONASE) 50 MCG/ACT nasal spray Place 2 sprays into both nostrils daily. 16 g 2  . HYDROcodone-acetaminophen (NORCO) 10-325 MG per tablet Take 2 tablets by mouth daily as needed. (Patient taking differently: Take 1 tablet by mouth every 4 (four) hours as needed (pain). ) 60 tablet 0  . losartan-hydrochlorothiazide (HYZAAR) 50-12.5 MG per tablet  Take 1 tablet by mouth daily. 90 tablet 3  . Multiple Vitamin (MULTIVITAMIN WITH MINERALS) TABS tablet Take 1 tablet by mouth daily.    . Nintedanib (OFEV) 150 MG CAPS Take 150 mg by mouth 2 (two) times daily. 60 capsule 0  . omeprazole (PRILOSEC) 20 MG capsule Take 1 capsule (20 mg total) by mouth daily. (Patient taking differently: Take 40 mg by mouth daily. ) 30 capsule 11  . VENTOLIN HFA 108 (90 BASE) MCG/ACT inhaler INHALE 2 PUFFS INTO THE LUNGS EVERY 6 HOURS AS NEEDED FOR WHEEZING. 18 each 2  . Vortioxetine HBr 10 MG TABS Take 10 mg by mouth daily. BRINTELLIX    . warfarin (COUMADIN) 10 MG tablet Take 5-10 mg by mouth daily. Take 2 tablets (10 mg) on Monday, Wednesday, Friday, take 1 tablet (5 mg) on Sunday, Tuesday, Thursday, Saturday     No current facility-administered medications for this visit.    Allergies  Penicillins  Electrocardiogram:  09/18/14 SR rate 82 LAD/LVH  QT 422 PR 276  Assessment and Plan AVR:  Disc motion ok on fluoro CO over 8 l/min and normal click on exam all suggest normal function Coumadin:  No bleeding issues with new ILD drug did fine with no lovenox bridge  Cath sight A ILD:  Going back to [facility] for further transplant evaluation continue norco, inhalers Vortioxetine and high flow humidified oxgyen CHF:  Resolved filling pressuess ok at cath with PCWP 18  Mild pulmonary hypertension PVR 253  Continue  HCTZ

## 2014-09-25 ENCOUNTER — Ambulatory Visit (INDEPENDENT_AMBULATORY_CARE_PROVIDER_SITE_OTHER): Payer: Medicare Other | Admitting: Cardiovascular Disease

## 2014-09-25 ENCOUNTER — Encounter: Payer: Self-pay | Admitting: Cardiovascular Disease

## 2014-09-25 VITALS — BP 112/60 | HR 96 | Ht 67.0 in | Wt 186.1 lb

## 2014-09-25 DIAGNOSIS — Z952 Presence of prosthetic heart valve: Secondary | ICD-10-CM

## 2014-09-25 DIAGNOSIS — Z954 Presence of other heart-valve replacement: Secondary | ICD-10-CM

## 2014-09-25 NOTE — Patient Instructions (Signed)
Medication Instructions:  NO CHANGES   Labwork: NONE  Testing/Procedures: NONE  Follow-Up: Your physician recommends that you schedule a follow-up appointment in: 2 MONTHS  WITH DR Johnsie Cancel Any Other Special Instructions Will Be Listed Below (If Applicable).

## 2014-09-26 ENCOUNTER — Encounter (HOSPITAL_COMMUNITY): Payer: Self-pay

## 2014-09-28 ENCOUNTER — Ambulatory Visit (INDEPENDENT_AMBULATORY_CARE_PROVIDER_SITE_OTHER): Payer: Medicare Other | Admitting: Adult Health

## 2014-09-28 ENCOUNTER — Ambulatory Visit (INDEPENDENT_AMBULATORY_CARE_PROVIDER_SITE_OTHER)
Admission: RE | Admit: 2014-09-28 | Discharge: 2014-09-28 | Disposition: A | Discharge status: Home / Self Care | Payer: Medicare Other | Source: Ambulatory Visit | Attending: Adult Health | Admitting: Adult Health

## 2014-09-28 ENCOUNTER — Encounter: Payer: Self-pay | Admitting: Adult Health

## 2014-09-28 VITALS — BP 108/70 | HR 86 | Temp 87.5°F | Ht 67.0 in | Wt 189.0 lb

## 2014-09-28 DIAGNOSIS — J189 Pneumonia, unspecified organism: Secondary | ICD-10-CM

## 2014-09-28 DIAGNOSIS — J9611 Chronic respiratory failure with hypoxia: Secondary | ICD-10-CM | POA: Diagnosis not present

## 2014-09-28 DIAGNOSIS — J84112 Idiopathic pulmonary fibrosis: Secondary | ICD-10-CM | POA: Diagnosis not present

## 2014-09-28 NOTE — Patient Instructions (Signed)
Continue on current regimen  follow up Dr. Chase Caller as planned next month

## 2014-09-28 NOTE — Assessment & Plan Note (Signed)
Compensated on O2  

## 2014-09-28 NOTE — Progress Notes (Signed)
Subjective:    Patient ID: Dale [name], male    DOB: 1946/03/08, 69 y.o.   MRN: [id number]  HPI   #IPF   -biopsy proven - Nov 2014 UIP (CT now c/with UIP)   #Progress PFT FVC fev1 ratio BD fev1 TLC DLCO Walk test 185 feet x 3 laps CT Rx  Bx weight  Oct 2014/Nov 2014 2.6L       Possible UIP  UIP nov 2014   March 2015 x      ono - desat but he returned o2  Esbriet + Rehab + No Ofev due to anticoag    Aug 2015 2.47L/54% cough        esbiet+ Increased cough   Oct 2015 2.6 L/63% 2.2 L/71%   3.8 L/58% Could not do       Nov/dec 2015  2.28 L/58% 1.8 L/56% 79/101%    Did not desat Slight progression esbruet + Worsening cough. Dc ace inhjbitor   Jan 2016       Desat +  2nd lap  esbriet + Severe cough worse, more dyspenic   06/01/2014 - [facility]  2.29L/56% - pre [facility]  ... 2.28L/57% @ vitalograph 1.8L/69% vitalograph    12.9/45% @ [facility]   esbrriet +  +  Screening visit [facility] study  FAiled screening due to CT now c/w UIP    07/10/2014 - rsearch afferent cough  1.83L/44% (severe cough) 1.51/47% (severe cough)       Screen failed due to long Qtc    08/15/2014  1/22L/29% (severe cough)      desat at rest per wife but manages with 2L    Estimated body mass index is 29.78 kg/(m^2) as calculated from the following:   Height as of this encounter: 5\' 7"  (1.702 m).   Weight as of this encounter: 190 lb 3.2 oz (86.274 kg).       OV 08/15/2014  Chief Complaint  Patient presents with  . Follow-up    Pt stated his breathing has worsened. Pt c/o increase in SOB, prod cough with white mucus, and chest pain from coughing.       Follow-up idiopathic pulmonary fibrosis and chronic respiratory failure  Last seen January 2016 regular visit. Since then he has had progressive cough and shortness of breath. Cough is pretty clearly severe. He has had multiple episodes of cough during the day particularly early in the morning, after shower and once at night where it makes him  extremely dyspneic and fatigued and the paroxysmal extremely severe and causes muscular skeletal chest pain. He spends most of the day now fatigued and sad entry despite oxygen use. Wife says that he now desaturates even at rest and request 2 L of oxygen. They have been declined transplant at [facility] but currently pursuing [facility] in [place] and [facility] in [place]. They are aware that given his cardiac issues and weight that it is a low probability that he will be a transplant candidate. Today in the office he could not do spirometry properly because of severe cough. Tussionex not helping cough. Gabapentin in the past did not help cough. Vicodin helping cough somewhat    OV 09/18/2014  Chief Complaint  Patient presents with  . Follow-up    Pt c/o increase in SOB. Pt has a cardiac cath on 5/10, pt is on hold with [facility] with transplant team till after cath.     Follow-up  idiopathic pulmonary fibrosis and chronic respiratory failure  Since last seeing a month ago he has been to [facility] in [place]. Wife tells me that [facility] gives most of the history because of his dyspnea is unable to talk] at [facility] did not think his BMI of 30 was a contraindication for transplant unlike [employer]. The overall thought he was a good transplant candidate but apparently much of the dyspnea and cough is felt to be related to his aortic valve disease. Therefore he is back in [place] and is due for a cardiac cath with his cardiologist today/tomorrow. Meanwhile his dyspnea has progressed his cough has progressed. He is visibly more dyspneic. He is also feeling a lot worse. They do admit that he is significantly worse but is still optimistic that they can find a Conservation officer, nature for transplant at [facility] in [place]. Transplant suitability depends on results of cardiac cath and subsequent return to [facility] and further testing  including psychological and financial testing at [facility]. Both heart and lung transference of being considered but with lung transplant a priority  Currently the do not want morphine for symptom relief. Do not [facility] hosp[facility] evaluation. They want refill  on the Hycodan /. Hydrocodone. Ofev specific therapy against IPF has arrived but they were not started due to potential bleeding risks especially with him for cardiac cath today/tomorrow  09/28/2014 [facility] follow up  Pt returns for a follow up from recent admission .  Admitted 5/9 -5/11 for acute on chronic RF with severe ILD. For planned right and left heart cath Underwent card cath with no sign CAD . , St jude valve with motion , normal PAP , good cardiac output  Currently undergoing evaluation at [facility] clinic for possible lung transplant .  Awaiting call from [facility] for additional testing .  Breathing is at his baseline. O2 ranges 2-4 L rest and 6-10L with act  No flare of cough or dyspnea. No increased O2 needs Started OFEV last week , denies GI issues.     Review of Systems  Constitutional: Negative for fever and unexpected weight change.  HENT: Negative for congestion, dental problem, ear pain, nosebleeds, postnasal drip, rhinorrhea, sinus pressure, sneezing, sore throat and trouble swallowing.   Eyes: Negative for redness and itching.  Respiratory: Positive for cough and shortness of breath. Negative for chest tightness and wheezing.   Cardiovascular: Negative for palpitations and leg swelling.  Gastrointestinal: Negative for nausea and vomiting.  Genitourinary: Negative for dysuria.  Musculoskeletal: Negative for joint swelling.  Skin: Negative for rash.  Neurological: Negative for headaches.  Hematological: Does not bruise/bleed easily.  Psychiatric/Behavioral: Negative for dysphoric mood. The patient is not nervous/anxious.        Objective:   Physical Exam  Constitutional: He is oriented to person, place, and  time. He appears well-developed and well-nourished. No distress.  HENT:  Head: Normocephalic and atraumatic.  Right Ear: External ear normal.  Left Ear: External ear normal.  Mouth/Throat: Oropharynx is clear and moist. No oropharyngeal exudate.  Eyes: Conjunctivae and EOM are normal. Pupils are equal, round, and reactive to light. Right eye exhibits no discharge. Left eye exhibits no discharge. No scleral icterus.  Neck: Normal range of motion. Neck supple. No JVD present. No tracheal deviation present. No thyromegaly present.  Cardiovascular: Normal rate, regular rhythm and intact distal pulses.  Exam reveals no gallop and no friction rub.   No murmur heard. Pulmonary/Chest:   He has rales. He exhibits  no tenderness.  Abdominal: Soft. Bowel sounds are normal. He exhibits no distension and no mass. There is no tenderness. There is no rebound and no guarding.  Musculoskeletal: Normal range of motion. He exhibits no edema or tenderness.  Lymphadenopathy:    He has no cervical adenopathy.  Neurological: He is alert and oriented to person, place, and time. He has normal reflexes. No cranial nerve deficit. Coordination normal.  Skin: Skin is warm and dry. No rash noted. He is not diaphoretic. No erythema. No pallor.  Psychiatric:       Assessment & Plan:

## 2014-09-28 NOTE — Assessment & Plan Note (Signed)
Severe IPF with high flow O2 needs undergoing evaluation for possible lung transplant at [facility] clinic in [place].  Cont on OFEV , follow up in 4 weeks as planned   Plan  Cont on current regimen

## 2014-10-11 ENCOUNTER — Telehealth: Payer: Self-pay | Admitting: Cardiovascular Disease

## 2014-10-11 NOTE — Telephone Encounter (Signed)
New problem   Pt's daughter need to speak to nurse concerning medical records she need today from his heart cath he had done. Daughter stated [facility]aud[facility] Mercury wouldn't help her and stated she needed to call [facility] where the procedure was done.Pt's daughter need to speak to nurse b/c she need this notes faxed today by 12:00. Please call.

## 2014-10-11 NOTE — Telephone Encounter (Signed)
PT'S DAUGHTER AWARE  THAT  CATH REPORT  WAS  FAXED TO  TRANSPLANT  COORDINATOR  [place]  [phone] AND  PHONE NUMBER  IS [phone] AS  REQUESTED .Adonis Housekeeper

## 2014-10-15 ENCOUNTER — Emergency Department (HOSPITAL_COMMUNITY): Payer: Medicare Other

## 2014-10-15 ENCOUNTER — Encounter (HOSPITAL_COMMUNITY): Payer: Self-pay | Admitting: *Deleted

## 2014-10-15 ENCOUNTER — Other Ambulatory Visit: Payer: Self-pay | Admitting: Internal Medicine

## 2014-10-15 ENCOUNTER — Inpatient Hospital Stay (HOSPITAL_COMMUNITY)
Admission: EM | Admit: 2014-10-15 | Discharge: 2014-10-17 | DRG: 196 | Disposition: A | Discharge status: Home / Self Care | Payer: Medicare Other | Attending: Internal Medicine | Admitting: Internal Medicine

## 2014-10-15 DIAGNOSIS — M129 Arthropathy, unspecified: Secondary | ICD-10-CM | POA: Diagnosis present

## 2014-10-15 DIAGNOSIS — Z7901 Long term (current) use of anticoagulants: Secondary | ICD-10-CM | POA: Diagnosis not present

## 2014-10-15 DIAGNOSIS — J9620 Acute and chronic respiratory failure, unspecified whether with hypoxia or hypercapnia: Secondary | ICD-10-CM | POA: Diagnosis present

## 2014-10-15 DIAGNOSIS — R531 Weakness: Secondary | ICD-10-CM

## 2014-10-15 DIAGNOSIS — Z952 Presence of prosthetic heart valve: Secondary | ICD-10-CM

## 2014-10-15 DIAGNOSIS — R630 Anorexia: Secondary | ICD-10-CM | POA: Diagnosis present

## 2014-10-15 DIAGNOSIS — Z87891 Personal history of nicotine dependence: Secondary | ICD-10-CM

## 2014-10-15 DIAGNOSIS — J9621 Acute and chronic respiratory failure with hypoxia: Secondary | ICD-10-CM

## 2014-10-15 DIAGNOSIS — Z96651 Presence of right artificial knee joint: Secondary | ICD-10-CM | POA: Diagnosis present

## 2014-10-15 DIAGNOSIS — Z7982 Long term (current) use of aspirin: Secondary | ICD-10-CM

## 2014-10-15 DIAGNOSIS — I2781 Cor pulmonale (chronic): Secondary | ICD-10-CM | POA: Diagnosis present

## 2014-10-15 DIAGNOSIS — I10 Essential (primary) hypertension: Secondary | ICD-10-CM | POA: Diagnosis present

## 2014-10-15 DIAGNOSIS — Z880 Allergy status to penicillin: Secondary | ICD-10-CM

## 2014-10-15 DIAGNOSIS — E876 Hypokalemia: Secondary | ICD-10-CM | POA: Diagnosis present

## 2014-10-15 DIAGNOSIS — Z6828 Body mass index (BMI) 28.0-28.9, adult: Secondary | ICD-10-CM

## 2014-10-15 DIAGNOSIS — J84112 Idiopathic pulmonary fibrosis: Secondary | ICD-10-CM | POA: Diagnosis not present

## 2014-10-15 DIAGNOSIS — G8929 Other chronic pain: Secondary | ICD-10-CM | POA: Diagnosis present

## 2014-10-15 DIAGNOSIS — R0602 Shortness of breath: Secondary | ICD-10-CM

## 2014-10-15 DIAGNOSIS — E781 Pure hyperglyceridemia: Secondary | ICD-10-CM | POA: Diagnosis present

## 2014-10-15 DIAGNOSIS — I5043 Acute on chronic combined systolic (congestive) and diastolic (congestive) heart failure: Secondary | ICD-10-CM | POA: Diagnosis present

## 2014-10-15 DIAGNOSIS — R0600 Dyspnea, unspecified: Secondary | ICD-10-CM | POA: Diagnosis not present

## 2014-10-15 DIAGNOSIS — F419 Anxiety disorder, unspecified: Secondary | ICD-10-CM | POA: Diagnosis present

## 2014-10-15 DIAGNOSIS — I429 Cardiomyopathy, unspecified: Secondary | ICD-10-CM | POA: Diagnosis present

## 2014-10-15 DIAGNOSIS — J9690 Respiratory failure, unspecified, unspecified whether with hypoxia or hypercapnia: Secondary | ICD-10-CM

## 2014-10-15 LAB — PROCALCITONIN

## 2014-10-15 LAB — CBC
HCT: 36.4 % — ABNORMAL LOW (ref 39.0–52.0)
HEMOGLOBIN: 11.7 g/dL — AB (ref 13.0–17.0)
MCH: 26.7 pg (ref 26.0–34.0)
MCHC: 32.1 g/dL (ref 30.0–36.0)
MCV: 83.1 fL (ref 78.0–100.0)
Platelets: 408 10*3/uL — ABNORMAL HIGH (ref 150–400)
RBC: 4.38 MIL/uL (ref 4.22–5.81)
RDW: 14.1 % (ref 11.5–15.5)
WBC: 13.4 10*3/uL — AB (ref 4.0–10.5)

## 2014-10-15 LAB — BASIC METABOLIC PANEL
Anion gap: 9 (ref 5–15)
BUN: 6 mg/dL (ref 6–20)
CALCIUM: 8.8 mg/dL — AB (ref 8.9–10.3)
CO2: 31 mmol/L (ref 22–32)
Chloride: 98 mmol/L — ABNORMAL LOW (ref 101–111)
Creatinine, Ser: 0.51 mg/dL — ABNORMAL LOW (ref 0.61–1.24)
GFR calc Af Amer: >60 mL/min (ref >60)
GLUCOSE: 112 mg/dL — AB (ref 65–99)
POTASSIUM: 3.5 mmol/L (ref 3.5–5.1)
SODIUM: 138 mmol/L (ref 135–145)

## 2014-10-15 LAB — LACTIC ACID, PLASMA: LACTIC ACID, VENOUS: 1.3 mmol/L (ref 0.5–2.0)

## 2014-10-15 LAB — I-STAT TROPONIN, ED: Troponin i, poc: 0.02 ng/mL (ref 0.00–0.08)

## 2014-10-15 LAB — PROTIME-INR
INR: 2.61 — ABNORMAL HIGH (ref 0.00–1.49)
Prothrombin Time: 27.5 seconds — ABNORMAL HIGH (ref 11.6–15.2)

## 2014-10-15 LAB — TROPONIN I: Troponin I: 0.05 ng/mL — ABNORMAL HIGH (ref <0.031)

## 2014-10-15 LAB — BRAIN NATRIURETIC PEPTIDE: B Natriuretic Peptide: 727.2 pg/mL — ABNORMAL HIGH (ref 0.0–100.0)

## 2014-10-15 MED ORDER — ASPIRIN EC 81 MG PO TBEC
81.0000 mg | DELAYED_RELEASE_TABLET | Freq: Every day | ORAL | Status: DC
  Administered 2014-10-16 – 2014-10-17 (×3): 81 mg via ORAL
  Filled 2014-10-15 (×2): qty 1

## 2014-10-15 MED ORDER — SODIUM CHLORIDE 0.9 % IV SOLN: 250.0000 mL | INTRAVENOUS | Status: DC | PRN

## 2014-10-15 MED ORDER — HYDROCOD POLST-CPM POLST ER 10-8 MG/5ML PO SUER
5.0000 mL | Freq: Two times a day (BID) | ORAL | Status: DC | PRN
  Administered 2014-10-15 – 2014-10-17 (×2): 5 mL via ORAL
  Filled 2014-10-15 (×2): qty 5

## 2014-10-15 MED ORDER — HYDROCODONE-ACETAMINOPHEN 10-325 MG PO TABS
1.0000 | ORAL_TABLET | Freq: Four times a day (QID) | ORAL | Status: DC | PRN
  Administered 2014-10-16 – 2014-10-17 (×3): 1 via ORAL
  Filled 2014-10-15 (×3): qty 1

## 2014-10-15 MED ORDER — SODIUM CHLORIDE 0.9 % IJ SOLN: 3.0000 mL | INTRAMUSCULAR | Status: DC | PRN

## 2014-10-15 MED ORDER — WARFARIN SODIUM 5 MG PO TABS
5.0000 mg | ORAL_TABLET | Freq: Once | ORAL | Status: AC
Start: 2014-10-15 — End: 2014-10-15
  Administered 2014-10-15: 5 mg via ORAL
  Filled 2014-10-15 (×2): qty 1

## 2014-10-15 MED ORDER — SODIUM CHLORIDE 0.9 % IV BOLUS (SEPSIS)
500.0000 mL | Freq: Once | INTRAVENOUS | Status: AC
  Administered 2014-10-15: 500 mL via INTRAVENOUS

## 2014-10-15 MED ORDER — IPRATROPIUM-ALBUTEROL 0.5-2.5 (3) MG/3ML IN SOLN
3.0000 mL | Freq: Four times a day (QID) | RESPIRATORY_TRACT | Status: DC
  Administered 2014-10-16 – 2014-10-17 (×5): 3 mL via RESPIRATORY_TRACT
  Filled 2014-10-15 (×6): qty 3

## 2014-10-15 MED ORDER — FLUTICASONE PROPIONATE 50 MCG/ACT NA SUSP
2.0000 | Freq: Every day | NASAL | Status: DC
Start: 2014-10-15 — End: 2014-10-17
  Administered 2014-10-15 – 2014-10-17 (×3): 2 via NASAL
  Filled 2014-10-15: qty 16

## 2014-10-15 MED ORDER — ONDANSETRON HCL 4 MG PO TABS
4.0000 mg | ORAL_TABLET | Freq: Four times a day (QID) | ORAL | Status: DC | PRN
Start: 2014-10-15 — End: 2014-10-17

## 2014-10-15 MED ORDER — ACETAMINOPHEN 325 MG PO TABS
650.0000 mg | ORAL_TABLET | Freq: Four times a day (QID) | ORAL | Status: DC | PRN
Start: 2014-10-15 — End: 2014-10-17
  Filled 2014-10-15: qty 2

## 2014-10-15 MED ORDER — POTASSIUM CHLORIDE CRYS ER 20 MEQ PO TBCR
40.0000 meq | EXTENDED_RELEASE_TABLET | Freq: Once | ORAL | Status: AC
  Administered 2014-10-15: 40 meq via ORAL
  Filled 2014-10-15: qty 2

## 2014-10-15 MED ORDER — ALBUTEROL SULFATE (2.5 MG/3ML) 0.083% IN NEBU
2.5000 mg | INHALATION_SOLUTION | RESPIRATORY_TRACT | Status: DC | PRN
  Administered 2014-10-15: 2.5 mg via RESPIRATORY_TRACT
  Filled 2014-10-15: qty 3

## 2014-10-15 MED ORDER — FUROSEMIDE 10 MG/ML IJ SOLN
40.0000 mg | Freq: Once | INTRAMUSCULAR | Status: AC
  Administered 2014-10-15: 40 mg via INTRAVENOUS
  Filled 2014-10-15: qty 4

## 2014-10-15 MED ORDER — POLYETHYLENE GLYCOL 3350 17 G PO PACK
17.0000 g | PACK | Freq: Every day | ORAL | Status: DC | PRN
  Filled 2014-10-15: qty 1

## 2014-10-15 MED ORDER — ACETAMINOPHEN 650 MG RE SUPP: 650.0000 mg | Freq: Four times a day (QID) | RECTAL | Status: DC | PRN

## 2014-10-15 MED ORDER — ONDANSETRON HCL 4 MG/2ML IJ SOLN
4.0000 mg | Freq: Four times a day (QID) | INTRAMUSCULAR | Status: DC | PRN
  Administered 2014-10-15: 4 mg via INTRAVENOUS
  Filled 2014-10-15: qty 2

## 2014-10-15 MED ORDER — WARFARIN - PHARMACIST DOSING INPATIENT
Freq: Every day | Status: DC
  Administered 2014-10-16: 1

## 2014-10-15 MED ORDER — SODIUM CHLORIDE 0.9 % IJ SOLN
3.0000 mL | Freq: Two times a day (BID) | INTRAMUSCULAR | Status: DC
  Administered 2014-10-15 – 2014-10-17 (×4): 3 mL via INTRAVENOUS

## 2014-10-15 MED ORDER — PANTOPRAZOLE SODIUM 40 MG PO TBEC
40.0000 mg | DELAYED_RELEASE_TABLET | Freq: Every day | ORAL | Status: DC
  Administered 2014-10-15 – 2014-10-17 (×3): 40 mg via ORAL
  Filled 2014-10-15 (×3): qty 1

## 2014-10-15 MED ORDER — ATORVASTATIN CALCIUM 80 MG PO TABS
80.0000 mg | ORAL_TABLET | Freq: Every day | ORAL | Status: DC
  Administered 2014-10-15 – 2014-10-17 (×3): 80 mg via ORAL
  Filled 2014-10-15 (×3): qty 1

## 2014-10-15 MED ORDER — ADULT MULTIVITAMIN W/MINERALS CH
1.0000 | ORAL_TABLET | Freq: Every day | ORAL | Status: DC
  Administered 2014-10-16 – 2014-10-17 (×2): 1 via ORAL
  Filled 2014-10-15 (×2): qty 1

## 2014-10-15 MED ORDER — NINTEDANIB ESYLATE 150 MG PO CAPS: 150.0000 mg | ORAL_CAPSULE | Freq: Two times a day (BID) | ORAL | Status: DC

## 2014-10-15 NOTE — ED Notes (Signed)
Pt states that he has had increasing SOB and leg swelling over the last 3 days. Pt states that he was recently started on a new medication and feels that it is causing nausea and loss of appetite.

## 2014-10-15 NOTE — ED Provider Notes (Signed)
CSN: [id number]     Arrival date & time 10/15/14  1340 History   First MD Initiated Contact with Patient 10/15/14 1357     Chief Complaint  Patient presents with  . Shortness of Breath     (Consider location/radiation/quality/duration/timing/severity/associated sxs/prior Treatment) HPI Dale [name] is a 69 y.o. male with history of end-stage IPF, status post aortic valve replacement, CHF, presents to emergency department complaining of nausea, vomiting, swelling in bilateral lower extremities. Patient was started on OFEV about a month ago, wife believes that his symptoms may be related to this drug side effect. Patient has hives several episodes of vomiting, mostly nausea, no appetite, flushing, feeling hot, and lower extremity swelling which is new for him. He states breathing is about the same. Patient is on chronic 6 L oxygen. He states he has been feeling weaker, and now does not think he can walk given several steps. Patient is currently awaiting possible lung transplant in May at [facility], just had cardiac evaluation and was cleared. Patient does admit to anxiety. States cough is dry, nonproductive, unchanged. Wife stated the patient has not eaten anything in 3 days. He continues to drink plenty of fluids.   Past Medical History  Diagnosis Date  . Pure hyperglyceridemia   . S/P aortic valve replacement   . Other left bundle branch block   . Other dyspnea and respiratory abnormality   . Obesity, unspecified   . Unspecified arthropathy, lower leg   . Internal hemorrhoids without mention of complication 00/76/2263    Colonoscopy   . Heart murmur   . Pulmonary fibrosis   . GERD (gastroesophageal reflux disease)   . Hemorrhoids   . Arthritis   . Cellulitis of hand, left     possible IV infiltration as source   Past Surgical History  Procedure Laterality Date  . Circumcision  05/01/2006  . Shoulder arthroscopy  11/14/2004    Left shoulder  . Pacemaker removal  09/03/00     followed by removal of both atreal and ventricular pacing leads in a  patient who had evidence of pacemaker lead crush as well as pocket infection  . Aortic valve replacement    . Right knee    . Joint replacement Right     partial  . Video assisted thoracoscopy Right 04/05/2013    Procedure: RIGHT VIDEO ASSISTED THORACOSCOPY,RIGHT MIDDLE LOBE & RIGHT UPPER LOBE LUNG BIOPSY;  Surgeon: Ivin Poot, MD;  Location: [facility];  Service: Thoracic;  Laterality: Right;  . Cardiac catheterization N/A 09/19/2014    Procedure: Right/Left Heart Cath and Coronary Angiography;  Surgeon: Josue Hector, MD;  Location: [facility];  Service: Cardiovascular;  Laterality: N/A;   Family History  Problem Relation Age of Onset  . Uterine cancer Mother   . Diabetes Sister    History  Substance Use Topics  . Smoking status: Former Smoker -- 1.50 packs/day for 10 years    Types: Cigarettes    Quit date: 05/12/1973  . Smokeless tobacco: Never Used  . Alcohol Use: 0.0 oz/week     Comment: 2-3 beers occassionally    Review of Systems  Constitutional: Negative for fever and chills.  Respiratory: Positive for cough, shortness of breath and wheezing. Negative for chest tightness.   Cardiovascular: Positive for leg swelling. Negative for chest pain and palpitations.  Gastrointestinal: Positive for nausea and vomiting. Negative for diarrhea, constipation and abdominal distention.  Genitourinary: Negative for dysuria, urgency, frequency and hematuria.  Musculoskeletal: Positive  for myalgias. Negative for arthralgias, neck pain and neck stiffness.  Skin: Negative for rash.  Allergic/Immunologic: Negative for immunocompromised state.  Neurological: Negative for dizziness, weakness, light-headedness, numbness and headaches.  All other systems reviewed and are negative.     Allergies  Penicillins  Home Medications   Prior to Admission medications   Medication Sig Start Date End Date Taking?  Authorizing Provider  aspirin EC 81 MG tablet Take 81 mg by mouth daily.    Historical Provider, MD  atorvastatin (LIPITOR) 80 MG tablet Take 1 tablet (80 mg total) by mouth daily. 04/25/14   Josue Hector, MD  celecoxib (CELEBREX) 200 MG capsule Take 1 capsule (200 mg total) by mouth daily. 04/25/14   Josue Hector, MD  chlorpheniramine-HYDROcodone [facility] PENNKINETIC ER) 10-8 MG/5ML SUER Take 5 mLs by mouth 2 (two) times daily as needed for cough. 09/18/14   Brand Males, MD  docusate sodium (COLACE) 100 MG capsule Take 200 mg by mouth at bedtime.     Historical Provider, MD  fluticasone (FLONASE) 50 MCG/ACT nasal spray Place 2 sprays into both nostrils daily. 05/24/14   Brand Males, MD  HYDROcodone-acetaminophen (NORCO) 10-325 MG per tablet Take 2 tablets by mouth daily as needed. Patient taking differently: Take 1 tablet by mouth every 4 (four) hours as needed (pain).  09/18/14   Brand Males, MD  losartan-hydrochlorothiazide (HYZAAR) 50-12.5 MG per tablet Take 1 tablet by mouth daily. 04/25/14   Josue Hector, MD  Multiple Vitamin (MULTIVITAMIN WITH MINERALS) TABS tablet Take 1 tablet by mouth daily.    Historical Provider, MD  naproxen (NAPROSYN) 500 MG tablet Take 2 tablets by mouth 2 (two) times daily as needed. 09/27/14   Historical Provider, MD  Nintedanib (OFEV) 150 MG CAPS Take 150 mg by mouth 2 (two) times daily. 09/22/14   Francesca Oman, DO  omeprazole (PRILOSEC) 20 MG capsule Take 1 capsule (20 mg total) by mouth daily. Patient taking differently: Take 40 mg by mouth daily.  03/21/14   Brand Males, MD  VENTOLIN HFA 108 (90 BASE) MCG/ACT inhaler INHALE 2 PUFFS INTO THE LUNGS EVERY 6 HOURS AS NEEDED FOR WHEEZING. 04/24/14   Brand Males, MD  Vortioxetine HBr 10 MG TABS Take 10 mg by mouth daily. BRINTELLIX    Historical Provider, MD  warfarin (COUMADIN) 10 MG tablet Take 5-10 mg by mouth daily. Take 2 tablets (10 mg) on Monday, Wednesday, Friday, take 1 tablet (5  mg) on Sunday, Tuesday, Thursday, Saturday    Historical Provider, MD   BP 146/98 mmHg  Pulse 93  Temp(Src) 97.8 F (36.6 C) (Oral)  Resp 22  SpO2 88% Physical Exam  Constitutional: He is oriented to person, place, and time. He appears well-developed and well-nourished. No distress.  HENT:  Head: Normocephalic and atraumatic.  Eyes: Conjunctivae and EOM are normal.  Neck: Normal range of motion. Neck supple.  Cardiovascular: Normal rate, regular rhythm and normal heart sounds.   Pulmonary/Chest: Effort normal. He has wheezes. He has rales.  Abdominal: Soft. There is no tenderness.  Musculoskeletal:  Trace edema of bilateral ankles and feet  Neurological: He is alert and oriented to person, place, and time.  Skin: Skin is warm and dry.  Nursing note and vitals reviewed.   ED Course  Procedures (including critical care time) Labs Review Labs Reviewed  CBC - Abnormal; Notable for the following:    WBC 13.4 (*)    Hemoglobin 11.7 (*)    HCT 36.4 (*)  Platelets 408 (*)    All other components within normal limits  BASIC METABOLIC PANEL - Abnormal; Notable for the following:    Chloride 98 (*)    Glucose, Bld 112 (*)    Creatinine, Ser 0.51 (*)    Calcium 8.8 (*)    All other components within normal limits  BRAIN NATRIURETIC PEPTIDE - Abnormal; Notable for the following:    B Natriuretic Peptide 727.2 (*)    All other components within normal limits  PROTIME-INR - Abnormal; Notable for the following:    Prothrombin Time 27.5 (*)    INR 2.61 (*)    All other components within normal limits  I-STAT TROPOININ, ED    Imaging Review Dg Chest 2 View  10/15/2014   CLINICAL DATA:  Increasing shortness of breath and leg swelling over the last 3 days. History congestive heart failure.  EXAM: CHEST  2 VIEW  COMPARISON:  09/28/2014 and 09/18/2014 and 09/08/2013  FINDINGS: There is chronic cardiomegaly. There is progressive consolidation at both lung bases with diffuse bilateral  pulmonary infiltrates, increasing. There are no appreciable effusions in the pulmonary vascularity appears normal. No osseous abnormality. Previous median sternotomy. Previous aortic valve replacement. The no acute osseous abnormality.  IMPRESSION: Progressive diffuse bilateral pulmonary infiltrates. The possibility of ARDS superimposed on chronic lung disease should be considered.   Electronically Signed   By: Lorriane Shire M.D.   On: 10/15/2014 14:52     EKG Interpretation   Date/Time:  Sunday October 15 2014 13:48:44 EDT Ventricular Rate:  93 PR Interval:  236 QRS Duration: 114 QT Interval:  408 QTC Calculation: 507 R Axis:   -24 Text Interpretation:  Sinus rhythm with 1st degree A-V block with  Premature atrial complexes Moderate voltage criteria for LVH, may be  normal variant Prolonged QT Abnormal ECG No significant change since last  tracing Confirmed by ALLEN  MD, ANTHONY ([id number]) on 10/15/2014 3:09:06 PM      MDM   Final diagnoses:  Weakness  Anorexia  Shortness of breath    Patient with chronic pulmonary disease, end-stage. He is here with worsening shortness of breath as well as generalized weakness, lower extremity swelling, nausea, anorexia. Patient appears to be uncomfortable and anxious. Will get labs, chest x-ray, will call pulmonary specialist.   4:31 PM Spoke with pulmonary. Dr. Chase Caller to come and admit patient. Patient informed of the plan. He agrees to stay. VS stable. Pt continue to appear uncomfortable. 566mL of fluids given initially for possible dehydration given patient has not been eating over the last 3 days, however stopped when the chest x-ray and BNP resulted.  Filed Vitals:   10/15/14 1515 10/15/14 1530 10/15/14 1540 10/15/14 1600  BP: 126/76 134/85 134/85 142/75  Pulse: 95 93 95 97  Temp:      TempSrc:      Resp: 30 25 16 21   SpO2: 91% 94% 92% 92%          Jeannett Senior, PA-C 10/15/14 1633

## 2014-10-15 NOTE — ED Notes (Signed)
Patient transported to X-ray 

## 2014-10-15 NOTE — Progress Notes (Signed)
ANTICOAGULATION CONSULT NOTE - Follow Up Consult  Pharmacy Consult for Warfarin Indication: St Jude's Mechanical Aortic Valve  Allergies  Allergen Reactions  . Penicillins Swelling and Rash    Patient Measurements:    Vital Signs: Temp: 97.8 F (36.6 C) (06/05 1347) Temp Source: Oral (06/05 1347) BP: 133/74 mmHg (06/05 1645) Pulse Rate: 94 (06/05 1645)  Labs:  Recent Labs  10/15/14 1353 10/15/14 1504  HGB 11.7*  --   HCT 36.4*  --   PLT 408*  --   LABPROT  --  27.5*  INR  --  2.61*  CREATININE 0.51*  --     CrCl cannot be calculated (Unknown ideal weight.).   Medications:  Warfarin 10 mg on MWF, 5 mg on TThSatSun; last dose taken sometime in the past week  Assessment: 69 year old male with pulmonary fibrosis admitted with shortness of breath.  He is on chronic anticoagulation with Coumadin for St. Jude's mechanical aortic valve and his INR is currently therapeutic.  Goal of Therapy:  INR 2-3   Plan:  Coumadin 5mg  today, his usual Sunday dose Daily PT/INR monitoring  Legrand Como, Pharm.D., BCPS, AAHIVP Clinical Pharmacist Phone: [phone] or [phone] 10/15/2014, 4:59 PM

## 2014-10-15 NOTE — H&P (Signed)
Name: Dale [name] MRN: [id number] DOB: 24-Mar-1946    ADMISSION DATE:  10/15/2014  REFERRING MD :  EDP  CHIEF COMPLAINT:  Dyspnea   BRIEF PATIENT DESCRIPTION: 69yo male well known to Dr. Chase Caller in pulmonary clinic for pulmonary fibrosis (UIP, bx confirmed 2014) currently undergoing workup at [facility] for possible transplant. Also has hx nonischemic cardiomyopathy , aortic valve replacement on coumadin.  Presented 6/5 with 3 day hx increased SOB, hypoxia, fatigue, anorexia.  PCCM called to admit.   SIGNIFICANT EVENTS    STUDIES:     HISTORY OF PRESENT ILLNESS:  69yo male well known to Dr. Chase Caller in pulmonary clinic for pulmonary fibrosis (UIP, bx confirmed 2014) currently undergoing workup at [facility] for possible transplant.  Presented 6/5 with 3 day hx increased SOB, hypoxia, fatigue, anorexia.  Recently changed to OFEV from pirfenidone.  Fatigue has been biggest complaint over last 3-4 days.  Thursday began having nausea, emesis x 1 and anorexia.  Mild increase in SOB above baseline and has noted more hypoxia on his normal 6-10L.  Denies chest pain, fevers, purulent sputum, hemoptysis.  Does also c/o BLE edema, mild orthopnea.  *stopped OFEV 3-4 days ago when s/s began because he was afraid these were medication side effects  PAST MEDICAL HISTORY :   has a past medical history of Pure hyperglyceridemia; S/P aortic valve replacement; Other left bundle branch block; Other dyspnea and respiratory abnormality; Obesity, unspecified; Unspecified arthropathy, lower leg; Internal hemorrhoids without mention of complication (95/18/8416); Heart murmur; Pulmonary fibrosis; GERD (gastroesophageal reflux disease); Hemorrhoids; Arthritis; and Cellulitis of hand, left.  has past surgical history that includes Circumcision (05/01/2006); Shoulder arthroscopy (11/14/2004); Pacemaker removal (09/03/00); Aortic valve replacement; right knee; Joint replacement (Right); Video assisted thoracoscopy  (Right, 04/05/2013); and Cardiac catheterization (N/A, 09/19/2014). Prior to Admission medications   Medication Sig Start Date End Date Taking? Authorizing Provider  aspirin EC 81 MG tablet Take 81 mg by mouth daily.   Yes Historical Provider, MD  atorvastatin (LIPITOR) 80 MG tablet Take 1 tablet (80 mg total) by mouth daily. 04/25/14  Yes Josue Hector, MD  celecoxib (CELEBREX) 200 MG capsule Take 1 capsule (200 mg total) by mouth daily. 04/25/14  Yes Josue Hector, MD  chlorpheniramine-HYDROcodone [facility] PENNKINETIC ER) 10-8 MG/5ML SUER Take 5 mLs by mouth 2 (two) times daily as needed for cough. 09/18/14  Yes Brand Males, MD  fluticasone (FLONASE) 50 MCG/ACT nasal spray Place 2 sprays into both nostrils daily. 05/24/14  Yes Brand Males, MD  HYDROcodone-acetaminophen (NORCO) 10-325 MG per tablet Take 2 tablets by mouth daily as needed. Patient taking differently: Take 1 tablet by mouth 2 (two) times daily.  09/18/14  Yes Brand Males, MD  losartan-hydrochlorothiazide (HYZAAR) 50-12.5 MG per tablet Take 1 tablet by mouth daily. 04/25/14  Yes Josue Hector, MD  Multiple Vitamin (MULTIVITAMIN WITH MINERALS) TABS tablet Take 1 tablet by mouth daily.   Yes Historical Provider, MD  naproxen (NAPROSYN) 500 MG tablet Take 2 tablets by mouth 2 (two) times daily as needed for moderate pain.  09/27/14  Yes Historical Provider, MD  Nintedanib (OFEV) 150 MG CAPS Take 150 mg by mouth 2 (two) times daily. 09/22/14  Yes Alex Ronnie Derby, DO  omeprazole (PRILOSEC) 20 MG capsule Take 1 capsule (20 mg total) by mouth daily. Patient taking differently: Take 40 mg by mouth daily.  03/21/14  Yes Brand Males, MD  VENTOLIN HFA 108 (90 BASE) MCG/ACT inhaler INHALE 2 PUFFS INTO THE  LUNGS EVERY 6 HOURS AS NEEDED FOR WHEEZING. 04/24/14  Yes Brand Males, MD  Vortioxetine HBr 10 MG TABS Take 10 mg by mouth daily. BRINTELLIX   Yes Historical Provider, MD  warfarin (COUMADIN) 10 MG tablet Take 5-10 mg by  mouth daily. Take 2 tablets (10 mg) on Monday, Wednesday, Friday, take 1 tablet (5 mg) on Sunday, Tuesday, Thursday, Saturday   Yes Historical Provider, MD   Allergies  Allergen Reactions  . Penicillins Swelling and Rash    FAMILY HISTORY:  family history includes Diabetes in his sister; Uterine cancer in his mother. SOCIAL HISTORY:  reports that he quit smoking about 41 years ago. His smoking use included Cigarettes. He has a 15 pack-year smoking history. He has never used smokeless tobacco. He reports that he drinks alcohol. He reports that he does not use illicit drugs.  REVIEW OF SYSTEMS:   As per HPI - All other systems reviewed and were neg.    SUBJECTIVE:   VITAL SIGNS: Temp:  [97.8 F (36.6 C)] 97.8 F (36.6 C) (06/05 1347) Pulse Rate:  [93-103] 94 (06/05 1645) Resp:  [16-34] 31 (06/05 1645) BP: (126-146)/(63-98) 133/74 mmHg (06/05 1645) SpO2:  [88 %-94 %] 90 % (06/05 1645)  PHYSICAL EXAMINATION: General:  Pleasant male, uncomfortable appearing seen in ER  Neuro:  Awake, alert, appropriate  HEENT:  Mm dry, no JVD  Cardiovascular:  Q3F3 +click Lungs:  resps even, mildly labored (baseline per pt), fine crackles diffusely with more coarse wet crackles bilat bases  Abdomen:  Round, soft, non tender  Musculoskeletal:  Warm and dry, 1+ BLE edema    Recent Labs Lab 10/15/14 1353  NA 138  K 3.5  CL 98*  CO2 31  BUN 6  CREATININE 0.51*  GLUCOSE 112*    Recent Labs Lab 10/15/14 1353  HGB 11.7*  HCT 36.4*  WBC 13.4*  PLT 408*   Dg Chest 2 View  10/15/2014   CLINICAL DATA:  Increasing shortness of breath and leg swelling over the last 3 days. History congestive heart failure.  EXAM: CHEST  2 VIEW  COMPARISON:  09/28/2014 and 09/18/2014 and 09/08/2013  FINDINGS: There is chronic cardiomegaly. There is progressive consolidation at both lung bases with diffuse bilateral pulmonary infiltrates, increasing. There are no appreciable effusions in the pulmonary vascularity  appears normal. No osseous abnormality. Previous median sternotomy. Previous aortic valve replacement. The no acute osseous abnormality.  IMPRESSION: Progressive diffuse bilateral pulmonary infiltrates. The possibility of ARDS superimposed on chronic lung disease should be considered.   Electronically Signed   By: Lorriane Shire M.D.   On: 10/15/2014 14:52    ASSESSMENT / PLAN:  Acute on chronic respiratory failure - r/t severe pulmonary fibrosis (UIP) +/- mild volume overload in setting combined heart failure UIP  ?acute on chronic combined CHF  S/p AVR  PLAN -  Admit tele  Lasix 40mg  x 1  F/u chem, BNP  PRN albuterol  Cont OFEV for now  F/u CXR in am  Coumadin per pharmacy  Cough suppression  No indication abx at this time  Hold off on steroids for now as well - wife very concerned about use of steroids as it relates to transplant w/u Will let Dr. Johnsie Cancel know pt is here   Dr. Chase Caller to see.   Nickolas Madrid, NP 10/15/2014  4:55 PM Pager: [phone] or [phone]

## 2014-10-15 NOTE — ED Provider Notes (Signed)
Patient here with worsening shortness of breath over the past 3 days. Has a history pulmonary fibrosis. Chest x-ray with diffuse infiltrates. The bnp is elevated. Will consult pulmonary Medical screening examination/treatment/procedure(s) were conducted as a shared visit with non-physician practitioner(s) and myself.  I personally evaluated the patient during the encounter.   EKG Interpretation   Date/Time:  Sunday October 15 2014 13:48:44 EDT Ventricular Rate:  93 PR Interval:  236 QRS Duration: 114 QT Interval:  408 QTC Calculation: 507 R Axis:   -24 Text Interpretation:  Sinus rhythm with 1st degree A-V block with  Premature atrial complexes Moderate voltage criteria for LVH, may be  normal variant Prolonged QT Abnormal ECG No significant change since last  tracing Confirmed by Zenia Resides  MD, Aymen Widrig ([id number]) on 10/15/2014 3:09:06 PM       Lacretia Leigh, MD 10/15/14 1549

## 2014-10-15 NOTE — Progress Notes (Signed)
Spoke with Dr. Chase Caller on floor. Stated to keep pt O2 sat over 80%. Pt O2 sat between 83-94 tonight so far on 6Lnc. O2 sat drops to 78% when pt started coughing/takes oxygen off to blow nose. Humidifier placed on oxygen. Pt states he is comfortable at this time.   Continuous pulse ox to monitor pt right now. Will continue to monitor.

## 2014-10-16 ENCOUNTER — Inpatient Hospital Stay (HOSPITAL_COMMUNITY): Payer: Medicare Other

## 2014-10-16 ENCOUNTER — Other Ambulatory Visit (HOSPITAL_COMMUNITY): Payer: Medicare Other

## 2014-10-16 LAB — HEPATIC FUNCTION PANEL
ALT: 28 U/L (ref 17–63)
AST: 38 U/L (ref 15–41)
Albumin: 2.9 g/dL — ABNORMAL LOW (ref 3.5–5.0)
Alkaline Phosphatase: 71 U/L (ref 38–126)
BILIRUBIN DIRECT: 0.2 mg/dL (ref 0.1–0.5)
BILIRUBIN INDIRECT: 0.5 mg/dL (ref 0.3–0.9)
Total Bilirubin: 0.7 mg/dL (ref 0.3–1.2)
Total Protein: 6.8 g/dL (ref 6.5–8.1)

## 2014-10-16 LAB — BASIC METABOLIC PANEL
Anion gap: 10 (ref 5–15)
BUN: 8 mg/dL (ref 6–20)
CHLORIDE: 96 mmol/L — AB (ref 101–111)
CO2: 32 mmol/L (ref 22–32)
CREATININE: 0.56 mg/dL — AB (ref 0.61–1.24)
Calcium: 8.7 mg/dL — ABNORMAL LOW (ref 8.9–10.3)
GFR calc Af Amer: >60 mL/min (ref >60)
GLUCOSE: 132 mg/dL — AB (ref 65–99)
Potassium: 3.5 mmol/L (ref 3.5–5.1)
SODIUM: 138 mmol/L (ref 135–145)

## 2014-10-16 LAB — PROTIME-INR
INR: 2.62 — AB (ref 0.00–1.49)
Prothrombin Time: 27.7 seconds — ABNORMAL HIGH (ref 11.6–15.2)

## 2014-10-16 LAB — MAGNESIUM: MAGNESIUM: 1.8 mg/dL (ref 1.7–2.4)

## 2014-10-16 LAB — TROPONIN I
Troponin I: 0.04 ng/mL — ABNORMAL HIGH (ref <0.031)
Troponin I: 0.04 ng/mL — ABNORMAL HIGH (ref <0.031)

## 2014-10-16 LAB — BRAIN NATRIURETIC PEPTIDE: B Natriuretic Peptide: 594.3 pg/mL — ABNORMAL HIGH (ref 0.0–100.0)

## 2014-10-16 LAB — PHOSPHORUS: Phosphorus: 4 mg/dL (ref 2.5–4.6)

## 2014-10-16 MED ORDER — MAGNESIUM SULFATE 2 GM/50ML IV SOLN
2.0000 g | Freq: Once | INTRAVENOUS | Status: AC
  Administered 2014-10-16: 2 g via INTRAVENOUS
  Filled 2014-10-16: qty 50

## 2014-10-16 MED ORDER — POTASSIUM CHLORIDE CRYS ER 20 MEQ PO TBCR
40.0000 meq | EXTENDED_RELEASE_TABLET | Freq: Two times a day (BID) | ORAL | Status: AC
  Administered 2014-10-16 (×2): 40 meq via ORAL
  Filled 2014-10-16 (×2): qty 2

## 2014-10-16 MED ORDER — WARFARIN SODIUM 10 MG PO TABS
10.0000 mg | ORAL_TABLET | ORAL | Status: DC
  Administered 2014-10-16: 10 mg via ORAL
  Filled 2014-10-16: qty 1

## 2014-10-16 MED ORDER — WARFARIN SODIUM 5 MG PO TABS
5.0000 mg | ORAL_TABLET | ORAL | Status: DC
  Filled 2014-10-16: qty 1

## 2014-10-16 MED ORDER — ALPRAZOLAM 0.25 MG PO TABS: 0.2500 mg | ORAL_TABLET | Freq: Two times a day (BID) | ORAL | Status: DC | PRN

## 2014-10-16 MED ORDER — FUROSEMIDE 10 MG/ML IJ SOLN
40.0000 mg | Freq: Once | INTRAMUSCULAR | Status: AC
  Administered 2014-10-16: 40 mg via INTRAVENOUS
  Filled 2014-10-16: qty 4

## 2014-10-16 MED ORDER — ALPRAZOLAM 0.25 MG PO TABS
0.2500 mg | ORAL_TABLET | Freq: Two times a day (BID) | ORAL | Status: DC
  Administered 2014-10-16 – 2014-10-17 (×3): 0.25 mg via ORAL
  Filled 2014-10-16 (×3): qty 1

## 2014-10-16 NOTE — Progress Notes (Signed)
Name: Dale [name] MRN: [id number] DOB: 1945/08/07    ADMISSION DATE:  10/15/2014  REFERRING MD :  EDP  CHIEF COMPLAINT:  Dyspnea   BRIEF PATIENT DESCRIPTION: 69yo male well known to Dr. Chase Caller in pulmonary clinic for pulmonary fibrosis (UIP, bx confirmed 2014) currently undergoing workup at [facility] for possible transplant. Also has hx nonischemic cardiomyopathy , aortic valve replacement on coumadin.  Presented 6/5 with 3 day hx increased SOB, hypoxia, fatigue, anorexia.  PCCM called to admit.   SIGNIFICANT EVENTS    STUDIES:  2D echo [facility] clinic) 09/05/14>>> EF 28%, mild MR, mod RV enlargement, L ventricular hypokinesis   SUBJECTIVE:   Feeling some better.  Still with BLE edema.  Very anxious when SOB or when he sees drop in O2 sats.   VITAL SIGNS: Temp:  [97.5 F (36.4 C)-98.1 F (36.7 C)] 98.1 F (36.7 C) (06/06 0615) Pulse Rate:  [93-109] 95 (06/06 0615) Resp:  [16-34] 21 (06/06 0615) BP: (106-146)/(63-98) 126/84 mmHg (06/06 0615) SpO2:  [78 %-94 %] 94 % (06/06 0928) Weight:  [181 lb 11.2 oz (82.419 kg)-182 lb 9.6 oz (82.827 kg)] 181 lb 11.2 oz (82.419 kg) (06/06 0615)  PHYSICAL EXAMINATION: General:  Pleasant male, NAD sitting on side of bed  Neuro:  Awake, alert, appropriate  HEENT:  Mm dry, no JVD  Cardiovascular:  Q5Z5 +click Lungs:  resps even, mildly labored (baseline per pt), fine crackles diffusely with few coarse crackles bilat bases  Abdomen:  Round, soft, non tender  Musculoskeletal:  Warm and dry, 1+ BLE edema    Recent Labs Lab 10/15/14 1353 10/16/14 0529  NA 138 138  K 3.5 3.5  CL 98* 96*  CO2 31 32  BUN 6 8  CREATININE 0.51* 0.56*  GLUCOSE 112* 132*    Recent Labs Lab 10/15/14 1353  HGB 11.7*  HCT 36.4*  WBC 13.4*  PLT 408*   Dg Chest 2 View  10/15/2014   CLINICAL DATA:  Increasing shortness of breath and leg swelling over the last 3 days. History congestive heart failure.  EXAM: CHEST  2 VIEW  COMPARISON:  09/28/2014 and  09/18/2014 and 09/08/2013  FINDINGS: There is chronic cardiomegaly. There is progressive consolidation at both lung bases with diffuse bilateral pulmonary infiltrates, increasing. There are no appreciable effusions in the pulmonary vascularity appears normal. No osseous abnormality. Previous median sternotomy. Previous aortic valve replacement. The no acute osseous abnormality.  IMPRESSION: Progressive diffuse bilateral pulmonary infiltrates. The possibility of ARDS superimposed on chronic lung disease should be considered.   Electronically Signed   By: Lorriane Shire M.D.   On: 10/15/2014 14:52    ASSESSMENT / PLAN:  Acute on chronic respiratory failure - r/t severe pulmonary fibrosis (UIP) +/- mild volume overload in setting combined heart failure.  OFEV intolerance r/t GI s/s.  UIP  acute on chronic combined CHF/decompensated cor pulmonale - EF 28% S/p AVR Anxiety   PLAN -  Repeat Lasix 40mg  x 1  Potassium 15mEq x 2 F/u chem, BNP  PRN albuterol  Cont hold OFEC for now  F/u CXR pending  Coumadin per pharmacy  LOW dose xanax PRN Cough suppression  No indication abx at this time  Hold off on steroids for now as well - wife very concerned about use of steroids as it relates to transplant w/u Awaiting decision from [facility] clinic regarding transplant  D/w Dr. Derry Skill, NP 10/16/2014  10:57 AM Pager: [phone] or [phone]

## 2014-10-16 NOTE — Telephone Encounter (Addendum)
Dale [name] is inpatient. Wife wants refill on his 2 opiopid Rx scripts - hycodan syrup and pain pill. Can you do those please 10/16/2014 . I orginally told wife that I Can bring it but n 2nd thoughts due to controlled subsance and lack of security best she picks it up 10/17/14   Thanks  Dr. Brand Males, M.D., [facility].C.P Pulmonary and Critical Care Medicine Staff Physician [facility] Pager: [phone], If no answer or between  15:00h - 7:00h: call [phone]  10/16/2014 11:37 AM

## 2014-10-16 NOTE — Progress Notes (Signed)
UR COMPLETED  

## 2014-10-16 NOTE — Progress Notes (Signed)
ANTICOAGULATION CONSULT NOTE - Follow Up Consult  Pharmacy Consult for Coumadin Indication: Mechanical heart valve  Allergies  Allergen Reactions  . Penicillins Swelling and Rash    Patient Measurements: Height: 5\' 7"  (170.2 cm) Weight: 181 lb 11.2 oz (82.419 kg) IBW/kg (Calculated) : 66.1 Heparin Dosing Weight:    Vital Signs: Temp: 98.1 F (36.7 C) (06/06 0615) Temp Source: Oral (06/06 0615) BP: 126/84 mmHg (06/06 0615) Pulse Rate: 95 (06/06 0615)  Labs:  Recent Labs  10/15/14 1353 10/15/14 1504 10/15/14 1905 10/15/14 2254 10/16/14 0529  HGB 11.7*  --   --   --   --   HCT 36.4*  --   --   --   --   PLT 408*  --   --   --   --   LABPROT  --  27.5*  --   --  27.7*  INR  --  2.61*  --   --  2.62*  CREATININE 0.51*  --   --   --  0.56*  TROPONINI  --   --  0.05* 0.04* 0.04*    Estimated Creatinine Clearance: 93.3 mL/min (by C-G formula based on Cr of 0.56).  Assessment: 69 yo, end stage IPF, lung transplant workup, admit with SOB. On Coumadin for St. Jude's aortic valve.  Anticoag: St jude's aortic valve - INR 2.62 Warfarin 10 mg on MWF, 5 mg on TThSatSun  Goal of Therapy:  INR 2-3 Monitor platelets by anticoagulation protocol: Yes   Plan:  Continue home regimen Daily INR   Estevon Fluke S. Alford Highland, PharmD, BCPS Clinical Staff Pharmacist Pager [phone]  Eilene Ghazi Stillinger 10/16/2014,10:46 AM

## 2014-10-17 LAB — PROTIME-INR
INR: 3.13 — ABNORMAL HIGH (ref 0.00–1.49)
Prothrombin Time: 31.6 seconds — ABNORMAL HIGH (ref 11.6–15.2)

## 2014-10-17 LAB — PROCALCITONIN

## 2014-10-17 LAB — PHOSPHORUS: Phosphorus: 3.6 mg/dL (ref 2.5–4.6)

## 2014-10-17 LAB — MAGNESIUM: Magnesium: 2 mg/dL (ref 1.7–2.4)

## 2014-10-17 MED ORDER — HYDROCOD POLST-CPM POLST ER 10-8 MG/5ML PO SUER: 5.0000 mL | Freq: Two times a day (BID) | ORAL | Status: AC | PRN

## 2014-10-17 MED ORDER — OMEPRAZOLE 40 MG PO CPDR: 40.0000 mg | DELAYED_RELEASE_CAPSULE | Freq: Every day | ORAL | Status: AC

## 2014-10-17 MED ORDER — HYDROCODONE-ACETAMINOPHEN 10-325 MG PO TABS: 1.0000 | ORAL_TABLET | Freq: Two times a day (BID) | ORAL | Status: AC | PRN

## 2014-10-17 MED ORDER — WARFARIN SODIUM 10 MG PO TABS: 10.0000 mg | ORAL_TABLET | ORAL | Status: AC

## 2014-10-17 MED ORDER — IPRATROPIUM-ALBUTEROL 0.5-2.5 (3) MG/3ML IN SOLN: 3.0000 mL | Freq: Four times a day (QID) | RESPIRATORY_TRACT | Status: DC | PRN

## 2014-10-17 MED ORDER — ALPRAZOLAM 0.25 MG PO TABS: 0.2500 mg | ORAL_TABLET | Freq: Two times a day (BID) | ORAL | Status: AC

## 2014-10-17 MED ORDER — WARFARIN SODIUM 5 MG PO TABS: 5.0000 mg | ORAL_TABLET | ORAL | Status: AC

## 2014-10-17 MED ORDER — FUROSEMIDE 20 MG PO TABS: 20.0000 mg | ORAL_TABLET | Freq: Every day | ORAL | Status: AC

## 2014-10-17 MED ORDER — POTASSIUM CHLORIDE ER 10 MEQ PO TBCR: 10.0000 meq | EXTENDED_RELEASE_TABLET | Freq: Every day | ORAL | Status: AC

## 2014-10-17 NOTE — Discharge Summary (Signed)
Physician Discharge Summary  Patient ID: Dale [name] MRN: [id number] DOB/AGE: 07/05/1945 69 y.o.  Admit date: 10/15/2014 Discharge date: 10/17/2014    Discharge Diagnoses:  Severe Pulmonary Fibrosis Acute on Chronic Respiratory Failure secondary to UIP Dyspnea  Cough Acute on Chronic Diastolic Dysfunction / Cor Pulmonale  Hx AVR on Chronic Anticoagulation HTN Nausea / Vomiting  Hypokalemia  Hypomagnesemia Anxiety                                                                        DISCHARGE PLAN BY DIAGNOSIS     Severe Pulmonary Fibrosis Acute on Chronic Respiratory Failure secondary to UIP Dyspnea  Cough  Discharge Plan: Discontinue OFEV PRN Duoneb as pt feels it helps with cough Arrange for home nebulizer  Continue oxygen @ 6L at rest, 8-10L with exertion  Follow up with Pulmonary as below Tussionex for cough   Acute on Chronic Diastolic Dysfunction / Cor Pulmonale  Hx AVR on Chronic Anticoagulation HTN  Discharge Plan: Hold Hyzaar (normal BP at d/c with addition of lasix) Lasix 20 mg QD + 10 mEq KCL  Resume coumadin as previously ordered Follow up with PCP for INR on 6/10 Hold NSAIDS with decompensated HF  Nausea / Vomiting   Discharge Plan: Resolved, in setting of OFEV.  No further interventions at this time.   Hypokalemia  Hypomagnesemia  Discharge Plan: Follow up BMP in office to review electrolytes with initiation of lasix   Anxiety  Chronic Pain   Discharge Plan: Low dose xanax 0.25 BID (new Rx) Resume Norco                  DISCHARGE SUMMARY   Dale [name] is a 69 y.o. y/o male, former smoker, with a PMH of St. Jude AVR (1996) on chronic anticoagulation, LBBB, Non-Ischemic DCM with EF 35%, HTN, GERD, Arthritis, progressive cough, O2 dependent (6-8L) ILD (biopsy proven UIP in 03/2013) followed at the [facility] for possible transplant & Dr. Chase Caller who presented to [facility] ER on 10/15/14 with nausea, vomiting, anorexia, and fatigue  thought to be related to [place].  He also reported increased dyspnea and BLE swelling.  He recently (09/2014) underwent a LHC with clean coronaries.  He has been actively seeking transplant and is waiting to hear from the [facility] regarding decision.    The patient was admitted for further evaluation of acute on chronic respiratory failure.  Initial exam was consistent with decompensated cor pulmonale.  He was treated with IV lasix, PRN albuterol and cough suppression.  Procalcaition and lactic acid were negative. OFEV was stopped and nausea / vomiting resolved.  He improved significantly with diuresis and cough suppression.  O2 was titrated for saturations > 80%.  Home coumadin dosing was continued.  Anti-hypertensive regimen was held on admission (not restarted with addition of lasix).  The patient was medically cleared for discharge 6/7 with plans as above.     SIGNIFICANT DIAGNOSTIC STUDIES 4/26  ECHO ([facility]) >> EF 28%, mild MR, mod RV enlargement, L ventricular hypokinesis    Discharge Exam: General: chronically ill appearing male, dyspneic at rest Neuro: AAOx4, speech clear, MAE CV: s1s2 rrr PULM: mild dyspnea at rest, prolonged exp phase, lungs bilaterally with Velcro  crackles 2/3 way up GI: NTND, BSx4 active  Extremities: warm/dry, no edema   Filed Vitals:   10/16/14 2100 10/17/14 0536 10/17/14 0907 10/17/14 1053  BP: 129/75 138/79  135/78  Pulse: 95 98  100  Temp:  98.2 F (36.8 C)    TempSrc:  Oral    Resp: $Remo'18 24  22  'aKNbx$ Height:      Weight:  182 lb 8 oz (82.781 kg)    SpO2: 90% 85% 86% 93%     Discharge Labs  BMET  Recent Labs Lab 10/15/14 1353 10/16/14 0529 10/17/14 0618  NA 138 138  --   K 3.5 3.5  --   CL 98* 96*  --   CO2 31 32  --   GLUCOSE 112* 132*  --   BUN 6 8  --   CREATININE 0.51* 0.56*  --   CALCIUM 8.8* 8.7*  --   MG  --  1.8 2.0  PHOS  --  4.0 3.6    CBC  Recent Labs Lab 10/15/14 1353  HGB 11.7*  HCT 36.4*  WBC 13.4*  PLT 408*     Anti-Coagulation  Recent Labs Lab 10/15/14 1504 10/16/14 0529 10/17/14 0618  INR 2.61* 2.62* 3.13*    Discharge Instructions    Call MD for:  difficulty breathing, headache or visual disturbances    Complete by:  As directed      Call MD for:  extreme fatigue    Complete by:  As directed      Call MD for:  hives    Complete by:  As directed      Call MD for:  persistant dizziness or light-headedness    Complete by:  As directed      Call MD for:  persistant nausea and vomiting    Complete by:  As directed      Call MD for:  redness, tenderness, or signs of infection (pain, swelling, redness, odor or green/yellow discharge around incision site)    Complete by:  As directed      Call MD for:  severe uncontrolled pain    Complete by:  As directed      Call MD for:  temperature >100.4    Complete by:  As directed      Diet - low sodium heart healthy    Complete by:  As directed      Discharge instructions    Complete by:  As directed   1.  Review your medications carefully as they have changed 2.  Follow up as arranged  3.  Continue to wear your oxygen at 6L     Increase activity slowly    Complete by:  As directed                 Follow-up Information    Follow up with Maricela Curet, MD. Schedule an appointment as soon as possible for a visit on 10/20/2014.   Specialty:  Internal Medicine   Why:  Appt at 8:00 AM for INR   Contact information:   [address] [place] [address] [phone]       Follow up with [facility], MD On 11/01/2014.   Specialty:  Pulmonary Disease   Why:  Appt at 10:15    Contact information:   [place] [address] [phone]       Follow up with PARRETT,[name], NP On 10/23/2014.   Specialty:  Nurse Practitioner   Why:  Appt at 11:45 at [facility] for hospital follow up   Contact information:   [address] [place] [address] [phone]          Medication List    STOP  taking these medications        celecoxib 200 MG capsule  Commonly known as:  CELEBREX     losartan-hydrochlorothiazide 50-12.5 MG per tablet  Commonly known as:  HYZAAR     naproxen 500 MG tablet  Commonly known as:  NAPROSYN     Nintedanib 150 MG Caps  Commonly known as:  OFEV      TAKE these medications        ALPRAZolam 0.25 MG tablet  Commonly known as:  XANAX  Take 1 tablet (0.25 mg total) by mouth 2 (two) times daily.     aspirin EC 81 MG tablet  Take 81 mg by mouth daily.     atorvastatin 80 MG tablet  Commonly known as:  LIPITOR  Take 1 tablet (80 mg total) by mouth daily.     chlorpheniramine-HYDROcodone 10-8 MG/5ML Suer  Commonly known as:  TUSSIONEX PENNKINETIC ER  Take 5 mLs by mouth 2 (two) times daily as needed for cough.     fluticasone 50 MCG/ACT nasal spray  Commonly known as:  FLONASE  Place 2 sprays into both nostrils daily.     furosemide 20 MG tablet  Commonly known as:  LASIX  Take 1 tablet (20 mg total) by mouth daily.     HYDROcodone-acetaminophen 10-325 MG per tablet  Commonly known as:  NORCO  Take 1 tablet by mouth 2 (two) times daily as needed.     ipratropium-albuterol 0.5-2.5 (3) MG/3ML Soln  Commonly known as:  DUONEB  Take 3 mLs by nebulization every 6 (six) hours as needed.     multivitamin with minerals Tabs tablet  Take 1 tablet by mouth daily.     omeprazole 40 MG capsule  Commonly known as:  PRILOSEC  Take 1 capsule (40 mg total) by mouth daily.     potassium chloride 10 MEQ tablet  Commonly known as:  K-DUR  Take 1 tablet (10 mEq total) by mouth daily.     VENTOLIN HFA 108 (90 BASE) MCG/ACT inhaler  Generic drug:  albuterol  INHALE 2 PUFFS INTO THE LUNGS EVERY 6 HOURS AS NEEDED FOR WHEEZING.     Vortioxetine HBr 10 MG Tabs  Take 10 mg by mouth daily. BRINTELLIX     warfarin 10 MG tablet  Commonly known as:  COUMADIN  Take 1 tablet (10 mg total) by mouth every Monday, Wednesday, and Friday at 6 PM.      warfarin 5 MG tablet  Commonly known as:  COUMADIN  Take 1 tablet (5 mg total) by mouth every Tuesday, Thursday, Saturday, and Sunday at 6 PM.          Disposition:  Home.  Resume home O2 as previously arranged.  Home nebulizer arranged at patients request.   Discharged Condition: Dale [name] has met maximum benefit of inpatient care and is medically stable and cleared for discharge.  Patient is pending follow up as above.      Time spent on disposition:  Greater than 35 minutes.   Signed: Noe Gens, NP-C [place] [facility] Pgr: [phone] Office: [phone]

## 2014-10-17 NOTE — Progress Notes (Signed)
Patient discharged home.  Discharge instructions completed.

## 2014-10-17 NOTE — Progress Notes (Addendum)
ANTICOAGULATION CONSULT NOTE - Follow Up Consult  Pharmacy Consult for Coumadin Indication: Mechanical heart valve  Allergies  Allergen Reactions  . Penicillins Swelling and Rash    Patient Measurements: Height: 5\' 7"  (170.2 cm) Weight: 182 lb 8 oz (82.781 kg) IBW/kg (Calculated) : 66.1 Heparin Dosing Weight:    Vital Signs: Temp: 98.2 F (36.8 C) (06/07 0536) Temp Source: Oral (06/07 0536) BP: 135/78 mmHg (06/07 1053) Pulse Rate: 100 (06/07 1053)  Labs:  Recent Labs  10/15/14 1353 10/15/14 1504 10/15/14 1905 10/15/14 2254 10/16/14 0529 10/17/14 0618  HGB 11.7*  --   --   --   --   --   HCT 36.4*  --   --   --   --   --   PLT 408*  --   --   --   --   --   LABPROT  --  27.5*  --   --  27.7* 31.6*  INR  --  2.61*  --   --  2.62* 3.13*  CREATININE 0.51*  --   --   --  0.56*  --   TROPONINI  --   --  0.05* 0.04* 0.04*  --     Estimated Creatinine Clearance: 93.5 mL/min (by C-G formula based on Cr of 0.56).  Assessment: 69 yo, end stage IPF, lung transplant workup, admit with SOB. On Coumadin for St. Jude's aortic valve.  Anticoag: St jude's aortic valve - INR 2.62>3.13 today. Warfarin 10 mg on MWF, 5 mg on TThSatSun  Goal of Therapy:  INR 2.5-3.5 confirmed with pt and wife Monitor platelets by anticoagulation protocol: Yes   Plan:  Continue home regimen Daily INR   Melvinia Ashby S. Alford Highland, PharmD, BCPS Clinical Staff Pharmacist Pager [phone]  [place], [place] 10/17/2014,11:07 AM

## 2014-10-17 NOTE — Discharge Instructions (Signed)

## 2014-10-17 NOTE — Progress Notes (Signed)
  Pharmacy Discharge Medication Therapy Review   Total Number of meds on admission _____14____ (polypharmacy > 10 meds)  Indications for all medications: [x]  Yes       []  No  Adherence Review  []  Excellent (no doses missed/week)     []  Good (no more than 1 dose missed/week)     []  Partial (2-3 doses missed/week)     []  Poor (>3 doses missed/week)     [x]  Not assessed  Total number of high risk medications _2__ (Anticoagulants, Dual antiplatelets, oral Antihyperglycemic agents,Insulins, Antipsychotics, Anti-Seizure meds, Inhalers, HF/ACS meds, Antibiotics and HIV medications)   Assessment: (Medication related problems)  Intervention  YES NO  Explanation   Indications      Medication without noted indication []  [x]     Indication without noted medication []  [x]     Duplicate therapy []  [x]    Efficacy      Suboptimal drug or dose selection [x]  []  45 YOM w/ 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  Echo 08/02/2013 EF 30-35%.   PTA patient on celecoxib & naproxen.  Given the increased cardiovascular risks, potential worsening fluid retention/heart failure and renal dysfunction, consider discontinuing celecoxib and naproxen.    Of note, if patient insists on pain would consider a non-NSAID regimen.  However, if NSAID is indicated in the future, naproxen has not shown increased risk of major cardiovascular events in previous studies and would be the preferred agent.    Insufficient dose/duration []  [x]    Failure to receive therapy  (Rx not filled) []  [x]     Safety      Adverse drug event []  [x]     Drug interaction [x]  []  NSAIDs & Warfarin   Excessive dose/duration []  [x]    High-risk medications []  [x]    Compliance     Underuse []  [x]     Overuse []  [x]    Other pertinent pharmacist counseling []  [x]      Total number of new medications upon discharge: ____3_____  Time:  Time spent preparing for discharge counseling: 15  min. Time spent counseling patient: 0  Additional time spent on discharge (specify): 5 min.  PLAN:  Consider the following at discharge/Recommendations discussed with provider  - DC naproxen and celecoxib (communicated to Noe Gens, NP)  Hassie Bruce, Pharm. D. Clinical Pharmacy Resident Pager: [phone] Ph: [phone] 10/17/2014 10:40 AM

## 2014-10-18 ENCOUNTER — Encounter: Payer: Self-pay | Admitting: Internal Medicine

## 2014-10-18 MED ORDER — IPRATROPIUM-ALBUTEROL 0.5-2.5 (3) MG/3ML IN SOLN: 3.0000 mL | Freq: Four times a day (QID) | RESPIRATORY_TRACT | Status: AC | PRN

## 2014-10-20 ENCOUNTER — Telehealth: Payer: Self-pay | Admitting: Internal Medicine

## 2014-10-20 NOTE — Telephone Encounter (Signed)
Called and spoke to [place], pt's niece. Pt is traveling to [facility] on Monday 6/13 and are needing the pt's most recent HRCT. Called [facility] and spoke to Romulus and was advised they can have the disc ready this afternoon. Called Olivia Mackie back and informed her of the disc being ready today. Olivia Mackie verbalized understanding and denied any further questions or concerns at this time.

## 2014-10-23 ENCOUNTER — Inpatient Hospital Stay: Payer: Medicare Other | Admitting: Adult Health

## 2014-10-27 ENCOUNTER — Encounter: Payer: Self-pay | Admitting: Cardiovascular Disease

## 2014-11-01 ENCOUNTER — Ambulatory Visit: Payer: Medicare Other | Admitting: Internal Medicine

## 2014-11-07 ENCOUNTER — Encounter: Payer: Self-pay | Admitting: Internal Medicine

## 2014-11-08 ENCOUNTER — Telehealth: Payer: Self-pay | Admitting: Internal Medicine

## 2014-11-08 NOTE — Telephone Encounter (Signed)
Saw note from [facility] . Patient admitted there 6/14.16 and died November 12, 2014 [name] . Will call his wife. Not sure what happened - they said they were going to [facility]

## 2014-11-09 NOTE — Telephone Encounter (Signed)
Spoke to wife: expeseed my condolences. [place] called and declined so they went to [facility] and there he died

## 2014-11-10 DEATH — deceased

## 2014-11-30 ENCOUNTER — Ambulatory Visit: Payer: Medicare Other | Admitting: Cardiovascular Disease

## 2016-05-09 IMAGING — CT CT CHEST HIGH RESOLUTION W/O CM
2 of 6 series · 12 of 36 positions shown, 15 images · non-contrast
Comparison: Chest CT 02/28/2013.

CLINICAL DATA: 68-year-old male with chronic shortness of breath
with exertion, intermittently with decreased oxygen saturation and
increased mucus production. Evaluate for interstitial lung disease.

EXAM:
CT CHEST WITHOUT CONTRAST
TECHNIQUE: Multidetector CT imaging of the chest was performed following the
standard protocol without intravenous contrast. High resolution
imaging of the lungs, as well as inspiratory and expiratory imaging,
was performed.

[Series 5: lung · axial · 0.74mm/px · z∈[-246,-32]mm · 9 of 55 slices shown, 12 images]
[im 6/55  mediastinal]
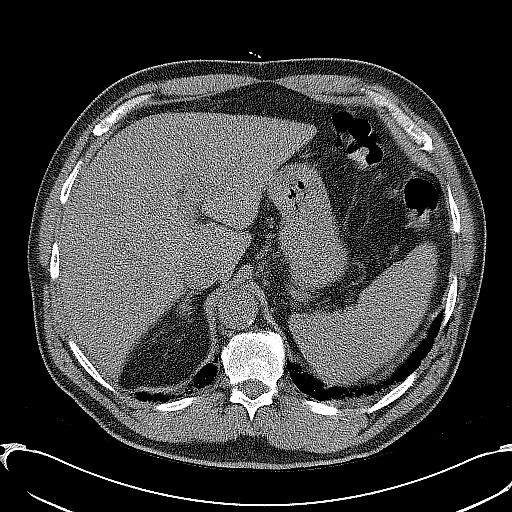
[im 6/55  lung]
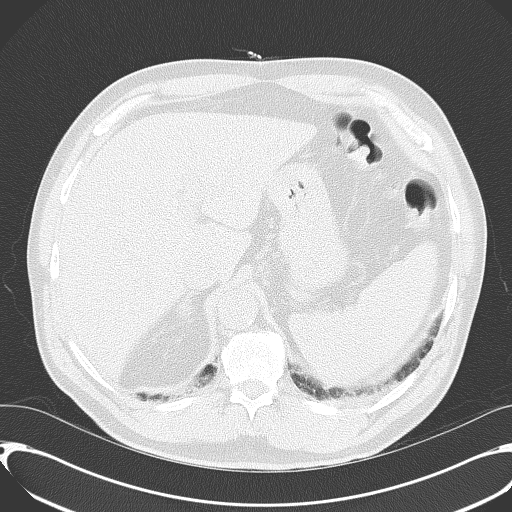
[im 11/55  lung]
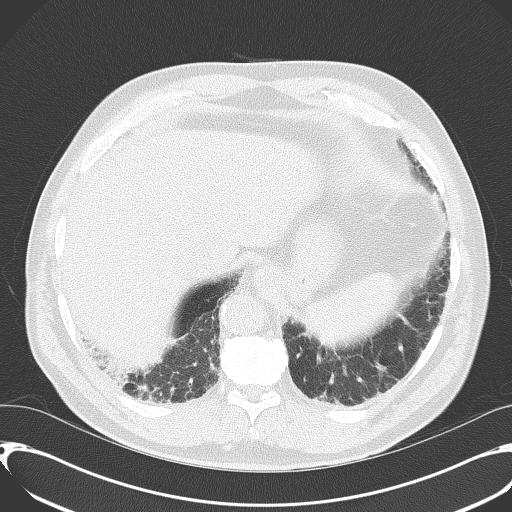
[im 17/55  lung]
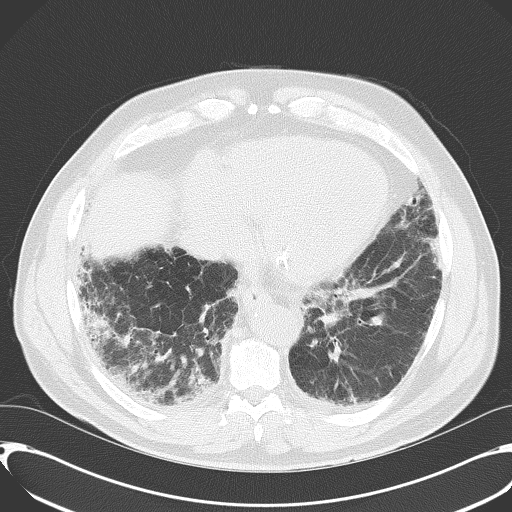
[im 22/55  lung]
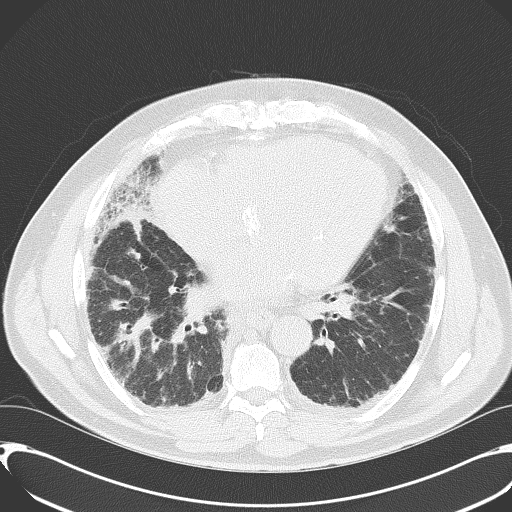
[im 28/55  mediastinal]
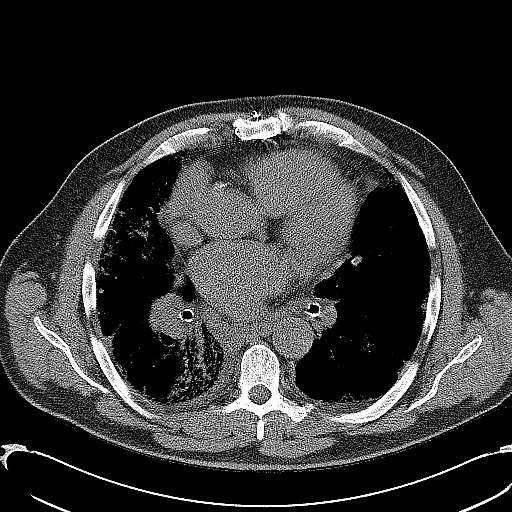
[im 28/55  lung]
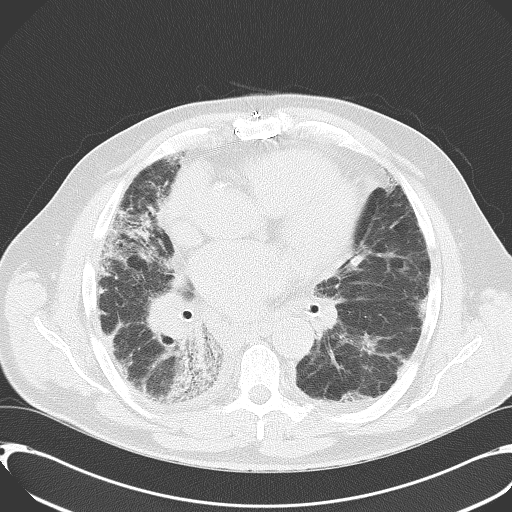
[im 33/55  lung]
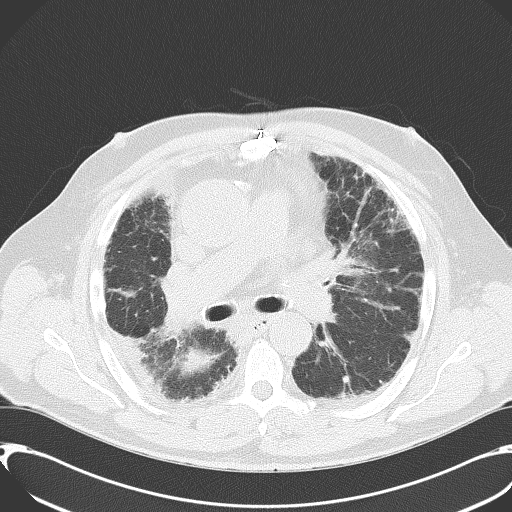
[im 38/55  lung]
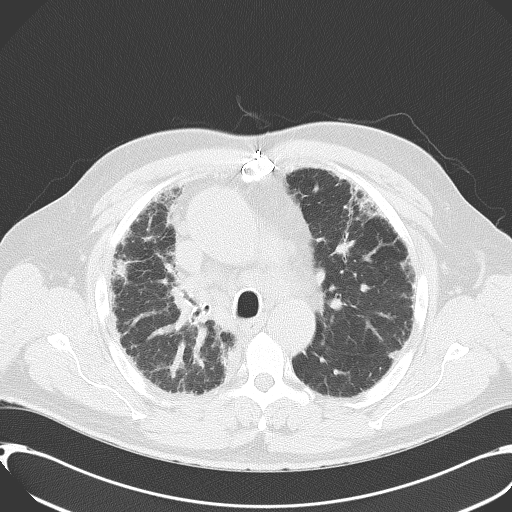
[im 44/55  lung]
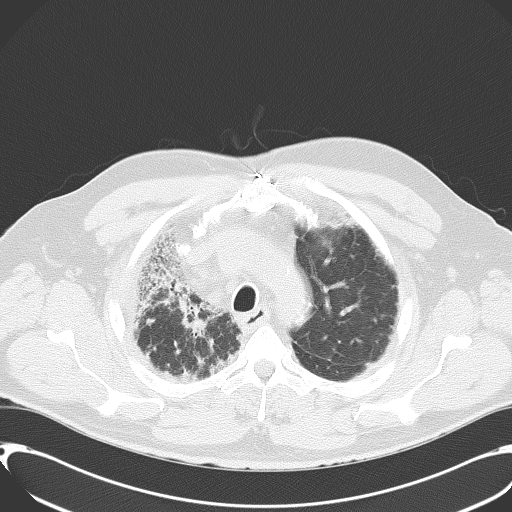
[im 49/55  mediastinal]
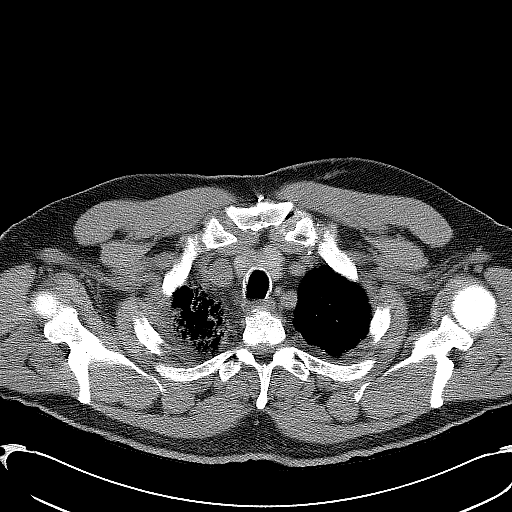
[im 49/55  lung]
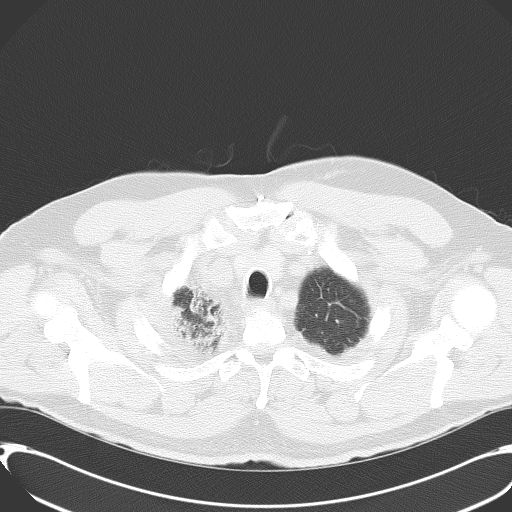

[Series 602: cor · coronal · 0.74mm/px · 3 of 125 slices shown]
[im 25/125  lung]
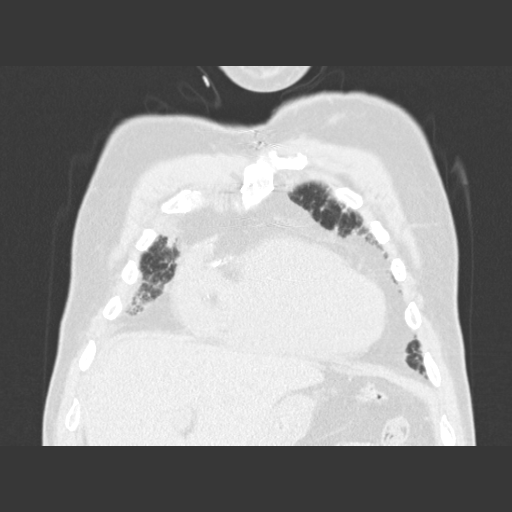
[im 50/125  lung]
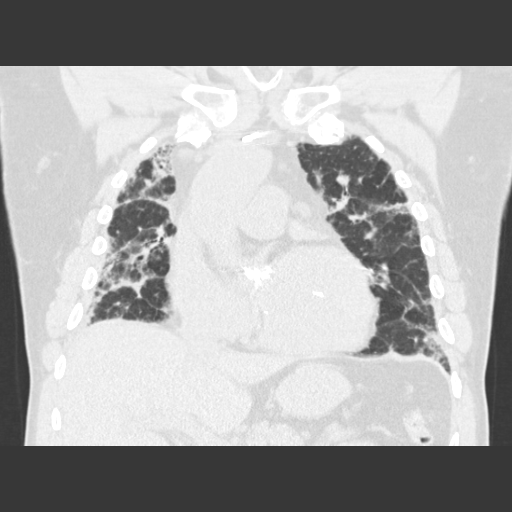
[im 75/125  lung]
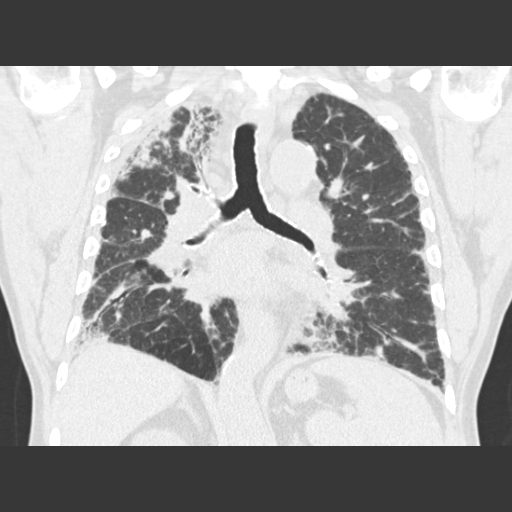

[12 of 36 positions shown; findings below may reference images not displayed]

FINDINGS: Mediastinum: Heart size is mildly enlarged. There is no significant
pericardial fluid, thickening or pericardial calcification. There is
atherosclerosis of the thoracic aorta, the great vessels of the
mediastinum and the coronary arteries, including calcified
atherosclerotic plaque in the left anterior descending, left
circumflex and right coronary arteries. Status post median
sternotomy for CABG and aortic valve replacement with they
mechanical aortic valve. Aneurysmal dilatation of the ascending
thoracic aorta measuring 5.1 cm in diameter (previously 5.0 cm in
diameter on 02/28/2013). Multiple borderline enlarged and mildly
enlarged mediastinal and bilateral hilar lymph nodes measuring up to
13 mm in short axis in the low left paratracheal station. These
findings are similar, but slightly increased compared to the prior
examination. Esophagus is unremarkable in appearance.

Lungs/Pleura: Again noted are patchy areas of peripheral and
peribronchovascular predominant ground-glass attenuation and
reticulation scattered throughout the lungs bilaterally (right
greater than left). No definite craniocaudal gradient. Scattered
areas of parenchymal banding with mild cylindrical traction
bronchiectasis. No frank macrocystic honeycombing identified.
Several areas of probable microcystic honeycombing are noted,
similar to the prior study. The severity of disease is very similar
to the prior examination with minimal progression compared to the
prior study. Inspiratory and expiratory imaging demonstrates
scattered areas of air trapping, predominantly in the lung bases,
indicative of mild small airways disease. No confluent consolidative
airspace disease. No pleural effusions. Some chronic right-sided
pleural thickening is noted. A small amount of right-sided pleural
calcification is also noted adjacent to the right upper and right
middle lobes, likely from prior open lung biopsy. No left-sided
pleural calcification.

Upper Abdomen: Unremarkable.

Musculoskeletal: Median sternotomy wires. There are no aggressive
appearing lytic or blastic lesions noted in the visualized portions
of the skeleton.
IMPRESSION: 1. The appearance of the lungs is very similar to the prior
examination. Again, the pattern is favored to reflect fibrotic phase
nonspecific interstitial pneumonia (NSIP), due to the lack of frank
macrocystic honeycombing and lack of a definitive craniocaudal
gradient. However, this slight progression of findings compared to
the prior study does raise a possibility of an unusual presentation
of usual interstitial pneumonia (UIP).
2. Progressively increasing mediastinal and bilateral hilar
lymphadenopathy is presumably chronic related to underlying
interstitial lung disease.
3. Status post mechanical aortic valve replacement, with slight
increased size of what is now a 5.1 cm ascending thoracic aortic
aneurysm. Recommend semi-annual imaging followup by CTA or MRA and
referral to cardiothoracic surgery if not already obtained. This
recommendation follows 3626
ACCF/AHA/AATS/ACR/ASA/SCA/MARISTELA/NSAVYIMANA/JORGELUIS/SIGIFREDO Guidelines for the
Diagnosis and Management of Patients With Thoracic Aortic Disease.
Circulation. 3626; 121: [id number].
4. Atherosclerosis, including 3 vessel coronary artery disease.
Please note that although the presence of coronary artery calcium
documents the presence of coronary artery disease, the severity of
this disease and any potential stenosis cannot be assessed on this
non-gated CT examination. Assessment for potential risk factor
modification, dietary therapy or pharmacologic therapy may be
warranted, if clinically indicated.
